# Patient Record
Sex: Female | Born: 1975 | Race: White | Hispanic: No | Marital: Married | Smoking: Former smoker
Health system: Southern US, Community
[De-identification: ages and names within clinical notes are randomized; demographics above are authoritative.]

## PROBLEM LIST (undated history)

## (undated) DIAGNOSIS — K219 Gastro-esophageal reflux disease without esophagitis: Secondary | ICD-10-CM

## (undated) DIAGNOSIS — K760 Fatty (change of) liver, not elsewhere classified: Secondary | ICD-10-CM

## (undated) DIAGNOSIS — I82409 Acute embolism and thrombosis of unspecified deep veins of unspecified lower extremity: Secondary | ICD-10-CM

## (undated) DIAGNOSIS — N200 Calculus of kidney: Secondary | ICD-10-CM

## (undated) DIAGNOSIS — R112 Nausea with vomiting, unspecified: Secondary | ICD-10-CM

## (undated) DIAGNOSIS — N644 Mastodynia: Secondary | ICD-10-CM

## (undated) DIAGNOSIS — R002 Palpitations: Secondary | ICD-10-CM

## (undated) DIAGNOSIS — J301 Allergic rhinitis due to pollen: Secondary | ICD-10-CM

## (undated) DIAGNOSIS — Z9889 Other specified postprocedural states: Secondary | ICD-10-CM

## (undated) HISTORY — PX: BREAST BIOPSY: SHX20

## (undated) HISTORY — DX: Palpitations: R00.2

## (undated) HISTORY — DX: Allergic rhinitis due to pollen: J30.1

## (undated) HISTORY — DX: Calculus of kidney: N20.0

## (undated) HISTORY — PX: TONSILLECTOMY: SUR1361

## (undated) HISTORY — PX: WISDOM TOOTH EXTRACTION: SHX21

## (undated) HISTORY — DX: Mastodynia: N64.4

## (undated) HISTORY — DX: Fatty (change of) liver, not elsewhere classified: K76.0

## (undated) HISTORY — DX: Gastro-esophageal reflux disease without esophagitis: K21.9

## (undated) HISTORY — DX: Acute embolism and thrombosis of unspecified deep veins of unspecified lower extremity: I82.409

---

## 2001-10-04 ENCOUNTER — Inpatient Hospital Stay (HOSPITAL_COMMUNITY): Admission: AD | Admit: 2001-10-04 | Discharge: 2001-10-07 | Payer: Self-pay | Admitting: Obstetrics & Gynecology

## 2002-12-29 ENCOUNTER — Other Ambulatory Visit: Admission: RE | Admit: 2002-12-29 | Discharge: 2002-12-29 | Payer: Self-pay | Admitting: Obstetrics & Gynecology

## 2004-04-06 ENCOUNTER — Inpatient Hospital Stay (HOSPITAL_COMMUNITY): Admission: AD | Admit: 2004-04-06 | Discharge: 2004-04-06 | Payer: Self-pay | Admitting: Obstetrics and Gynecology

## 2004-04-09 ENCOUNTER — Inpatient Hospital Stay (HOSPITAL_COMMUNITY): Admission: AD | Admit: 2004-04-09 | Discharge: 2004-04-10 | Payer: Self-pay | Admitting: Obstetrics & Gynecology

## 2004-04-13 ENCOUNTER — Inpatient Hospital Stay (HOSPITAL_COMMUNITY): Admission: AD | Admit: 2004-04-13 | Discharge: 2004-04-14 | Payer: Self-pay | Admitting: Obstetrics and Gynecology

## 2004-05-20 ENCOUNTER — Other Ambulatory Visit: Admission: RE | Admit: 2004-05-20 | Discharge: 2004-05-20 | Payer: Self-pay | Admitting: Obstetrics and Gynecology

## 2004-08-23 ENCOUNTER — Ambulatory Visit: Payer: Self-pay | Admitting: Internal Medicine

## 2004-10-08 ENCOUNTER — Ambulatory Visit: Payer: Self-pay | Admitting: Family Medicine

## 2004-11-29 ENCOUNTER — Ambulatory Visit: Payer: Self-pay | Admitting: Internal Medicine

## 2005-08-05 ENCOUNTER — Ambulatory Visit: Payer: Self-pay | Admitting: Family Medicine

## 2006-02-08 ENCOUNTER — Emergency Department: Payer: Self-pay | Admitting: Emergency Medicine

## 2006-02-27 ENCOUNTER — Ambulatory Visit: Payer: Self-pay | Admitting: Family Medicine

## 2006-07-12 ENCOUNTER — Emergency Department: Payer: Self-pay | Admitting: Emergency Medicine

## 2006-09-24 ENCOUNTER — Telehealth (INDEPENDENT_AMBULATORY_CARE_PROVIDER_SITE_OTHER): Payer: Self-pay | Admitting: *Deleted

## 2007-03-25 ENCOUNTER — Ambulatory Visit: Payer: Self-pay | Admitting: Gynecology

## 2007-04-15 ENCOUNTER — Ambulatory Visit: Payer: Self-pay | Admitting: Obstetrics & Gynecology

## 2007-04-15 ENCOUNTER — Encounter: Payer: Self-pay | Admitting: Obstetrics & Gynecology

## 2007-05-05 DIAGNOSIS — L259 Unspecified contact dermatitis, unspecified cause: Secondary | ICD-10-CM

## 2007-05-07 ENCOUNTER — Ambulatory Visit: Payer: Self-pay | Admitting: Family Medicine

## 2007-05-14 ENCOUNTER — Ambulatory Visit: Payer: Self-pay | Admitting: Family Medicine

## 2007-05-14 DIAGNOSIS — F17200 Nicotine dependence, unspecified, uncomplicated: Secondary | ICD-10-CM

## 2007-12-19 ENCOUNTER — Emergency Department: Payer: Self-pay | Admitting: Emergency Medicine

## 2008-04-17 ENCOUNTER — Ambulatory Visit: Payer: Self-pay | Admitting: Family Medicine

## 2008-05-05 ENCOUNTER — Ambulatory Visit: Payer: Self-pay | Admitting: Internal Medicine

## 2008-05-05 LAB — CONVERTED CEMR LAB: Rapid Strep: NEGATIVE

## 2008-12-04 ENCOUNTER — Ambulatory Visit: Payer: Self-pay | Admitting: Internal Medicine

## 2008-12-04 DIAGNOSIS — N3000 Acute cystitis without hematuria: Secondary | ICD-10-CM | POA: Insufficient documentation

## 2008-12-04 LAB — CONVERTED CEMR LAB
Nitrite: NEGATIVE
Specific Gravity, Urine: 1.015

## 2009-05-07 ENCOUNTER — Ambulatory Visit: Payer: Self-pay | Admitting: Obstetrics and Gynecology

## 2009-06-28 ENCOUNTER — Ambulatory Visit: Payer: Self-pay | Admitting: Internal Medicine

## 2009-06-28 DIAGNOSIS — R5383 Other fatigue: Secondary | ICD-10-CM

## 2009-06-28 DIAGNOSIS — R5381 Other malaise: Secondary | ICD-10-CM

## 2009-07-02 LAB — CONVERTED CEMR LAB
Alkaline Phosphatase: 59 units/L (ref 39–117)
Basophils Absolute: 0.1 10*3/uL (ref 0.0–0.1)
Basophils Relative: 1 % (ref 0–1)
Creatinine, Ser: 0.71 mg/dL (ref 0.40–1.20)
Eosinophils Absolute: 0.5 10*3/uL (ref 0.0–0.7)
Free T4: 1.43 ng/dL (ref 0.80–1.80)
Glucose, Bld: 73 mg/dL (ref 70–99)
Hemoglobin: 14.6 g/dL (ref 12.0–15.0)
MCHC: 33.9 g/dL (ref 30.0–36.0)
MCV: 90 fL (ref 78.0–100.0)
Monocytes Absolute: 0.7 10*3/uL (ref 0.1–1.0)
Neutro Abs: 4.5 10*3/uL (ref 1.7–7.7)
Neutrophils Relative %: 48 % (ref 43–77)
RDW: 12.2 % (ref 11.5–15.5)
Sodium: 138 meq/L (ref 135–145)
TSH: 1.261 microintl units/mL (ref 0.350–4.500)
Total Bilirubin: 0.2 mg/dL — ABNORMAL LOW (ref 0.3–1.2)
Total Protein: 6.8 g/dL (ref 6.0–8.3)

## 2010-03-08 ENCOUNTER — Encounter: Payer: Self-pay | Admitting: Internal Medicine

## 2010-03-08 ENCOUNTER — Ambulatory Visit
Admission: RE | Admit: 2010-03-08 | Discharge: 2010-03-08 | Payer: Self-pay | Source: Home / Self Care | Attending: Internal Medicine | Admitting: Internal Medicine

## 2010-03-08 DIAGNOSIS — M549 Dorsalgia, unspecified: Secondary | ICD-10-CM | POA: Insufficient documentation

## 2010-03-08 LAB — CONVERTED CEMR LAB
Bilirubin Urine: NEGATIVE
Blood in Urine, dipstick: NEGATIVE
Glucose, Urine, Semiquant: NEGATIVE
Ketones, urine, test strip: NEGATIVE
Nitrite: NEGATIVE
Specific Gravity, Urine: 1.015
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
## 2010-04-11 NOTE — Assessment & Plan Note (Signed)
Summary: LOWER BACK PAIN X 2 WKS CYD   Vital Signs:  Patient profile:   35 year old female Weight:      187 pounds BMI:     30.29 Temp:     98.5 degrees F oral Pulse rate:   78 / minute Pulse rhythm:   regular BP sitting:   110 / 70  (left arm) Cuff size:   regular  Vitals Entered By: Mervin Hack CMA Duncan Dull) (March 08, 2010 11:07 AM) CC: back pain   History of Present Illness: Having bad lower back pain for about 2 weeks On both lumbar sides  and radiates along flank to inguinal areas No known injury or tasks Esp bad if standing for extended time Still painful in bed and in chair kept her up at night last night  No pain radiating into legs no leg weakness trouble straightening up  Quit smoking 3 months ago Things are very different Sleep problems, breast tenderness, mood instability On cycling OCP and periods regular on this  Bowels have been slow Taking miralax which has helped but no improvements  Hasn't really tried any meds  Allergies: 1)  ! Percocet  Past History:  Past medical, surgical, family and social histories (including risk factors) reviewed for relevance to current acute and chronic problems.  Past Medical History: Reviewed history from 12/04/2008 and no changes required. Unremarkable  Past Surgical History: Reviewed history from 12/04/2008 and no changes required. Denies surgical history  Family History: Reviewed history from 06/28/2009 and no changes required. Hypothyroidism is common in family Mat GM with HTN No DM or CAD  Social History: Reviewed history from 06/28/2009 and no changes required. Marital Status: divorced Children: 2 Occupation: Agricultural engineer Current Smoker Alcohol use-rare  Review of Systems       No change in bladder habits not much exercise appetite okay  Physical Exam  General:  alert and normal appearance.   Abdomen:  no inguinal hernia.   Msk:  no spine tenderness pain  area is mid lumbar back bilaterally SLR negative bilat Normal back flexion Fairly normal ROM in hips Neurologic:  strength normal in all extremities and gait normal.     Impression & Recommendations:  Problem # 1:  BACK PAIN (ICD-724.5) Assessment New  clearly seems to be muscular--distribution suggests the obliques discussed heat and regular walking to loosen them up Ibuprofen carisprodol at night as needed   Her updated medication list for this problem includes:    Carisoprodol 350 Mg Tabs (Carisoprodol) .Marland Kitchen... 1/2-1 tab at bedtime as needed for muscle stiffness  Complete Medication List: 1)  Microgestin 1.5/30 1.5-30 Mg-mcg Tabs (Norethindrone acet-ethinyl est) .... As directed 2)  Carisoprodol 350 Mg Tabs (Carisoprodol) .... 1/2-1 tab at bedtime as needed for muscle stiffness  Patient Instructions: 1)  Please try heat on the painful areas 2)  Try ibuprofen 200mg  2-3 tabs three times a day with food till pain is better 3)  Please schedule a follow-up appointment as needed .  Prescriptions: CARISOPRODOL 350 MG TABS (CARISOPRODOL) 1/2-1 tab at bedtime as needed for muscle stiffness  #30 x 0   Entered and Authorized by:   Cindee Salt MD   Signed by:   Cindee Salt MD on 03/08/2010   Method used:   Electronically to        [facility]* (retail)       [address]       [place], [place]  [address]  Ph: [phone]       Fax: [phone]   RxID:   [id number]    Orders Added: 1)  Est. Patient Level III [62952]    Current Allergies (reviewed today): ! PERCOCET  Laboratory Results   Urine Tests  Date/Time Received: March 08, 2010 11:11 AM Date/Time Reported: March 08, 2010 11:11 AM  Routine Urinalysis   Color: yellow Appearance: Hazy Glucose: negative   (Normal Range: Negative) Bilirubin: negative   (Normal Range: Negative) Ketone: negative   (Normal Range: Negative) Spec. Gravity: 1.015   (Normal Range: 1.003-1.035) Blood:  negative   (Normal Range: Negative) pH: 7.0   (Normal Range: 5.0-8.0) Protein: negative   (Normal Range: Negative) Urobilinogen: 0.2   (Normal Range: 0-1) Nitrite: negative   (Normal Range: Negative) Leukocyte Esterace: trace   (Normal Range: Negative)

## 2010-04-11 NOTE — Letter (Signed)
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 [place], [place] [address]   Phone: [phone]  Fax: [phone]    03/08/2010  TO: WHOM IT MAY CONCERN   RE: Ariana Mcdaniel [address] [id number]   The above named individual was seen today and may return to work on: 03/08/2010  If you have any further questions or need additional information, please call.     Sincerely,      Tillman Abide, MD

## 2010-04-15 ENCOUNTER — Ambulatory Visit: Payer: Self-pay | Admitting: Obstetrics & Gynecology

## 2010-04-15 DIAGNOSIS — N644 Mastodynia: Secondary | ICD-10-CM

## 2010-05-31 NOTE — Assessment & Plan Note (Unsigned)
NAMEBaruch Gouty            ACCOUNT NO.:  [id number]  MEDICAL RECORD NO.:  [id number]           PATIENT TYPE:  LOCATION:  [facility]           FACILITY:  PHYSICIAN:  Argentina Donovan, MD             DATE OF BIRTH:  DATE OF SERVICE:  04/15/2010                                 CLINIC NOTE  The patient is a 35 year old.  She is me 35 year old Caucasian female gravida 2, para 2-0-0-2, who is on Janelle birth control pills on continuous basis, taking no breaks.  Recently, she has had onset of mastodynia with sore breasts and itching around the areola especially on it and underneath it.  I think that it probably is related to fluid retention and the itching probably due to some tearing of fibrous tissues under the skin and the epidermis and healing.  There are some dry spots.  I think that she does use oils and that does seem to help. I have told her to give herself a break for the next 3 months, as she would with a regular pill and see if that does not improve things, if it does, she has to think of something an alternative.  Now if she comes off the pill and has severe dysmenorrhea that is not a good trait off even for getting a vasectomy, so we will have to see what her periods are like when she comes off the pill and does the pain and itching go away.  I have examined the breasts and find no dominant masses.  No nipple discharge.  No supraclavicular or axillary nodes and the both breasts are symmetrical.          ______________________________ Argentina Donovan, MD    PR/MEDQ  D:  04/15/2010  T:  04/16/2010  Job:  [id number]

## 2010-07-11 ENCOUNTER — Other Ambulatory Visit: Payer: Self-pay | Admitting: Emergency Medicine

## 2010-07-11 DIAGNOSIS — R110 Nausea: Secondary | ICD-10-CM

## 2010-07-11 DIAGNOSIS — R42 Dizziness and giddiness: Secondary | ICD-10-CM

## 2010-07-15 ENCOUNTER — Ambulatory Visit (INDEPENDENT_AMBULATORY_CARE_PROVIDER_SITE_OTHER): Payer: BC Managed Care – PPO | Admitting: Obstetrics & Gynecology

## 2010-07-15 DIAGNOSIS — N949 Unspecified condition associated with female genital organs and menstrual cycle: Secondary | ICD-10-CM

## 2010-07-23 NOTE — Assessment & Plan Note (Signed)
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  She is  in for her birth control pills and her annual visit, and she is on  Junel, has had no problem with those.  She takes no other medication at  this time and has no other complaints except a small furuncle on the  inner aspect of her left leg that is inflamed, but that has not come to  a head.  She says it never does, and we are going to try her on  doxycycline to see if we can control that.  She has had one on the other  side, which was removed by the dermatologist.  If this continues to  bother her that is what I would suggest.   PHYSICAL EXAMINATION:  NECK:  Supple.  Thyroid not enlarged.  No masses  noted.  HEENT:  Within normal limits.  Pupils are reactive.  LUNGS:  Clear to auscultation and percussion.  HEART:  No murmur.  Normal sinus rhythm.  BREASTS:  Symmetrical.  No dominant masses.  No nipple discharge.  ABDOMEN:  Soft, nontender.  No masses.  No organomegaly.  GENITALIA:  External genitalia is normal.  BUS within normal limits.  Vagina is clean and well rugated.  Cervix clean, parous and the uterus  is of normal size, shape, consistency.  Adnexa could not be palpated.  RECTAL:  Nonrevealing except there are no masses.  EXTREMITIES:  No edema.  No varicosities.  DTRs within normal limits.   IMPRESSION:  Normal physical examination with exception of a small 1-cm  furuncle on the upper aspect of the inner lateral left thigh,  nonfluctuant.  Plan is to place her on doxycycline for 2 weeks b.i.d.  and a prescription to  renew the Junel oral contraceptives.           ______________________________  Argentina Donovan, MD     PR/MEDQ  D:  05/07/2009  T:  05/08/2009  Job:  [id number]

## 2010-07-23 NOTE — Assessment & Plan Note (Signed)
NAMEMARIONETTE, Ariana Mcdaniel             ACCOUNT NO.:  [id number]   MEDICAL RECORD NO.:  [id number]          PATIENT TYPE:  POB   LOCATION:  [facility]         FACILITY:  [facility]   PHYSICIAN:  Elsie Lincoln, MD      DATE OF BIRTH:  09-May-1975   DATE OF SERVICE:  04/15/2007                                  CLINIC NOTE   HISTORY:  The patient comes to the office today for her yearly Pap and  pelvic.  She had her last Pap smear approximately 2 years ago and has  not been told that she has had abnormal.  She was being seen at [facility] ob/gyn and has switched locations as a convenience.  She has been  seriously contemplating having her tubes tied or an IUD inserted.  She  has not made a formal decision and we did spend some significant time  today discussing these options.   She does have two children and has been through a recent divorce.  She  is also working full time and is going to EMT school at night.  She  admits that she is having some emotional issues surrounding her divorce  and some mild difficulties with her husband.  She would like a trial of  Wellbutrin as it has worked for her mother in the past.   PHYSICAL EXAM:  GENERAL:  A well-developed, well-nourished 35 year old  Caucasian female in no acute distress.  VITAL SIGNS:  Blood pressure is 131/76, pulse is 90, weight 171.  Height  is 5 feet 2 inches.  SKIN:  She does have several tattoos.  HEENT:  Head is normocephalic and atraumatic.  Teeth are in good repair.  NECK:  Is supple.  She has no thyromegaly.  CARDIAC:  Regular rate and rhythm.  No murmurs, rubs or thrills.  LUNGS:  Lungs were clear bilaterally.  BREASTS:  Breasts are symmetrical.  There is no axilla lymphadenopathy.  There are no masses appreciated.  We did do education for breast self-  exam.  ABDOMEN:  Abdomen is soft and nontender with positive bowel sounds.  No  organomegaly noted.  PELVIC:  Externally there are no lesions or discharge.  The mons  is  shaved.  Internally there is a slight white nonodorous discharge.  The  cervix is without lesions.  Bimanual exam no cervical motion tenderness.  No adnexal mass.  Nontender uterus.   ASSESSMENT AND PLAN:  1. Well woman exam.  The patient is encouraged to discontinue smoking,      lose weight and work on her emotional difficulties.  2. Contraception.  We did again review the IUD (intrauterine device)      and bilateral tubal ligation.  She is given written as well as oral      information on both of these.  She will remain on her birth control      pills until she has made the decision.  She was given a single pack      of Loestrin as she believes she will be making this decision very      soon.  3. Situational depression.  We will start her on Wellbutrin 300 mg XL  one p.o. daily.  She is asked to take a half      tablet for 3-5 days and then up to the full tablet.  She will      follow up in 6 weeks for evaluation of the Wellbutrin as well as      IUD placement or scheduling of her bilateral tubal ligation.      Remonia Richter, NP    ______________________________  Elsie Lincoln, MD    LR/MEDQ  D:  04/15/2007  T:  04/16/2007  Job:  [id number]

## 2010-08-06 ENCOUNTER — Ambulatory Visit: Payer: Self-pay | Admitting: Obstetrics & Gynecology

## 2010-08-19 ENCOUNTER — Ambulatory Visit: Payer: BC Managed Care – PPO | Admitting: Obstetrics & Gynecology

## 2010-10-21 ENCOUNTER — Ambulatory Visit: Payer: BC Managed Care – PPO | Admitting: Obstetrics and Gynecology

## 2010-11-07 ENCOUNTER — Encounter: Payer: Self-pay | Admitting: Gynecology

## 2010-11-12 ENCOUNTER — Ambulatory Visit: Payer: BC Managed Care – PPO | Admitting: Family Medicine

## 2010-11-12 ENCOUNTER — Encounter: Payer: Self-pay | Admitting: Internal Medicine

## 2010-11-12 ENCOUNTER — Ambulatory Visit (INDEPENDENT_AMBULATORY_CARE_PROVIDER_SITE_OTHER): Payer: BC Managed Care – PPO | Admitting: Internal Medicine

## 2010-11-12 VITALS — BP 117/71 | HR 65 | Temp 98.4°F | Wt 205.0 lb

## 2010-11-12 DIAGNOSIS — J340 Abscess, furuncle and carbuncle of nose: Secondary | ICD-10-CM

## 2010-11-12 DIAGNOSIS — J3489 Other specified disorders of nose and nasal sinuses: Secondary | ICD-10-CM

## 2010-11-12 MED ORDER — FLUCONAZOLE 150 MG PO TABS: 150.0000 mg | ORAL_TABLET | Freq: Once | ORAL | Status: AC

## 2010-11-12 MED ORDER — CLINDAMYCIN HCL 300 MG PO CAPS: 300.0000 mg | ORAL_CAPSULE | Freq: Three times a day (TID) | ORAL | Status: AC

## 2010-11-12 NOTE — Patient Instructions (Signed)
Please take a probiotic like activia yogurt or a probiotic capsule while taking the antibiotics and for a little while afterward

## 2010-11-12 NOTE — Progress Notes (Signed)
  Subjective:    Patient ID: Ariana Mcdaniel, female    DOB: [birth year], 35 y.o.   MRN: [id number]  HPI Has had swelling, pain and burning along right nostril No known pimple No known injury Tylenol some help Seems worse today--throbbing kept her up some Noted first yesterday AM Has tried some heat on it  No fever  Current Outpatient Prescriptions on File Prior to Visit  Medication Sig Dispense Refill  . norethindrone-ethinyl estradiol-iron (MICROGESTIN FE,GILDESS FE,LOESTRIN FE) 1.5-30 MG-MCG tablet Take 1 tablet by mouth daily.          Allergies  Allergen Reactions  . Oxycodone-Acetaminophen     Past Medical History  Diagnosis Date  . Mastodynia   . Hypertension     Past Surgical History  Procedure Date  . Tonsillectomy     Family History  Problem Relation Age of Onset  . Hypertension Maternal Grandmother   . Diabetes Neg Hx   . Heart disease Neg Hx     History   Social History  . Marital Status: Married    Spouse Name: N/A    Number of Children: 2  . Years of Education: N/A   Occupational History  . Courtroom clerk [employer]   Social History Main Topics  . Smoking status: Current Everyday Smoker  . Smokeless tobacco: Never Used  . Alcohol Use: Yes     rare  . Drug Use: No  . Sexually Active: Not on file   Other Topics Concern  . Not on file   Social History Narrative  . No narrative on file   Review of Systems Lots of sneezing this weekend Was in [place], [place] No sig rhinorrhea though    Objective:   Physical Exam  Constitutional: She appears well-developed and well-nourished.  HENT:       Redness, warmth, tenderness and induration noted along the lateral right nare No lesions internally          Assessment & Plan:

## 2010-12-10 ENCOUNTER — Encounter: Payer: Self-pay | Admitting: Family Medicine

## 2010-12-10 ENCOUNTER — Ambulatory Visit (INDEPENDENT_AMBULATORY_CARE_PROVIDER_SITE_OTHER): Payer: BC Managed Care – PPO | Admitting: Family Medicine

## 2010-12-10 VITALS — BP 120/81 | HR 92 | Ht 62.0 in | Wt 206.0 lb

## 2010-12-10 DIAGNOSIS — Z1272 Encounter for screening for malignant neoplasm of vagina: Secondary | ICD-10-CM

## 2010-12-10 DIAGNOSIS — Z01419 Encounter for gynecological examination (general) (routine) without abnormal findings: Secondary | ICD-10-CM

## 2010-12-10 MED ORDER — NORETHIN ACE-ETH ESTRAD-FE 1.5-30 MG-MCG PO TABS: 1.0000 | ORAL_TABLET | Freq: Every day | ORAL | Status: DC

## 2010-12-10 NOTE — Patient Instructions (Signed)

## 2010-12-10 NOTE — Progress Notes (Signed)
  Subjective:     Ariana Mcdaniel is a 35 y.o. female and is here for a comprehensive physical exam. The patient reports no problems.  History   Social History  . Marital Status: Married    Spouse Name: N/A    Number of Children: 2  . Years of Education: N/A   Occupational History  . 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 Smoking status: Former Smoker    Quit date: 01/04/2010  . Smokeless tobacco: Never Used  . Alcohol Use: 1.2 oz/week    2 Cans of beer per week     rare  . Drug Use: No  . Sexually Active: Yes -- Female partner(s)   Other Topics Concern  . Not on file   Social History Narrative   2nd marriage   Health Maintenance  Topic Date Due  . Pap Smear  01/31/1996  . Tetanus/tdap  01/30/1997  . Influenza Vaccine  12/09/2010    The following portions of the patient's history were reviewed and updated as appropriate: allergies, current medications, past family history, past medical history, past social history, past surgical history and problem list.  Review of Systems A comprehensive review of systems was negative.   Objective:    BP 120/81  Pulse 92  Ht 5\' 2"  (1.575 m)  Wt 206 lb (93.441 kg)  BMI 37.68 kg/m2  LMP 11/26/2010 General appearance: alert, cooperative and appears stated age Head: Normocephalic, without obvious abnormality, atraumatic Neck: no adenopathy, supple, symmetrical, trachea midline and thyroid not enlarged, symmetric, no tenderness/mass/nodules Lungs: clear to auscultation bilaterally Breasts: normal appearance, no masses or tenderness Heart: regular rate and rhythm, S1, S2 normal, no murmur, click, rub or gallop Abdomen: soft, non-tender; bowel sounds normal; no masses,  no organomegaly Pelvic: cervix normal in appearance, external genitalia normal, no adnexal masses or tenderness, no cervical motion tenderness, uterus normal size, shape, and consistency and vagina normal without discharge Extremities:  extremities normal, atraumatic, no cyanosis or edema Pulses: 2+ and symmetric Skin: Skin color, texture, turgor normal. No rashes or lesions Lymph nodes: Cervical, supraclavicular, and axillary nodes normal. Neurologic: Grossly normal    Assessment:    Healthy female exam.      Plan:  Pap smear Healthy lifestyle. Recent blood work with PCP   See After Visit Summary for Counseling Recommendations

## 2011-02-07 ENCOUNTER — Ambulatory Visit (INDEPENDENT_AMBULATORY_CARE_PROVIDER_SITE_OTHER): Payer: BC Managed Care – PPO | Admitting: Family Medicine

## 2011-02-07 ENCOUNTER — Encounter: Payer: Self-pay | Admitting: Family Medicine

## 2011-02-07 VITALS — BP 110/78 | HR 80 | Temp 98.9°F | Wt 213.5 lb

## 2011-02-07 DIAGNOSIS — J019 Acute sinusitis, unspecified: Secondary | ICD-10-CM | POA: Insufficient documentation

## 2011-02-07 MED ORDER — AMOXICILLIN-POT CLAVULANATE 875-125 MG PO TABS: 1.0000 | ORAL_TABLET | Freq: Two times a day (BID) | ORAL | Status: AC

## 2011-02-07 NOTE — Patient Instructions (Signed)
You have a sinus infection. Take medicine as prescribed:augmentin Push fluids and plenty of rest. Nasal saline irrigation or neti pot to help drain sinuses. May use simple mucinex with plenty of fluid to help mobilize mucous. Let us know if fever >101.5, trouble opening/closing mouth, difficulty swallowing, or worsening - you may need to be seen again.

## 2011-02-07 NOTE — Assessment & Plan Note (Signed)
Going on 3-4 wks.  Treat with augmentin. Red flags to return discussed.

## 2011-02-07 NOTE — Progress Notes (Signed)
  Subjective:    Patient ID: Ariana Mcdaniel, female    DOB: 1975/07/06, 35 y.o.   MRN: [id number]  HPI CC: "I have the crud"  sxs going on for 3-4 wks.  ? Sinus infection.  Started with significant maxillary sinus pressure, progressed to post nasal drainage, now with chest congestion.  Yesterday head congestion/sinus pressure HA returned.  + ST.  Not improving.  + ear pain  So far has tried OTC sinus med, decongestant  No fevers/chills, abd pain, n/v, rashes, tooth pain.  No sick contacts at home.  No smokers at home.  No h/o asthma/copd.  Tends to get recurrent sinus infections.  Review of Systems Per HPI    Objective:   Physical Exam  Nursing note and vitals reviewed. Constitutional: She appears well-developed and well-nourished. No distress.  HENT:  Head: Normocephalic and atraumatic.  Right Ear: Hearing, external ear and ear canal normal.  Left Ear: Hearing, external ear and ear canal normal.  Nose: No mucosal edema or rhinorrhea. Right sinus exhibits no maxillary sinus tenderness and no frontal sinus tenderness. Left sinus exhibits maxillary sinus tenderness. Left sinus exhibits no frontal sinus tenderness.  Mouth/Throat: Uvula is midline, oropharynx is clear and moist and mucous membranes are normal. No oropharyngeal exudate, posterior oropharyngeal edema, posterior oropharyngeal erythema or tonsillar abscesses.       R>L TM congestion +PNDrainage  Eyes: Conjunctivae and EOM are normal. Pupils are equal, round, and reactive to light. No scleral icterus.  Neck: Normal range of motion. Neck supple. No JVD present. No thyromegaly present.  Cardiovascular: Normal rate, regular rhythm, normal heart sounds and intact distal pulses.   No murmur heard. Pulmonary/Chest: Effort normal and breath sounds normal. No respiratory distress. She has no wheezes. She has no rales.  Lymphadenopathy:    She has no cervical adenopathy.  Skin: Skin is warm and dry. No rash noted.        Assessment & Plan:

## 2011-05-26 ENCOUNTER — Emergency Department (INDEPENDENT_AMBULATORY_CARE_PROVIDER_SITE_OTHER): Payer: BC Managed Care – PPO

## 2011-05-26 ENCOUNTER — Emergency Department (HOSPITAL_BASED_OUTPATIENT_CLINIC_OR_DEPARTMENT_OTHER)
Admission: EM | Admit: 2011-05-26 | Discharge: 2011-05-26 | Disposition: A | Discharge status: Home Health | Payer: BC Managed Care – PPO | Attending: Emergency Medicine | Admitting: Emergency Medicine

## 2011-05-26 ENCOUNTER — Encounter (HOSPITAL_BASED_OUTPATIENT_CLINIC_OR_DEPARTMENT_OTHER): Payer: Self-pay | Admitting: *Deleted

## 2011-05-26 DIAGNOSIS — R51 Headache: Secondary | ICD-10-CM | POA: Insufficient documentation

## 2011-05-26 DIAGNOSIS — S0990XA Unspecified injury of head, initial encounter: Secondary | ICD-10-CM

## 2011-05-26 DIAGNOSIS — M542 Cervicalgia: Secondary | ICD-10-CM | POA: Insufficient documentation

## 2011-05-26 DIAGNOSIS — H9209 Otalgia, unspecified ear: Secondary | ICD-10-CM | POA: Insufficient documentation

## 2011-05-26 NOTE — Discharge Instructions (Signed)
Return to the ED with any concerns including vomiting, seizure activity, worsening headache, decreased level of alertness or lethargy, or any other alarming symptoms.
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
## 2011-05-26 NOTE — ED Notes (Signed)
MD at bedside. 
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
## 2011-05-27 ENCOUNTER — Ambulatory Visit: Payer: BC Managed Care – PPO | Admitting: Family Medicine

## 2011-06-09 ENCOUNTER — Other Ambulatory Visit: Payer: Self-pay | Admitting: Gynecology

## 2011-07-15 ENCOUNTER — Encounter: Payer: Self-pay | Admitting: Family Medicine

## 2011-07-15 ENCOUNTER — Ambulatory Visit (INDEPENDENT_AMBULATORY_CARE_PROVIDER_SITE_OTHER): Payer: BC Managed Care – PPO | Admitting: Family Medicine

## 2011-07-15 VITALS — BP 112/70 | HR 80 | Temp 98.4°F | Ht 62.0 in | Wt 212.0 lb

## 2011-07-15 DIAGNOSIS — J029 Acute pharyngitis, unspecified: Secondary | ICD-10-CM | POA: Insufficient documentation

## 2011-07-15 DIAGNOSIS — J069 Acute upper respiratory infection, unspecified: Secondary | ICD-10-CM | POA: Insufficient documentation

## 2011-07-15 LAB — POCT RAPID STREP A (OFFICE): Rapid Strep A Screen: NEGATIVE

## 2011-07-15 NOTE — Progress Notes (Signed)
Subjective:    Patient ID: Ariana Mcdaniel, female    DOB: 1975-12-27, 36 y.o.   MRN: [id number]  HPI Her with a sore throat today  Started on Sunday  Also uri symptoms - nose cong and swollen glands and also ears hurt   Worse st on L side - to the ear  Not getting much out of nose  Some post nasal drip No cough  No fever No chills/aches - but had sweat last night  No rash  No strep exp  ? Exp to uris- works at the [employer]   Using day quil - not helping tremendously  Did take tylenol today - helped a bit   Has to get on a plane on Sunday -worried about her ears  Patient Active Problem List  Diagnoses  . Sinusitis acute  . Sore throat   Past Medical History  Diagnosis Date  . Mastodynia    Past Surgical History  Procedure Date  . Tonsillectomy   . Wisdom tooth extraction    History  Substance Use Topics  . Smoking status: Former Smoker    Quit date: 01/04/2010  . Smokeless tobacco: Never Used  . Alcohol Use: 1.2 oz/week    2 Cans of beer per week     rare   Family History  Problem Relation Age of Onset  . Hypertension Maternal Grandmother   . Diabetes Maternal Grandmother   . Heart disease Neg Hx   . Depression Paternal Grandmother    Allergies  Allergen Reactions  . Oxycodone-Acetaminophen Hives   Current Outpatient Prescriptions on File Prior to Visit  Medication Sig Dispense Refill  . acetaminophen (TYLENOL) 500 MG tablet Take 1,000 mg by mouth every 6 (six) hours as needed. Patient used this medication for her headaches.      . norethindrone-ethinyl estradiol-iron (MICROGESTIN FE,GILDESS FE,LOESTRIN FE) 1.5-30 MG-MCG tablet Take 1 tablet by mouth daily.  1 Package  11          Review of Systems Review of Systems  Constitutional: Negative for fever, appetite change,  and unexpected weight change.  ENT pos for St and also post nasal drip/ neg for sinus pain  Eyes: Negative for pain and visual disturbance.  Respiratory: Negative for cough  and shortness of breath.   Cardiovascular: Negative for cp or palpitations    Gastrointestinal: Negative for nausea, diarrhea and constipation.  Genitourinary: Negative for urgency and frequency.  Skin: Negative for pallor or rash   Neurological: Negative for weakness, light-headedness, numbness and headaches.  Hematological: Negative for adenopathy. Does not bruise/bleed easily.  Psychiatric/Behavioral: Negative for dysphoric mood. The patient is not nervous/anxious.         Objective:   Physical Exam  Constitutional: She appears well-developed and well-nourished. No distress.       Obese and well appearing  HENT:  Head: Normocephalic and atraumatic.  Right Ear: External ear normal.  Left Ear: External ear normal.  Mouth/Throat: Oropharynx is clear and moist. No oropharyngeal exudate.       Nares mildly congested  No sinus tenderness TMs clear Clear post nasal drip    Eyes: Conjunctivae and EOM are normal. Pupils are equal, round, and reactive to light. Right eye exhibits no discharge. Left eye exhibits no discharge.  Neck: Normal range of motion. Neck supple.  Cardiovascular: Normal rate and regular rhythm.   Pulmonary/Chest: Effort normal and breath sounds normal. No respiratory distress. She has no wheezes.  Lymphadenopathy:    She has no cervical  adenopathy.  Skin: Skin is warm and dry. No rash noted. No erythema. No pallor.  Psychiatric: She has a normal mood and affect.          Assessment & Plan:

## 2011-07-15 NOTE — Patient Instructions (Addendum)
Drink lots of fluids Gargle with salt water and try chloraseptic spray for sore throat  An antihistamine like zyrtec may help drip  Tylenol or ibuprofen ok for pain / inflammation For airplane- use afrin nasal spray 15 min prior to take off and landing  Can try sudafed if you want to  This is viral and will need to run its course

## 2011-07-15 NOTE — Assessment & Plan Note (Signed)
With sore throat and post nasal drip and ETD Disc symptomatic care - see instructions on AVS  Recommend afrin ns (with warnings ) for airplane  Update if not starting to improve in a week or if worsening

## 2012-03-09 ENCOUNTER — Ambulatory Visit (INDEPENDENT_AMBULATORY_CARE_PROVIDER_SITE_OTHER): Payer: BC Managed Care – PPO | Admitting: Family Medicine

## 2012-03-09 VITALS — BP 112/74 | HR 74 | Temp 98.6°F | Resp 18 | Ht 63.5 in | Wt 213.0 lb

## 2012-03-09 DIAGNOSIS — K0889 Other specified disorders of teeth and supporting structures: Secondary | ICD-10-CM

## 2012-03-09 DIAGNOSIS — K089 Disorder of teeth and supporting structures, unspecified: Secondary | ICD-10-CM

## 2012-03-09 MED ORDER — CLARITHROMYCIN 500 MG PO TABS: 500.0000 mg | ORAL_TABLET | Freq: Two times a day (BID) | ORAL | Status: DC

## 2012-03-09 MED ORDER — AMOXICILLIN-POT CLAVULANATE 875-125 MG PO TABS: 1.0000 | ORAL_TABLET | Freq: Two times a day (BID) | ORAL | Status: DC

## 2012-03-09 NOTE — Patient Instructions (Addendum)
Use the antibiotic, and be sure to rinse your mouth after eating to keep everything clean.  Let me know if you are not better or if you get worse

## 2012-03-09 NOTE — Progress Notes (Signed)
[facility] [address] [place] [address] [phone]  Date:  03/09/2012   Name:  Ariana Mcdaniel   DOB:  1975/04/18   MRN:  [id number]  PCP:  Roxy Manns, MD    Chief Complaint: Dental Pain and Sinus Problem   History of Present Illness:  Ariana Mcdaniel is a 36 y.o. very pleasant female patient who presents with the following:  She is here today with a toothache- she has noted pain in a tooth for about 6 weeks- she has not been able to go to the dentist due to work.  She did call her dentist but they are closed until monday  She has a history of frequent sinus infections.  She has also noted pain in her right sinuses for about 6 weeks, but is not sure if this might have to do with her tooth pain  No fever.  No cough, no body aches, no chills No ST   She is using tylenol and ibuprofen for pain and this is helping.    LMP 02/16/12- method of contraception is vasectomy  She is otherwise generally healthy.  She knows she has not kept up with her teeth as well as she should as she is afraid of the dentist Patient Active Problem List  Diagnosis  . Sinusitis acute  . Sore throat  . Viral URI    Past Medical History  Diagnosis Date  . Mastodynia     Past Surgical History  Procedure Date  . Tonsillectomy   . Wisdom tooth extraction     History  Substance Use Topics  . Smoking status: Former Smoker    Quit date: 01/04/2010  . Smokeless tobacco: Never Used  . Alcohol Use: 1.2 oz/week    2 Cans of beer per week     Comment: rare    Family History  Problem Relation Age of Onset  . Hypertension Maternal Grandmother   . Diabetes Maternal Grandmother   . Heart disease Neg Hx   . Depression Paternal Grandmother     Allergies  Allergen Reactions  . Oxycodone-Acetaminophen Hives    Medication list has been reviewed and updated.  Current Outpatient Prescriptions on File Prior to Visit  Medication Sig Dispense Refill  . acetaminophen  (TYLENOL) 500 MG tablet Take 1,000 mg by mouth every 6 (six) hours as needed. Patient used this medication for her headaches.      . norethindrone-ethinyl estradiol-iron (MICROGESTIN FE,GILDESS FE,LOESTRIN FE) 1.5-30 MG-MCG tablet Take 1 tablet by mouth daily.  1 Package  11    Review of Systems:  As per HPI- otherwise negative.   Physical Examination: Filed Vitals:   03/09/12 1003  BP: 112/74  Pulse: 74  Temp: 98.6 F (37 C)  Resp: 18   Filed Vitals:   03/09/12 1003  Height: 5' 3.5" (1.613 m)  Weight: 213 lb (96.616 kg)   Body mass index is 37.14 kg/(m^2). Ideal Body Weight: Weight in (lb) to have BMI = 25: 143.1   GEN: WDWN, NAD, Non-toxic, A & O x 3, overweight HEENT: Atraumatic, Normocephalic. Neck supple. No masses, No LAD. Bilateral TM wnl, oropharynx normal.  PEERL,EOMI.   There is a broken rear molar in the right mandible- the surrounding gums are slightly inflamed but no fluctuance or abscess.  She identifies this as the painful tooth.  Several other teeth need repair Ears and Nose: No external deformity. CV: RRR, No M/G/R. No JVD. No thrill. No extra heart  sounds. PULM: CTA B, no wheezes, crackles, rhonchi. No retractions. No resp. distress. No accessory muscle use. EXTR: No c/c/e NEURO Normal gait.  PSYCH: Normally interactive. Conversant. Not depressed or anxious appearing.  Calm demeanor.    Assessment and Plan: 1. Toothache  clarithromycin (BIAXIN) 500 MG tablet, DISCONTINUED: amoxicillin-clavulanate (AUGMENTIN) 875-125 MG per tablet   Broken and infected tooth.  She will call her dentist and schedule an appt as soon as possible.   She reports an allergy to amoxicillin, and clindamycin caused some sort of superinfection.  Will treat with clarithromycin 500 BID for 10 days.  Let me know if not getting better- See patient instructions for more details.    Meds ordered this encounter  Medications  . DISCONTD: amoxicillin-clavulanate (AUGMENTIN) 875-125 MG per  tablet    Sig: Take 1 tablet by mouth 2 (two) times daily.    Dispense:  20 tablet    Refill:  0  . clarithromycin (BIAXIN) 500 MG tablet    Sig: Take 1 tablet (500 mg total) by mouth 2 (two) times daily.    Dispense:  20 tablet    Refill:  0   augmentin canceled due to allergy- biaxin instead.  Clarified with pharmacist.     Abbe Amsterdam, MD

## 2012-06-28 ENCOUNTER — Ambulatory Visit: Payer: BC Managed Care – PPO | Admitting: Obstetrics & Gynecology

## 2012-07-05 ENCOUNTER — Encounter: Payer: Self-pay | Admitting: Internal Medicine

## 2012-07-05 ENCOUNTER — Ambulatory Visit (INDEPENDENT_AMBULATORY_CARE_PROVIDER_SITE_OTHER): Payer: BC Managed Care – PPO | Admitting: Internal Medicine

## 2012-07-05 ENCOUNTER — Encounter: Payer: Self-pay | Admitting: *Deleted

## 2012-07-05 VITALS — BP 108/80 | HR 71 | Temp 98.5°F | Wt 218.5 lb

## 2012-07-05 DIAGNOSIS — J301 Allergic rhinitis due to pollen: Secondary | ICD-10-CM | POA: Insufficient documentation

## 2012-07-05 MED ORDER — FLUTICASONE PROPIONATE 50 MCG/ACT NA SUSP: 2.0000 | Freq: Every day | NASAL | Status: DC

## 2012-07-05 NOTE — Progress Notes (Signed)
  Subjective:    Patient ID: Ariana Mcdaniel, female    DOB: 09/23/1975, 37 y.o.   MRN: [id number]  HPI Having bad allergy problems Has tried "everything" over the counter--they sedate her Loratadine, cetirizine and fexofenadine have helped but mostly put her to sleep Tired with singulair also  Eye symptoms--bad itching and tearing Pounding frontal pain No ear problems  Hasn't used nasal steroids but saline makes her gag  No wheezing Some raspy feeling with breathing today (no overt SOB)  Current Outpatient Prescriptions on File Prior to Visit  Medication Sig Dispense Refill  . acetaminophen (TYLENOL) 500 MG tablet Take 1,000 mg by mouth every 6 (six) hours as needed. Patient used this medication for her headaches.       No current facility-administered medications on file prior to visit.    Allergies  Allergen Reactions  . Amoxicillin     itching  . Clindamycin/Lincomycin     Caused a superinfection  . Oxycodone-Acetaminophen Hives    Past Medical History  Diagnosis Date  . Mastodynia     Past Surgical History  Procedure Laterality Date  . Tonsillectomy    . Wisdom tooth extraction      Family History  Problem Relation Age of Onset  . Hypertension Maternal Grandmother   . Diabetes Maternal Grandmother   . Heart disease Neg Hx   . Depression Paternal Grandmother     History   Social History  . Marital Status: Married    Spouse Name: N/A    Number of Children: 2  . Years of Education: N/A   Occupational History  . Courtroom clerk [employer]   Social History Main Topics  . Smoking status: Former Smoker    Quit date: 01/04/2010  . Smokeless tobacco: Never Used  . Alcohol Use: 1.2 oz/week    2 Cans of beer per week     Comment: rare  . Drug Use: No  . Sexually Active: Yes -- Female partner(s)    Birth Control/ Protection: None   Other Topics Concern  . Not on file   Social History Narrative   2nd marriage   Review of Systems No fever Some  sneezing--no coughing    Objective:   Physical Exam        Assessment & Plan:

## 2012-07-05 NOTE — Patient Instructions (Signed)
Please start the fluticasone nasal spray twice a day for the first week. If you are better, you can cut back to just once a day. Try an over the counter allergy eye drop like naphcon-A for your eye symptoms

## 2012-07-05 NOTE — Assessment & Plan Note (Signed)
Rx is difficult due to sedation with multiple meds Year long problems Will try fluticasone spray and OTC eye drops If ongoing problems, will recommend allergy evaluation to consider immunotherapy

## 2012-07-12 ENCOUNTER — Ambulatory Visit (INDEPENDENT_AMBULATORY_CARE_PROVIDER_SITE_OTHER): Payer: BC Managed Care – PPO | Admitting: Obstetrics & Gynecology

## 2012-07-12 ENCOUNTER — Encounter: Payer: Self-pay | Admitting: Obstetrics & Gynecology

## 2012-07-12 VITALS — BP 115/75 | HR 73 | Ht 62.0 in | Wt 220.0 lb

## 2012-07-12 DIAGNOSIS — Z0000 Encounter for general adult medical examination without abnormal findings: Secondary | ICD-10-CM

## 2012-07-12 DIAGNOSIS — N920 Excessive and frequent menstruation with regular cycle: Secondary | ICD-10-CM

## 2012-07-12 DIAGNOSIS — Z124 Encounter for screening for malignant neoplasm of cervix: Secondary | ICD-10-CM

## 2012-07-12 DIAGNOSIS — Z01419 Encounter for gynecological examination (general) (routine) without abnormal findings: Secondary | ICD-10-CM

## 2012-07-12 DIAGNOSIS — Z1151 Encounter for screening for human papillomavirus (HPV): Secondary | ICD-10-CM

## 2012-07-12 LAB — CBC
HCT: 39.8 % (ref 36.0–46.0)
Hemoglobin: 13.7 g/dL (ref 12.0–15.0)
MCV: 83.8 fL (ref 78.0–100.0)
RDW: 12.7 % (ref 11.5–15.5)
WBC: 9.8 10*3/uL (ref 4.0–10.5)

## 2012-07-12 NOTE — Progress Notes (Signed)
Subjective:    Ariana Mcdaniel is a 37 y.o. female who presents for an annual exam. The patient has no complaints today. Her periods are becoming heavier over the last 6 months, lasting 4 days.  The patient is sexually active. GYN screening history: last pap: was normal. The patient wears seatbelts: yes. The patient participates in regular exercise: no. Has the patient ever been transfused or tattooed?: yes. (tattoos) The patient reports that there is not domestic violence in her life.   Menstrual History: OB History   Grav Para Term Preterm Abortions TAB SAB Ect Mult Living   2 2        2       Menarche age: 25  Patient's last menstrual period was 07/01/2012.    The following portions of the patient's history were reviewed and updated as appropriate: allergies, current medications, past family history, past medical history, past social history, past surgical history and problem list.  Review of Systems A comprehensive review of systems was negative. She has been married for 2 years (second marriage). Her husband has had a vasectomy. She denies dyspareunia. She is a Solicitor in a court.   Objective:    BP 115/75  Pulse 73  Ht 5\' 2"  (1.575 m)  Wt 220 lb (99.791 kg)  BMI 40.23 kg/m2  LMP 07/01/2012  General Appearance:    Alert, cooperative, no distress, appears stated age  Head:    Normocephalic, without obvious abnormality, atraumatic  Eyes:    PERRL, conjunctiva/corneas clear, EOM's intact, fundi    benign, both eyes  Ears:    Normal TM's and external ear canals, both ears  Nose:   Nares normal, septum midline, mucosa normal, no drainage    or sinus tenderness  Throat:   Lips, mucosa, and tongue normal; teeth and gums normal  Neck:   Supple, symmetrical, trachea midline, no adenopathy;    thyroid:  no enlargement/tenderness/nodules; no carotid   bruit or JVD  Back:     Symmetric, no curvature, ROM normal, no CVA tenderness  Lungs:     Clear to auscultation bilaterally, respirations  unlabored  Chest Wall:    No tenderness or deformity   Heart:    Regular rate and rhythm, S1 and S2 normal, no murmur, rub   or gallop  Breast Exam:    No tenderness, masses, or nipple abnormality  Abdomen:     Soft, non-tender, bowel sounds active all four quadrants,    no masses, no organomegaly  Genitalia:    Normal female without lesion, discharge or tenderness, NSSA, NT, normal adnexal exam     Extremities:   Extremities normal, atraumatic, no cyanosis or edema  Pulses:   2+ and symmetric all extremities  Skin:   Skin color, texture, turgor normal, no rashes or lesions  Lymph nodes:   Cervical, supraclavicular, and axillary nodes normal  Neurologic:   CNII-XII intact, normal strength, sensation and reflexes    throughout  .    Assessment:    Healthy female exam.  menorrhagia   Plan:     Thin prep Pap smear. TSH, CBC

## 2012-07-12 NOTE — Progress Notes (Signed)
Here today for yearly gyn exam. No concerns. Having very heavy periods x 6 months.

## 2012-07-30 ENCOUNTER — Ambulatory Visit: Payer: BC Managed Care – PPO | Admitting: Obstetrics & Gynecology

## 2012-08-20 ENCOUNTER — Encounter: Payer: Self-pay | Admitting: Obstetrics & Gynecology

## 2012-08-20 ENCOUNTER — Ambulatory Visit (INDEPENDENT_AMBULATORY_CARE_PROVIDER_SITE_OTHER): Payer: BC Managed Care – PPO | Admitting: Obstetrics & Gynecology

## 2012-08-20 VITALS — BP 103/74 | HR 72 | Resp 16 | Ht 62.0 in | Wt 221.0 lb

## 2012-08-20 DIAGNOSIS — E669 Obesity, unspecified: Secondary | ICD-10-CM

## 2012-08-20 DIAGNOSIS — N920 Excessive and frequent menstruation with regular cycle: Secondary | ICD-10-CM

## 2012-08-20 DIAGNOSIS — Z139 Encounter for screening, unspecified: Secondary | ICD-10-CM

## 2012-08-20 NOTE — Progress Notes (Signed)
  Subjective:    Patient ID: Ariana Mcdaniel, female    DOB: 1975-11-22, 37 y.o.   MRN: [id number]  HPI  37 yo who is here for f/u of w/u for menorrhagia. Her pap/HPV were normal, TSH and HBG normal (13.7). She says that her period since that visit was very light. Her husband had a vasectomy because she didn't want to be on birth control any longer. "Not good at taking pills". She wants to lose weight, has eliminated sodas. She only drinks Lipton green tea and water and still has gained weight. She is doing cardio daily. Review of Systems     Objective:   Physical Exam        Assessment & Plan:  Menorrhagia- normal w/u so far. I have offered u/s, OCPs, Mirena if she wants. She will let me know. Pap recs- I have discussed ACOG recs, but still offered to do annual paps if she wants. Weight-TSH normal. Referral to [facility]

## 2012-08-21 LAB — LIPID PANEL
LDL Cholesterol: 112 mg/dL — ABNORMAL HIGH (ref 0–99)
Total CHOL/HDL Ratio: 3.3 Ratio
Triglycerides: 100 mg/dL (ref <150)
VLDL: 20 mg/dL (ref 0–40)

## 2012-09-02 ENCOUNTER — Encounter: Payer: BC Managed Care – PPO | Admitting: Obstetrics & Gynecology

## 2013-01-13 ENCOUNTER — Other Ambulatory Visit: Payer: Self-pay

## 2013-06-14 ENCOUNTER — Ambulatory Visit (INDEPENDENT_AMBULATORY_CARE_PROVIDER_SITE_OTHER): Payer: BC Managed Care – PPO | Admitting: Family Medicine

## 2013-06-14 ENCOUNTER — Ambulatory Visit: Payer: BC Managed Care – PPO

## 2013-06-14 VITALS — BP 119/83 | HR 83 | Temp 99.0°F | Resp 18 | Wt 229.0 lb

## 2013-06-14 DIAGNOSIS — M25571 Pain in right ankle and joints of right foot: Secondary | ICD-10-CM

## 2013-06-14 DIAGNOSIS — M25579 Pain in unspecified ankle and joints of unspecified foot: Secondary | ICD-10-CM

## 2013-06-14 DIAGNOSIS — S93401A Sprain of unspecified ligament of right ankle, initial encounter: Secondary | ICD-10-CM

## 2013-06-14 NOTE — Progress Notes (Signed)
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  Objective: No acute distress Right ankle reveals moderate swelling and tenderness over the distal fibula and anterior to the fibular prominence of the lateral malleolus.   [facility] reading (PRIMARY) by  Dr. Joseph Art:  Right ankle STS only  Right ankle pain - Plan: DG Ankle Complete Right  Right ankle sprain ASO splint for now  Signed, Robyn Haber, MD    .

## 2013-06-16 ENCOUNTER — Ambulatory Visit: Payer: BC Managed Care – PPO | Admitting: Podiatry

## 2013-08-22 ENCOUNTER — Encounter: Payer: Self-pay | Admitting: Internal Medicine

## 2013-08-22 ENCOUNTER — Ambulatory Visit (INDEPENDENT_AMBULATORY_CARE_PROVIDER_SITE_OTHER): Payer: BC Managed Care – PPO | Admitting: Internal Medicine

## 2013-08-22 VITALS — BP 118/72 | HR 103 | Temp 98.7°F | Ht 62.5 in | Wt 228.8 lb

## 2013-08-22 DIAGNOSIS — J209 Acute bronchitis, unspecified: Secondary | ICD-10-CM

## 2013-08-22 MED ORDER — AZITHROMYCIN 250 MG PO TABS: ORAL_TABLET | ORAL | Status: DC

## 2013-08-22 NOTE — Progress Notes (Signed)
HPI  Pt presents to the clinic today with c/o sore throat, cough and chest congestions. This started 4 days ago. She reports she has also run fevers up to 102. The cough is productive of thick beige sputum. She does feel short of breath. She has tried Ibuprofen and Mucinex without relief. She does have a history of allergies. She takes zyrtec and flonase for that. She has not had sick contacts that she is aware of.   Review of Systems      Past Medical History  Diagnosis Date  . Mastodynia   . Allergic rhinitis due to pollen     Family History  Problem Relation Age of Onset  . Hypertension Maternal Grandmother   . Diabetes Maternal Grandmother   . Heart disease Neg Hx   . Depression Paternal Grandmother     History   Social History  . Marital Status: Married    Spouse Name: N/A    Number of Children: 2  . Years of Education: N/A   Occupational History  . [place] History Main Topics  . Smoking status: Former Smoker    Quit date: 01/04/2010  . Smokeless tobacco: Never Used  . Alcohol Use: 1.2 oz/week    2 Cans of beer per week     Comment: rare  . Drug Use: No  . Sexual Activity: Yes    Partners: Male    Birth Control/ Protection: None   Other Topics Concern  . Not on file   Social History Narrative   2nd marriage    Allergies  Allergen Reactions  . Amoxicillin     itching  . Clindamycin/Lincomycin     Caused a superinfection  . Oxycodone-Acetaminophen Hives     Constitutional: Positive headache, fatigue and fever. Denies abrupt weight changes.  HEENT:  Positive sore throat. Denies eye redness, eye pain, pressure behind the eyes, facial pain, nasal congestion, ear pain, ringing in the ears, wax buildup, runny nose or bloody nose. Respiratory: Positive cough. Denies difficulty breathing or shortness of breath.  Cardiovascular: Denies chest pain, chest tightness, palpitations or swelling in the hands or feet.   No other  specific complaints in a complete review of systems (except as listed in HPI above).  Objective:   BP 118/72  Pulse 103  Temp(Src) 98.7 F (37.1 C) (Oral)  Ht 5' 2.5" (1.588 m)  Wt 228 lb 12 oz (103.76 kg)  BMI 41.15 kg/m2  SpO2 98%  LMP 08/06/2013 Wt Readings from Last 3 Encounters:  08/22/13 228 lb 12 oz (103.76 kg)  06/14/13 229 lb (103.874 kg)  08/20/12 221 lb (100.245 kg)     General: Appears her stated age, well developed, well nourished in NAD. HEENT: Head: normal shape and size; Eyes: sclera white, no icterus, conjunctiva pink, PERRLA and EOMs intact; Ears: Tm's gray and intact, normal light reflex; Nose: mucosa pink and moist, septum midline; Throat/Mouth: + PND. Teeth present, mucosa erythematous and moist, no exudate noted, no lesions or ulcerations noted.  Neck: Mild cervical lymphadenopathy. Neck supple, trachea midline. No massses, lumps or thyromegaly present.  Cardiovascular: Normal rate and rhythm. S1,S2 noted.  No murmur, rubs or gallops noted. No JVD or BLE edema. No carotid bruits noted. Pulmonary/Chest: Normal effort and rhonchi noted in the RLL. No respiratory distress. No wheezes, rales noted.      Assessment & Plan:  Acute Bronchitis:  Get some rest and drink plenty of water Do salt water gargles for the  sore throat eRx for Azithromax x 5 days Delsym OTC for cough  RTC as needed or if symptoms persist.

## 2013-08-22 NOTE — Patient Instructions (Addendum)

## 2013-08-22 NOTE — Progress Notes (Signed)
Pre visit review using our clinic review tool, if applicable. No additional management support is needed unless otherwise documented below in the visit note. 

## 2013-08-24 ENCOUNTER — Encounter: Payer: Self-pay | Admitting: *Deleted

## 2013-08-24 ENCOUNTER — Ambulatory Visit (INDEPENDENT_AMBULATORY_CARE_PROVIDER_SITE_OTHER): Payer: BC Managed Care – PPO | Admitting: Family Medicine

## 2013-08-24 ENCOUNTER — Encounter: Payer: Self-pay | Admitting: Family Medicine

## 2013-08-24 VITALS — BP 120/80 | HR 106 | Temp 98.6°F | Wt 227.0 lb

## 2013-08-24 DIAGNOSIS — J189 Pneumonia, unspecified organism: Secondary | ICD-10-CM

## 2013-08-24 DIAGNOSIS — J181 Lobar pneumonia, unspecified organism: Principal | ICD-10-CM

## 2013-08-24 MED ORDER — LEVOFLOXACIN 500 MG PO TABS: 500.0000 mg | ORAL_TABLET | Freq: Every day | ORAL | Status: DC

## 2013-08-24 NOTE — Progress Notes (Signed)
   [place] [address] Phone: [phone] Fax: [phone]  Patient ID: Ariana Mcdaniel MRN: [id number], DOB: 1976/02/03, 38 y.o. Date of Encounter: 08/24/2013  Primary Physician:  Viviana Simpler, MD   Chief Complaint: URI   Subjective:   History of Present Illness:  Ariana Mcdaniel is a 38 y.o. very pleasant female patient who presents with the following:  Came Monday. At that point, the patient was diagnosed with bronchitis and she was given some Zithromax. The patient continues to feel quite unwell, and she has a temperature max of 102, she is mildly short of breath and has a productive cough. She has a little bit of some pain in her chest, and has not had any other focal congestion, earache, sore throat, or other symptoms. She is not having myalgias or arthralgias.   Past Medical History, Surgical History, Social History, Family History, Problem List, Medications, and Allergies have been reviewed and updated if relevant.  Review of Systems:  GEN: fever to 102 GI: No n/v/d, eating normally Pulm: No SOB Interactive and getting along well at home.  Otherwise, ROS is as per the HPI.  Objective:   Physical Examination: BP 120/80  Pulse 106  Temp(Src) 98.6 F (37 C) (Tympanic)  Wt 227 lb (102.967 kg)  SpO2 96%  LMP 08/06/2013   GEN: WDWN, NAD, Non-toxic, A & O x 3 HEENT: Atraumatic, Normocephalic. Neck supple. No masses, No LAD. Ears and Nose: No external deformity. CV: RRR, No M/G/R. No JVD. No thrill. No extra heart sounds. PULM: RLL with focal crackles. No resp. distress. No accessory muscle use. EXTR: No c/c/e NEURO Normal gait.  PSYCH: Normally interactive. Conversant. Not depressed or anxious appearing.  Calm demeanor.   Laboratory and Imaging Data:  Assessment & Plan:   Right lower lobe pneumonia  Clinically consistent with right lower lobe pneumonia, known to alter her antimicrobial pattern to include more typical bugs additionally.  Continue with supportive care.  New Prescriptions   LEVOFLOXACIN (LEVAQUIN) 500 MG TABLET    Take 1 tablet (500 mg total) by mouth daily.   Modified Medications   No medications on file   No orders of the defined types were placed in this encounter.   Follow-up: No Follow-up on file. Unless noted above, the patient is to follow-up if symptoms worsen. Red flags were reviewed with the patient.  Signed,  Maud Deed. Dejanira Pamintuan, MD, CAQ Sports Medicine   Discontinued Medications   AZITHROMYCIN (ZITHROMAX) 250 MG TABLET    Take 2 tabs today, then 1 tab daily x 4 days   Current Medications at Discharge:   Medication List       This list is accurate as of: 08/24/13  1:54 PM.  Always use your most recent med list.               cetirizine 10 MG tablet  Commonly known as:  ZYRTEC  Take 10 mg by mouth at bedtime.     fluticasone 50 MCG/ACT nasal spray  Commonly known as:  FLONASE  Place 2 sprays into the nose daily. In each nostril     levofloxacin 500 MG tablet  Commonly known as:  LEVAQUIN  Take 1 tablet (500 mg total) by mouth daily.

## 2013-08-24 NOTE — Progress Notes (Signed)
Pre visit review using our clinic review tool, if applicable. No additional management support is needed unless otherwise documented below in the visit note. 

## 2013-09-05 ENCOUNTER — Ambulatory Visit (INDEPENDENT_AMBULATORY_CARE_PROVIDER_SITE_OTHER)
Admission: RE | Admit: 2013-09-05 | Discharge: 2013-09-05 | Disposition: A | Discharge status: Home / Self Care | Payer: BC Managed Care – PPO | Source: Ambulatory Visit | Attending: Internal Medicine | Admitting: Internal Medicine

## 2013-09-05 ENCOUNTER — Encounter: Payer: Self-pay | Admitting: Internal Medicine

## 2013-09-05 ENCOUNTER — Telehealth: Payer: Self-pay

## 2013-09-05 ENCOUNTER — Ambulatory Visit: Payer: BC Managed Care – PPO | Admitting: Internal Medicine

## 2013-09-05 ENCOUNTER — Telehealth: Payer: Self-pay | Admitting: Internal Medicine

## 2013-09-05 ENCOUNTER — Ambulatory Visit (INDEPENDENT_AMBULATORY_CARE_PROVIDER_SITE_OTHER): Payer: BC Managed Care – PPO | Admitting: Internal Medicine

## 2013-09-05 VITALS — BP 118/84 | HR 74 | Temp 98.4°F | Resp 14 | Wt 231.0 lb

## 2013-09-05 DIAGNOSIS — J40 Bronchitis, not specified as acute or chronic: Secondary | ICD-10-CM | POA: Insufficient documentation

## 2013-09-05 DIAGNOSIS — J189 Pneumonia, unspecified organism: Secondary | ICD-10-CM

## 2013-09-05 DIAGNOSIS — A493 Mycoplasma infection, unspecified site: Secondary | ICD-10-CM

## 2013-09-05 DIAGNOSIS — J209 Acute bronchitis, unspecified: Secondary | ICD-10-CM

## 2013-09-05 DIAGNOSIS — J200 Acute bronchitis due to Mycoplasma pneumoniae: Secondary | ICD-10-CM

## 2013-09-05 MED ORDER — LEVOFLOXACIN 500 MG PO TABS
500.0000 mg | ORAL_TABLET | Freq: Every day | ORAL | Status: DC
Start: 2013-09-05 — End: 2013-11-03

## 2013-09-05 NOTE — Progress Notes (Signed)
   Subjective:    Patient ID: Ariana Mcdaniel, female    DOB: 06/21/1975, 38 y.o.   MRN: [id number]  HPI Thinks she "stays sick" since she stopped smoking 3 years  First got sick a little over 2 weeks ago Chest felt tight No head symptoms Then cough and mucus started---fever, and got sick  No response to z-pak Came back then and saw Dr Lorelei Pont Improved with the levaquin  Felt she was better till this AM  Chest tight again-- dry cough Low grade fever this AM Sense of SOB also  No OTC meds  Current Outpatient Prescriptions on File Prior to Visit  Medication Sig Dispense Refill  . cetirizine (ZYRTEC) 10 MG tablet Take 10 mg by mouth at bedtime.      . fluticasone (FLONASE) 50 MCG/ACT nasal spray Place 2 sprays into the nose daily. In each nostril  16 g  12   No current facility-administered medications on file prior to visit.    Allergies  Allergen Reactions  . Amoxicillin     itching  . Clindamycin/Lincomycin     Caused a superinfection  . Oxycodone-Acetaminophen Hives    Past Medical History  Diagnosis Date  . Mastodynia   . Allergic rhinitis due to pollen     Past Surgical History  Procedure Laterality Date  . Tonsillectomy    . Wisdom tooth extraction      Family History  Problem Relation Age of Onset  . Hypertension Maternal Grandmother   . Diabetes Maternal Grandmother   . Heart disease Neg Hx   . Depression Paternal Grandmother     History   Social History  . Marital Status: Married    Spouse Name: N/A    Number of Children: 2  . Years of Education: N/A   Occupational History  . [place] History Main Topics  . Smoking status: Former Smoker    Quit date: 01/04/2010  . Smokeless tobacco: Never Used  . Alcohol Use: 1.2 oz/week    2 Cans of beer per week     Comment: rare  . Drug Use: No  . Sexual Activity: Yes    Partners: Male    Birth Control/ Protection: None   Other Topics Concern  . Not on file    Social History Narrative   2nd marriage   Review of Systems Diarrhea yesterday No vomiting and appetite is okay No rash     Objective:   Physical Exam  Constitutional: She appears well-developed and well-nourished. No distress.  HENT:  Mouth/Throat: Oropharynx is clear and moist. No oropharyngeal exudate.  No sinus tenderness Slight nasal congestion TMs normal  Neck: Normal range of motion. Neck supple. No thyromegaly present.  Pulmonary/Chest: Effort normal and breath sounds normal. No respiratory distress. She has no wheezes. She has no rales.  Lymphadenopathy:    She has no cervical adenopathy.          Assessment & Plan:

## 2013-09-05 NOTE — Telephone Encounter (Signed)
She needs to be rechecked, reevaluated. Unusual for PNA s/p zithromax and levaquin not to be treated. May need further eval.   Rec. F/u with PCP to reassess. Viviana Simpler, MD   Thanks. Electronically Signed  By: Owens Loffler, MD On: 09/05/2013 10:50 AM

## 2013-09-05 NOTE — Telephone Encounter (Signed)
Please call her about this

## 2013-09-05 NOTE — Telephone Encounter (Signed)
Pt requesting c/b

## 2013-09-05 NOTE — Telephone Encounter (Signed)
I have not seen her in years If she is not better, she needs recheck and CXR If she can come later today, add her on at 4:45 and have her come in early for CXR (persistent cough)--then my visit

## 2013-09-05 NOTE — Telephone Encounter (Signed)
Pt left v/m; pt was seen on 08/22/13 and 08/24/13 diagnosed with pneumonia. Pt finished antibiotics on 09/02/13 and pt felt fine until last night and pt developed chest tightness and low grade fever. Pt wants to know if can get refill of antibiotic to [facility] or does pt need to be rechecked.pt request cb.

## 2013-09-05 NOTE — Assessment & Plan Note (Signed)
Recurrence after response to levaquin suggests atypical organism like M pneumoniae CXR fine Discussed natural history Will treat with levaquin for another 10 days

## 2013-09-05 NOTE — Telephone Encounter (Signed)
I spoke with patient and she said she could come for the office visit at 4:45 and she'll arrive at 4:15 for the CXR.

## 2013-09-05 NOTE — Telephone Encounter (Signed)
Does patient need follow up? 

## 2013-09-05 NOTE — Progress Notes (Signed)
Pre visit review using our clinic review tool, if applicable. No additional management support is needed unless otherwise documented below in the visit note. 

## 2013-11-03 ENCOUNTER — Ambulatory Visit (INDEPENDENT_AMBULATORY_CARE_PROVIDER_SITE_OTHER): Payer: BC Managed Care – PPO | Admitting: Internal Medicine

## 2013-11-03 ENCOUNTER — Encounter: Payer: Self-pay | Admitting: *Deleted

## 2013-11-03 ENCOUNTER — Encounter: Payer: Self-pay | Admitting: Internal Medicine

## 2013-11-03 VITALS — BP 120/80 | HR 95 | Temp 97.6°F | Resp 16 | Wt 234.0 lb

## 2013-11-03 DIAGNOSIS — J018 Other acute sinusitis: Secondary | ICD-10-CM

## 2013-11-03 DIAGNOSIS — J019 Acute sinusitis, unspecified: Secondary | ICD-10-CM | POA: Insufficient documentation

## 2013-11-03 MED ORDER — TRAMADOL HCL 50 MG PO TABS: 50.0000 mg | ORAL_TABLET | Freq: Every evening | ORAL | Status: DC | PRN

## 2013-11-03 MED ORDER — AZITHROMYCIN 250 MG PO TABS: ORAL_TABLET | ORAL | Status: DC

## 2013-11-03 NOTE — Assessment & Plan Note (Signed)
Bad systemic symptoms at first---now mostly drainage and cough Probably still viral Will try tramadol for night oough z-pak for if she worsens

## 2013-11-03 NOTE — Progress Notes (Signed)
   Subjective:    Patient ID: Ariana Mcdaniel, female    DOB: March 16, 1975, 38 y.o.   MRN: [id number]  HPI Did finally get better--about 2 weeks after the last visit Back to normal with no cough  Now sick again for a week Chest tight Head pounding Body aches Some fever over the weekend  Feels better over the past 3-4 days but severe cough Especially bad at night--keeping her up  Cough is slightly productive-- swallows Now has post nasal drip---makes her gag No ear pain No night sweats or chills  Current Outpatient Prescriptions on File Prior to Visit  Medication Sig Dispense Refill  . cetirizine (ZYRTEC) 10 MG tablet Take 10 mg by mouth at bedtime.      . fluticasone (FLONASE) 50 MCG/ACT nasal spray Place 2 sprays into the nose daily. In each nostril  16 g  12   No current facility-administered medications on file prior to visit.    Allergies  Allergen Reactions  . Amoxicillin     itching  . Clindamycin/Lincomycin     Caused a superinfection  . Oxycodone-Acetaminophen Hives    Past Medical History  Diagnosis Date  . Mastodynia   . Allergic rhinitis due to pollen     Past Surgical History  Procedure Laterality Date  . Tonsillectomy    . Wisdom tooth extraction      Family History  Problem Relation Age of Onset  . Hypertension Maternal Grandmother   . Diabetes Maternal Grandmother   . Heart disease Neg Hx   . Depression Paternal Grandmother     History   Social History  . Marital Status: Married    Spouse Name: N/A    Number of Children: 2  . Years of Education: N/A   Occupational History  . [employer] History Main Topics  . Smoking status: Former Smoker    Quit date: 01/04/2010  . Smokeless tobacco: Never Used  . Alcohol Use: 1.2 oz/week    2 Cans of beer per week     Comment: rare  . Drug Use: No  . Sexual Activity: Yes    Partners: Male    Birth Control/ Protection: None   Other Topics Concern  . Not on file     Social History Narrative   2nd marriage   Review of Systems No rash No vomiting or diarrhea Appetite was off over weekend when sicker--better now    Objective:   Physical Exam  Constitutional: She appears well-developed and well-nourished. No distress.  occ cough  HENT:  Mouth/Throat: Oropharynx is clear and moist. No oropharyngeal exudate.  No sinus tenderness Marked right nasal inflammation TMs normal  Neck: Normal range of motion. Neck supple.  Pulmonary/Chest: Effort normal and breath sounds normal. No respiratory distress. She has no wheezes. She has no rales.  Lymphadenopathy:    She has no cervical adenopathy.  Skin: No rash noted.          Assessment & Plan:

## 2013-11-03 NOTE — Progress Notes (Signed)
Pre visit review using our clinic review tool, if applicable. No additional management support is needed unless otherwise documented below in the visit note. 

## 2013-11-03 NOTE — Patient Instructions (Signed)
Please take the z-pak if you are worsening over the next few days--instead of improving.

## 2013-12-15 NOTE — Telephone Encounter (Signed)
error 

## 2014-01-09 ENCOUNTER — Encounter: Payer: Self-pay | Admitting: Internal Medicine

## 2014-03-08 ENCOUNTER — Ambulatory Visit (INDEPENDENT_AMBULATORY_CARE_PROVIDER_SITE_OTHER): Payer: BC Managed Care – PPO | Admitting: Family Medicine

## 2014-03-08 ENCOUNTER — Encounter: Payer: Self-pay | Admitting: Family Medicine

## 2014-03-08 VITALS — BP 126/84 | HR 88 | Temp 98.3°F | Wt 238.5 lb

## 2014-03-08 DIAGNOSIS — H00013 Hordeolum externum right eye, unspecified eyelid: Secondary | ICD-10-CM | POA: Insufficient documentation

## 2014-03-08 MED ORDER — ERYTHROMYCIN 5 MG/GM OP OINT: 1.0000 "application " | TOPICAL_OINTMENT | Freq: Every day | OPHTHALMIC | Status: DC

## 2014-03-08 NOTE — Assessment & Plan Note (Signed)
Supportive care discussed - warm compresses tid. If persistent provided with romycin ointment prescription. Update if not improved for referral to optho (if develops into chalazion). Pt agrees with plan.

## 2014-03-08 NOTE — Progress Notes (Signed)
Pre visit review using our clinic review tool, if applicable. No additional management support is needed unless otherwise documented below in the visit note. 

## 2014-03-08 NOTE — Progress Notes (Signed)
   BP 126/84 mmHg  Pulse 88  Temp(Src) 98.3 F (36.8 C) (Oral)  Wt 238 lb 8 oz (108.183 kg)  LMP 02/24/2014   CC: R eye pain  Subjective:    Patient ID: Ariana Mcdaniel, female    DOB: [birth year], 38 y.o.   MRN: [id number]  HPI: Ariana Mcdaniel is a 38 y.o. female presenting on 03/08/2014 for Eye Problem   3d h/o R lower eyelid swelling nasally. Yesterday R eye tearing. Pain actually better but pressure has increased. No pruritis. No pain with eye movements.  No conjunctival erythema.  No fevers/chills. No recent viral sxs. Hasn't tried anything for eye yet.  H/o perennial allergic rhinitis. No sick contacts at home.  Relevant past medical, surgical, family and social history reviewed and updated as indicated. Interim medical history since our last visit reviewed. Allergies and medications reviewed and updated.  Current Outpatient Prescriptions on File Prior to Visit  Medication Sig  . fluticasone (FLONASE) 50 MCG/ACT nasal spray Place 2 sprays into the nose daily. In each nostril   No current facility-administered medications on file prior to visit.    Review of Systems Per HPI unless specifically indicated above     Objective:    BP 126/84 mmHg  Pulse 88  Temp(Src) 98.3 F (36.8 C) (Oral)  Wt 238 lb 8 oz (108.183 kg)  LMP 02/24/2014  Wt Readings from Last 3 Encounters:  03/08/14 238 lb 8 oz (108.183 kg)  11/03/13 234 lb (106.142 kg)  09/05/13 231 lb (104.781 kg)    Physical Exam  Constitutional: She appears well-developed and well-nourished.  HENT:  Head: Normocephalic and atraumatic.  Eyes: Conjunctivae and EOM are normal. Pupils are equal, round, and reactive to light. Right eye exhibits hordeolum. Right eye exhibits no discharge. Left eye exhibits no discharge and no hordeolum. Right conjunctiva is not injected. Left conjunctiva is not injected. Right eye exhibits normal extraocular motion. Left eye exhibits normal extraocular motion.    R eye with  external hordeolum with border of erythema, tear duct patent. EOMI without pain.  Nursing note and vitals reviewed.      Assessment & Plan:   Problem List Items Addressed This Visit    Hordeolum of right eye - Primary    Supportive care discussed - warm compresses tid. If persistent provided with romycin ointment prescription. Update if not improved for referral to optho (if develops into chalazion). Pt agrees with plan.        Follow up plan: Return if symptoms worsen or fail to improve.

## 2014-03-08 NOTE — Patient Instructions (Signed)
Treat with regular warm compresses three times a day as well as antibiotic eye ointment. Update [place] if not improving as expected.  Sty A sty (hordeolum) is an infection of a gland in the eyelid located at the base of the eyelash. A sty may develop a white or yellow head of pus. It can be puffy (swollen). Usually, the sty will burst and pus will come out on its own. They do not leave lumps in the eyelid once they drain. A sty is often confused with another form of cyst of the eyelid called a chalazion. Chalazions occur within the eyelid and not on the edge where the bases of the eyelashes are. They often are red, sore and then form firm lumps in the eyelid. CAUSES   Germs (bacteria).  Lasting (chronic) eyelid inflammation. SYMPTOMS   Tenderness, redness and swelling along the edge of the eyelid at the base of the eyelashes.  Sometimes, there is a white or yellow head of pus. It may or may not drain. DIAGNOSIS  An ophthalmologist will be able to distinguish between a sty and a chalazion and treat the condition appropriately.  TREATMENT   Styes are typically treated with warm packs (compresses) until drainage occurs.  In rare cases, medicines that kill germs (antibiotics) may be prescribed. These antibiotics may be in the form of drops, cream or pills.  If a hard lump has formed, it is generally necessary to do a small incision and remove the hardened contents of the cyst in a minor surgical procedure done in the office.  In suspicious cases, your caregiver may send the contents of the cyst to the lab to be certain that it is not a rare, but dangerous form of cancer of the glands of the eyelid. HOME CARE INSTRUCTIONS   Wash your hands often and dry them with a clean towel. Avoid touching your eyelid. This may spread the infection to other parts of the eye.  Apply heat to your eyelid for 10 to 20 minutes, several times a day, to ease pain and help to heal it faster.  Do not squeeze the  sty. Allow it to drain on its own. Wash your eyelid carefully 3 to 4 times per day to remove any pus. SEEK IMMEDIATE MEDICAL CARE IF:   Your eye becomes painful or puffy (swollen).  Your vision changes.  Your sty does not drain by itself within 3 days.  Your sty comes back within a short period of time, even with treatment.  You have redness (inflammation) around the eye.  You have a fever. Document Released: 12/04/2004 Document Revised: 05/19/2011 Document Reviewed: 06/10/2013 [facility] Patient Information 2015 [place], [place]. This information is not intended to replace advice given to you by your health care provider. Make sure you discuss any questions you have with your health care provider.

## 2014-08-01 ENCOUNTER — Ambulatory Visit (INDEPENDENT_AMBULATORY_CARE_PROVIDER_SITE_OTHER): Payer: BC Managed Care – PPO | Admitting: Physician Assistant

## 2014-08-01 VITALS — BP 124/82 | HR 92 | Temp 99.0°F | Resp 16 | Ht 65.0 in | Wt 235.4 lb

## 2014-08-01 DIAGNOSIS — J329 Chronic sinusitis, unspecified: Secondary | ICD-10-CM | POA: Diagnosis not present

## 2014-08-01 MED ORDER — AMOXICILLIN-POT CLAVULANATE 875-125 MG PO TABS: 1.0000 | ORAL_TABLET | Freq: Two times a day (BID) | ORAL | Status: AC

## 2014-08-01 NOTE — Progress Notes (Signed)
[facility] [address], [place] [address] [phone]  Date:  08/01/2014   Name:  Ariana Mcdaniel   DOB:  [birth year]   MRN:  [id number]  PCP:  Ariana Simpler, MD    History of Present Illness:  Ariana Mcdaniel is a 39 y.o. female patient who presents to [facility] with chief complaint of sinus pressure, progressively worsening with jaw and tooth pain.  Patient reports that she has nasal congestion, and has noted pain at her jaws and face.  There is no rhinorrhea.  She has some water eyes.  She is unsure of mucus thickness.  She never blows her nose.  She has no fever, but she does have some general fatigue.  There is no photophobia, or phonophobia.  Tooth pain is at her right side.  There is no swelling.  She noticed the pain this morning when drinking cold water.    Patient Active Problem List   Diagnosis Date Noted  . Hordeolum of right eye 03/08/2014  . Allergic rhinitis due to pollen     Past Medical History  Diagnosis Date  . Mastodynia   . Allergic rhinitis due to pollen     Past Surgical History  Procedure Laterality Date  . Tonsillectomy    . Wisdom tooth extraction      History  Substance Use Topics  . Smoking status: Former Smoker    Quit date: 01/04/2010  . Smokeless tobacco: Never Used  . Alcohol Use: 1.2 oz/week    2 Cans of beer per week     Comment: rare    Family History  Problem Relation Age of Onset  . Hypertension Maternal Grandmother   . Diabetes Maternal Grandmother   . Heart disease Neg Hx   . Depression Paternal Grandmother     Allergies  Allergen Reactions  . Amoxicillin     itching  . Clindamycin/Lincomycin     Caused a superinfection  . Oxycodone-Acetaminophen Hives    Medication list has been reviewed and updated.  Current Outpatient Prescriptions on File Prior to Visit  Medication Sig Dispense Refill  . fluticasone (FLONASE) 50 MCG/ACT nasal spray Place 2 sprays into the nose daily. In each nostril 16 g 12  .  erythromycin [facility]) ophthalmic ointment Place 1 application into the right eye at bedtime. (Patient not taking: Reported on 08/01/2014) 3.5 g 0   No current facility-administered medications on file prior to visit.    ROS ROS otherwise unremarkable unless listed above.    Physical Examination: BP 124/82 mmHg  Pulse 92  Temp(Src) 99 F (37.2 C) (Oral)  Resp 16  Ht 5\' 5"  (1.651 m)  Wt 235 lb 6.4 oz (106.777 kg)  BMI 39.17 kg/m2  SpO2 98%  LMP 07/30/2014 Ideal Body Weight: Weight in (lb) to have BMI = 25: 149.9  Physical Exam  Constitutional: She is oriented to person, place, and time. She appears well-developed and well-nourished.  HENT:  Right Ear: External ear and ear canal normal. A middle ear effusion is present.  Left Ear: External ear and ear canal normal. A middle ear effusion is present.  Nose: Mucosal edema and rhinorrhea present. Right sinus exhibits maxillary sinus tenderness and frontal sinus tenderness. Left sinus exhibits maxillary sinus tenderness and frontal sinus tenderness.  Mouth/Throat: No uvula swelling. No oropharyngeal exudate, posterior oropharyngeal edema or posterior oropharyngeal erythema.  Blackened fillings at her back molars at right and left side of upper teeth.  Fillings unknown.  No pain  with teeth percussion or palpation.  No gingival swelling detected.    Eyes: Pupils are equal, round, and reactive to light.  Cardiovascular: Normal rate, regular rhythm and normal heart sounds.   Pulmonary/Chest: Effort normal and breath sounds normal. No respiratory distress. She has no wheezes.  Neurological: She is alert and oriented to person, place, and time.  Skin: Skin is warm and dry.  Psychiatric: She has a normal mood and affect. Her behavior is normal.     Assessment and Plan: 39 year old female is here for 2 weeks of sinus issues and new onset of tooth pain.  Symptoms consistent with sinus infection.  Treating with abx.  Poor dentition, however  tooth infection not visible.  Likely cavities present.  Will treat, with dental visit approaching in 2 weeks.    1. Sinusitis, unspecified chronicity, unspecified location - amoxicillin-clavulanate (AUGMENTIN) 875-125 MG per tablet; Take 1 tablet by mouth 2 (two) times daily.  Dispense: 20 tablet; Refill: 0  Ivar Drape, PA-C [facility] 5/27/20169:58 AM

## 2014-08-01 NOTE — Patient Instructions (Signed)
Continue to drink plenty of water. Take the medication with a meal. It may be advantageous to start Claritin or allegra, and rotate ever 3-6 months.   Sinusitis Sinusitis is redness, soreness, and inflammation of the paranasal sinuses. Paranasal sinuses are air pockets within the bones of your face (beneath the eyes, the middle of the forehead, or above the eyes). In healthy paranasal sinuses, mucus is able to drain out, and air is able to circulate through them by way of your nose. However, when your paranasal sinuses are inflamed, mucus and air can become trapped. This can allow bacteria and other germs to grow and cause infection. Sinusitis can develop quickly and last only a short time (acute) or continue over a long period (chronic). Sinusitis that lasts for more than 12 weeks is considered chronic.  CAUSES  Causes of sinusitis include:  Allergies.  Structural abnormalities, such as displacement of the cartilage that separates your nostrils (deviated septum), which can decrease the air flow through your nose and sinuses and affect sinus drainage.  Functional abnormalities, such as when the small hairs (cilia) that line your sinuses and help remove mucus do not work properly or are not present. SIGNS AND SYMPTOMS  Symptoms of acute and chronic sinusitis are the same. The primary symptoms are pain and pressure around the affected sinuses. Other symptoms include:  Upper toothache.  Earache.  Headache.  Bad breath.  Decreased sense of smell and taste.  A cough, which worsens when you are lying flat.  Fatigue.  Fever.  Thick drainage from your nose, which often is green and may contain pus (purulent).  Swelling and warmth over the affected sinuses. DIAGNOSIS  Your health care provider will perform a physical exam. During the exam, your health care provider may:  Look in your nose for signs of abnormal growths in your nostrils (nasal polyps).  Tap over the affected sinus to  check for signs of infection.  View the inside of your sinuses (endoscopy) using an imaging device that has a light attached (endoscope). If your health care provider suspects that you have chronic sinusitis, one or more of the following tests may be recommended:  Allergy tests.  Nasal culture. A sample of mucus is taken from your nose, sent to a lab, and screened for bacteria.  Nasal cytology. A sample of mucus is taken from your nose and examined by your health care provider to determine if your sinusitis is related to an allergy. TREATMENT  Most cases of acute sinusitis are related to a viral infection and will resolve on their own within 10 days. Sometimes medicines are prescribed to help relieve symptoms (pain medicine, decongestants, nasal steroid sprays, or saline sprays).  However, for sinusitis related to a bacterial infection, your health care provider will prescribe antibiotic medicines. These are medicines that will help kill the bacteria causing the infection.  Rarely, sinusitis is caused by a fungal infection. In theses cases, your health care provider will prescribe antifungal medicine. For some cases of chronic sinusitis, surgery is needed. Generally, these are cases in which sinusitis recurs more than 3 times per year, despite other treatments. HOME CARE INSTRUCTIONS   Drink plenty of water. Water helps thin the mucus so your sinuses can drain more easily.  Use a humidifier.  Inhale steam 3 to 4 times a day (for example, sit in the bathroom with the shower running).  Apply a warm, moist washcloth to your face 3 to 4 times a day, or as directed by  your health care provider.  Use saline nasal sprays to help moisten and clean your sinuses.  Take medicines only as directed by your health care provider.  If you were prescribed either an antibiotic or antifungal medicine, finish it all even if you start to feel better. SEEK IMMEDIATE MEDICAL CARE IF:  You have increasing  pain or severe headaches.  You have nausea, vomiting, or drowsiness.  You have swelling around your face.  You have vision problems.  You have a stiff neck.  You have difficulty breathing. MAKE SURE YOU:   Understand these instructions.  Will watch your condition.  Will get help right away if you are not doing well or get worse. Document Released: 02/24/2005 Document Revised: 07/11/2013 Document Reviewed: 03/11/2011 [facility] Patient Information 2015 [place], [place]. This information is not intended to replace advice given to you by your health care provider. Make sure you discuss any questions you have with your health care provider.

## 2014-08-09 ENCOUNTER — Telehealth: Payer: Self-pay

## 2014-08-09 DIAGNOSIS — J019 Acute sinusitis, unspecified: Secondary | ICD-10-CM

## 2014-08-09 NOTE — Telephone Encounter (Signed)
Pt was seen here on 5/24 by [place]. She was prescribed amoxicillin-clavulanate (AUGMENTIN) 875-125 MG per tablet [[id number]]. She stated this medication has given her a reaction of being itchy. She stopped taking this med 5/27. She would like another antibiotic. Please advise at [phone]

## 2014-08-11 MED ORDER — DOXYCYCLINE HYCLATE 100 MG PO CAPS: 100.0000 mg | ORAL_CAPSULE | Freq: Two times a day (BID) | ORAL | Status: AC

## 2014-08-11 NOTE — Telephone Encounter (Signed)
Pt would like a different abx.  Cell: [phone]

## 2014-08-11 NOTE — Telephone Encounter (Signed)
Patient is calling back because she hasn't heard anything and needs advise. Please call! She can be reached at work anytime before 5. Her work # is [phone]

## 2014-08-11 NOTE — Telephone Encounter (Signed)
Patient states that she continues to have nasal pressure and sinus pain.  She states that it was improving, but when she discontinued the medication on Day 4, the symptoms returned days after.  She discontinued due to rash and hives, but did not contact sooner due to being out of town.  She has had no fever, sob, or tooth pain during this time.  She now recalls, that she is in fact, allergic to the amoxicillin.

## 2014-12-06 ENCOUNTER — Encounter (HOSPITAL_BASED_OUTPATIENT_CLINIC_OR_DEPARTMENT_OTHER): Payer: Self-pay

## 2014-12-06 ENCOUNTER — Emergency Department (HOSPITAL_BASED_OUTPATIENT_CLINIC_OR_DEPARTMENT_OTHER)
Admission: EM | Admit: 2014-12-06 | Discharge: 2014-12-06 | Disposition: A | Discharge status: Home / Self Care | Payer: BC Managed Care – PPO | Attending: Emergency Medicine | Admitting: Emergency Medicine

## 2014-12-06 ENCOUNTER — Emergency Department (HOSPITAL_BASED_OUTPATIENT_CLINIC_OR_DEPARTMENT_OTHER): Payer: BC Managed Care – PPO

## 2014-12-06 DIAGNOSIS — Z880 Allergy status to penicillin: Secondary | ICD-10-CM | POA: Insufficient documentation

## 2014-12-06 DIAGNOSIS — N200 Calculus of kidney: Secondary | ICD-10-CM | POA: Diagnosis not present

## 2014-12-06 DIAGNOSIS — Z8709 Personal history of other diseases of the respiratory system: Secondary | ICD-10-CM | POA: Insufficient documentation

## 2014-12-06 DIAGNOSIS — R109 Unspecified abdominal pain: Secondary | ICD-10-CM

## 2014-12-06 DIAGNOSIS — Z87891 Personal history of nicotine dependence: Secondary | ICD-10-CM | POA: Insufficient documentation

## 2014-12-06 LAB — BASIC METABOLIC PANEL
Anion gap: 8 (ref 5–15)
BUN: 14 mg/dL (ref 6–20)
CHLORIDE: 104 mmol/L (ref 101–111)
CO2: 26 mmol/L (ref 22–32)
CREATININE: 0.82 mg/dL (ref 0.44–1.00)
Calcium: 8.9 mg/dL (ref 8.9–10.3)
GFR calc Af Amer: >60 mL/min (ref >60)
GFR calc non Af Amer: >60 mL/min (ref >60)
Glucose, Bld: 127 mg/dL — ABNORMAL HIGH (ref 65–99)
POTASSIUM: 3.6 mmol/L (ref 3.5–5.1)
SODIUM: 138 mmol/L (ref 135–145)

## 2014-12-06 LAB — URINALYSIS, ROUTINE W REFLEX MICROSCOPIC
Bilirubin Urine: NEGATIVE
GLUCOSE, UA: NEGATIVE mg/dL
Ketones, ur: NEGATIVE mg/dL
Leukocytes, UA: NEGATIVE
Nitrite: NEGATIVE
Protein, ur: NEGATIVE mg/dL
SPECIFIC GRAVITY, URINE: 1.025 (ref 1.005–1.030)
Urobilinogen, UA: 0.2 mg/dL (ref 0.0–1.0)
pH: 5.5 (ref 5.0–8.0)

## 2014-12-06 LAB — URINE MICROSCOPIC-ADD ON

## 2014-12-06 LAB — PREGNANCY, URINE: PREG TEST UR: NEGATIVE

## 2014-12-06 MED ORDER — HYDROMORPHONE HCL 1 MG/ML IJ SOLN
1.0000 mg | Freq: Once | INTRAMUSCULAR | Status: DC
Start: 2014-12-06 — End: 2014-12-06
  Filled 2014-12-06: qty 1

## 2014-12-06 MED ORDER — KETOROLAC TROMETHAMINE 30 MG/ML IJ SOLN
30.0000 mg | Freq: Once | INTRAMUSCULAR | Status: AC
  Administered 2014-12-06: 30 mg via INTRAVENOUS
  Filled 2014-12-06: qty 1

## 2014-12-06 MED ORDER — ONDANSETRON HCL 4 MG/2ML IJ SOLN
4.0000 mg | Freq: Once | INTRAMUSCULAR | Status: AC
  Administered 2014-12-06: 4 mg via INTRAVENOUS
  Filled 2014-12-06: qty 2

## 2014-12-06 MED ORDER — ONDANSETRON HCL 4 MG PO TABS: 4.0000 mg | ORAL_TABLET | Freq: Four times a day (QID) | ORAL | Status: DC

## 2014-12-06 MED ORDER — HYDROCODONE-ACETAMINOPHEN 5-325 MG PO TABS: 1.0000 | ORAL_TABLET | ORAL | Status: DC | PRN

## 2014-12-06 NOTE — ED Notes (Signed)
Right flank/abd pain x 1 hour

## 2014-12-06 NOTE — ED Provider Notes (Addendum)
CSN: [id number]     Arrival date & time 12/06/14  1212 History   First MD Initiated Contact with Patient 12/06/14 1236     Chief Complaint  Patient presents with  . Flank Pain     (Consider location/radiation/quality/duration/timing/severity/associated sxs/prior Treatment) HPI Comments: Patient stated approximately 1 hour ago she developed a sudden 10 out of 10 sharp searing radiating pain from the left flank to the lower abdomen. Nothing made it better and nothing made it worse. She states it is worse than labor. Prior to this she was having a normal day she denies any pain, nausea or vomiting. No fever or dysuria. Patient had one episode of vomiting during this episode. Upon arrival to the room and lying down she states the pain has now significantly relieved. She is currently on her menses and has some mild abdominal cramps but the rest of her pain is gone  Patient is a 39 y.o. female presenting with flank pain. The history is provided by the patient.  Flank Pain This is a new problem. The current episode started 1 to 2 hours ago. The problem occurs constantly. The problem has not changed since onset.Associated symptoms include abdominal pain. Nothing aggravates the symptoms. Nothing relieves the symptoms. She has tried nothing for the symptoms. The treatment provided significant relief.    Past Medical History  Diagnosis Date  . Mastodynia   . Allergic rhinitis due to pollen    Past Surgical History  Procedure Laterality Date  . Tonsillectomy    . Wisdom tooth extraction     Family History  Problem Relation Age of Onset  . Hypertension Maternal Grandmother   . Diabetes Maternal Grandmother   . Heart disease Neg Hx   . Depression Paternal Grandmother    Social History  Substance Use Topics  . Smoking status: Former Smoker    Quit date: 01/04/2010  . Smokeless tobacco: Never Used  . Alcohol Use: 1.2 oz/week    2 Cans of beer per week     Comment: rare   OB History     Gravida Para Term Preterm AB TAB SAB Ectopic Multiple Living   2 2        2      Review of Systems  Gastrointestinal: Positive for abdominal pain.  Genitourinary: Positive for flank pain.  All other systems reviewed and are negative.     Allergies  Amoxicillin; Clindamycin/lincomycin; and Oxycodone-acetaminophen  Home Medications   Prior to Admission medications   Not on File   BP 143/90 mmHg  Pulse 80  Temp(Src) 97.7 F (36.5 C) (Oral)  Resp 20  Ht 5\' 2"  (1.575 m)  Wt 240 lb (108.863 kg)  BMI 43.89 kg/m2  SpO2 100%  LMP 12/05/2014 Physical Exam  Constitutional: She is oriented to person, place, and time. She appears well-developed and well-nourished. No distress.  HENT:  Head: Normocephalic and atraumatic.  Mouth/Throat: Oropharynx is clear and moist.  Eyes: Conjunctivae and EOM are normal. Pupils are equal, round, and reactive to light.  Neck: Normal range of motion. Neck supple.  Cardiovascular: Normal rate, regular rhythm and intact distal pulses.   No murmur heard. Pulmonary/Chest: Effort normal and breath sounds normal. No respiratory distress. She has no wheezes. She has no rales.  Abdominal: Soft. She exhibits no distension. There is no tenderness. There is no rebound and no guarding.  Musculoskeletal: Normal range of motion. She exhibits no edema or tenderness.  Neurological: She is alert and oriented to person, place, and  time.  Skin: Skin is warm and dry. No rash noted. No erythema.  Psychiatric: She has a normal mood and affect. Her behavior is normal.  Nursing note and vitals reviewed.   ED Course  Procedures (including critical care time) Labs Review Labs Reviewed  URINALYSIS, ROUTINE W REFLEX MICROSCOPIC (NOT AT [facility]) - Abnormal; Notable for the following:    APPearance CLOUDY (*)    Hgb urine dipstick MODERATE (*)    All other components within normal limits  BASIC METABOLIC PANEL - Abnormal; Notable for the following:    Glucose, Bld 127 (*)     All other components within normal limits  URINE MICROSCOPIC-ADD ON - Abnormal; Notable for the following:    Crystals CA OXALATE CRYSTALS (*)    All other components within normal limits  PREGNANCY, URINE    Imaging Review Ct Renal Stone Study  12/06/2014   CLINICAL DATA:  RIGHT flank pain since 11:30 a.m.  Nausea.  EXAM: CT ABDOMEN AND PELVIS WITHOUT CONTRAST  TECHNIQUE: Multidetector CT imaging of the abdomen and pelvis was performed following the standard protocol without IV contrast.  COMPARISON:  None.  FINDINGS: Lower chest: Lung bases are clear.  Hepatobiliary: No focal hepatic lesion. No biliary duct dilatation. Gallbladder is normal. Common bile duct is normal.  Pancreas: Pancreas is normal. No ductal dilatation. No pancreatic inflammation.  Spleen: Normal spleen  Adrenals/urinary tract: Adrenal glands are normal. There is a 1 mm calculus within the mid RIGHT kidney on image 47, series 2. No ureterolithiasis or obstructive uropathy. No bladder calculi. Multiple calcifications along the body uterus are felt to be vascular.  Stomach/Bowel: Stomach, small bowel, appendix, and cecum are normal. The colon and rectosigmoid colon are normal.  Vascular/Lymphatic: Abdominal aorta is normal caliber. There is no retroperitoneal or periportal lymphadenopathy. No pelvic lymphadenopathy.  Reproductive: Uterus and ovaries are normal.  Tampon in the vagina.  Musculoskeletal: No aggressive osseous lesion.  Other: No free fluid.  IMPRESSION: 1. Punctate RIGHT nephrolithiasis. 2. No ureterolithiasis or obstructive uropathy. 3. Normal appendix.   Electronically Signed   By: Suzy Bouchard M.D.   On: 12/06/2014 14:47   I have personally reviewed and evaluated these images and lab results as part of my medical decision-making.   EKG Interpretation None      MDM   Final diagnoses:  Kidney stone    Pt with symptoms consistent with kidney stone.  Denies infectious sx, or GI symptoms.  Low concern for  diverticulitis and no risk factors or history suggestive of AAA.  No hx suggestive of GU source (discharge) and otherwise pt is healthy.  Low suspicion for ovarian torsion or ruptured cyst.  Will hydrate, treat pain and ensure no infection with UA, UPT, BMP.  Pain is now significantly improved spontaneously and feel that it may have passed.   3:12 PM Pain did come back briefly but is now resolved. CT shows no finding of acute stone at this time. He'll most likely past. Patient discharged home with follow-up with [facility] if symptoms persist  Blanchie Dessert, MD 12/06/14 [place], MD 12/06/14 1515

## 2015-03-28 ENCOUNTER — Ambulatory Visit (INDEPENDENT_AMBULATORY_CARE_PROVIDER_SITE_OTHER): Payer: BC Managed Care – PPO | Admitting: Family Medicine

## 2015-03-28 VITALS — BP 128/84 | HR 75 | Temp 98.8°F | Resp 14 | Ht 63.0 in | Wt 234.0 lb

## 2015-03-28 DIAGNOSIS — Z23 Encounter for immunization: Secondary | ICD-10-CM | POA: Diagnosis not present

## 2015-03-28 DIAGNOSIS — R112 Nausea with vomiting, unspecified: Secondary | ICD-10-CM

## 2015-03-28 DIAGNOSIS — R109 Unspecified abdominal pain: Secondary | ICD-10-CM | POA: Diagnosis not present

## 2015-03-28 DIAGNOSIS — Z87442 Personal history of urinary calculi: Secondary | ICD-10-CM | POA: Diagnosis not present

## 2015-03-28 DIAGNOSIS — R319 Hematuria, unspecified: Secondary | ICD-10-CM | POA: Diagnosis not present

## 2015-03-28 LAB — POCT URINALYSIS DIP (MANUAL ENTRY)
Bilirubin, UA: NEGATIVE
GLUCOSE UA: NEGATIVE
Ketones, POC UA: NEGATIVE
LEUKOCYTES UA: NEGATIVE
Nitrite, UA: NEGATIVE
Protein Ur, POC: NEGATIVE
UROBILINOGEN UA: 0.2
pH, UA: 5.5

## 2015-03-28 LAB — POCT CBC
GRANULOCYTE PERCENT: 64.3 % (ref 37–80)
HEMATOCRIT: 41.7 % (ref 37.7–47.9)
Hemoglobin: 14.3 g/dL (ref 12.2–16.2)
Lymph, poc: 2.5 (ref 0.6–3.4)
MCH, POC: 28 pg (ref 27–31.2)
MCHC: 34.2 g/dL (ref 31.8–35.4)
MCV: 82 fL (ref 80–97)
MID (CBC): 0.2 (ref 0–0.9)
MPV: 7.9 fL (ref 0–99.8)
PLATELET COUNT, POC: 322 10*3/uL (ref 142–424)
POC GRANULOCYTE: 5 (ref 2–6.9)
POC LYMPH %: 32.8 % (ref 10–50)
POC MID %: 2.9 %M (ref 0–12)
RBC: 5.09 M/uL (ref 4.04–5.48)
RDW, POC: 12.7 %
WBC: 7.7 10*3/uL (ref 4.6–10.2)

## 2015-03-28 LAB — POC MICROSCOPIC URINALYSIS (UMFC)

## 2015-03-28 LAB — POCT URINE PREGNANCY: Preg Test, Ur: NEGATIVE

## 2015-03-28 NOTE — Patient Instructions (Signed)
Dietary Guidelines to Help Prevent Kidney Stones Your risk of kidney stones can be decreased by adjusting the foods you eat. The most important thing you can do is drink enough fluid. You should drink enough fluid to keep your urine clear or pale yellow. The following guidelines provide specific information for the type of kidney stone you have had. GUIDELINES ACCORDING TO TYPE OF KIDNEY STONE Calcium Oxalate Kidney Stones  Reduce the amount of salt you eat. Foods that have a lot of salt cause your body to release excess calcium into your urine. The excess calcium can combine with a substance called oxalate to form kidney stones.  Reduce the amount of animal protein you eat if the amount you eat is excessive. Animal protein causes your body to release excess calcium into your urine. Ask your dietitian how much protein from animal sources you should be eating.  Avoid foods that are high in oxalates. If you take vitamins, they should have less than 500 mg of vitamin C. Your body turns vitamin C into oxalates. You do not need to avoid fruits and vegetables high in vitamin C. Calcium Phosphate Kidney Stones  Reduce the amount of salt you eat to help prevent the release of excess calcium into your urine.  Reduce the amount of animal protein you eat if the amount you eat is excessive. Animal protein causes your body to release excess calcium into your urine. Ask your dietitian how much protein from animal sources you should be eating.  Get enough calcium from food or take a calcium supplement (ask your dietitian for recommendations). Food sources of calcium that do not increase your risk of kidney stones include:  Broccoli.  Dairy products, such as cheese and yogurt.  Pudding. Uric Acid Kidney Stones  Do not have more than 6 oz of animal protein per day. FOOD SOURCES Animal Protein Sources  Meat (all types).  Poultry.  Eggs.  Fish, seafood. Foods High in Salt  Salt seasonings.  Soy  sauce.  Teriyaki sauce.  Cured and processed meats.  Salted crackers and snack foods.  Fast food.  Canned soups and most canned foods. Foods High in Oxalates  Grains:  Amaranth.  Barley.  Grits.  Wheat germ.  Bran.  Buckwheat flour.  All bran cereals.  Pretzels.  Whole wheat bread.  Vegetables:  Beans (wax).  Beets and beet greens.  Collard greens.  Eggplant.  [place]ole.  Leeks.  Okra.  Parsley.  Rutabagas.  Spinach.  Swiss chard.  Tomato paste.  Fried potatoes.  Sweet potatoes.  Fruits:  Red currants.  Figs.  Kiwi.  Rhubarb.  Meat and Other Protein Sources:  Beans (dried).  Soy burgers and other soybean products.  Miso.  Nuts (peanuts, almonds, pecans, cashews, hazelnuts).  Nut butters.  Sesame seeds and tahini (paste made of sesame seeds).  Poppy seeds.  Beverages:  Chocolate drink mixes.  Soy milk.  Instant iced tea.  Juices made from high-oxalate fruits or vegetables.  Other:  Carob.  Chocolate.  Fruitcake.  Marmalades.   This information is not intended to replace advice given to you by your health care provider. Make sure you discuss any questions you have with your health care provider.   Document Released: 06/21/2010 Document Revised: 03/01/2013 Document Reviewed: 01/21/2013 [employer] Interactive Patient Education 2016 [employer]  

## 2015-03-28 NOTE — Progress Notes (Signed)
Subjective:    Patient ID: Ariana Mcdaniel, female    DOB: Mar 04, 1976, 40 y.o.   MRN: [id number]  03/28/2015  Nephrolithiasis   HPI This 40 y.o. female presents for evaluation of kidney stone.  Has been straining urine regularly. Has missed work for two days.  First kidney stone in 10//2016; had to leave work due to severe pain.  When presented to ED in 12/2014, performed CT scan; no active stone but one present in kidney.  Pain very similar and same side with vomiting and severe pain.  Pain improved yesterday.  This morning, pain moved from L flank to L groin region. Did not drink anything yesterday due to pain; last night husband hydrated aggressively.  Around 12:30pm that passed stone.  Still hurting in L flank.  No fever/chills/sweats.  No dysuria, hematuria, frequency, urgency, nocturia.  Vomited x 1 yesterday only; no diarrhea.  No constipation. No bloody stools or black stools.  Feeling much better now.  High tolerance for pain.  Taking Ibuprofen 600mg  every 4-6 hours.  No previous urology evaluation.  LMP this week.  drinks a lot of sweet tea; rare soda.  Husband with vasectomy.  PCP: Silvio Pate.   No flu vaccine this year.  Review of Systems  Constitutional: Positive for diaphoresis. Negative for fever, chills and fatigue.  Eyes: Negative for visual disturbance.  Respiratory: Negative for cough and shortness of breath.   Cardiovascular: Negative for chest pain, palpitations and leg swelling.  Gastrointestinal: Positive for abdominal pain. Negative for nausea, vomiting, diarrhea, constipation, blood in stool, abdominal distention, anal bleeding and rectal pain.  Endocrine: Negative for cold intolerance, heat intolerance, polydipsia, polyphagia and polyuria.  Genitourinary: Positive for flank pain and pelvic pain. Negative for dysuria, urgency, frequency, hematuria, decreased urine volume, vaginal bleeding, vaginal discharge, difficulty urinating, genital sores, vaginal pain and menstrual  problem.  Neurological: Negative for dizziness, tremors, seizures, syncope, facial asymmetry, speech difficulty, weakness, light-headedness, numbness and headaches.    Past Medical History  Diagnosis Date  . Mastodynia   . Allergic rhinitis due to pollen    Past Surgical History  Procedure Laterality Date  . Tonsillectomy    . Wisdom tooth extraction     Allergies  Allergen Reactions  . Amoxicillin     itching  . Clindamycin/Lincomycin     Caused a superinfection  . Oxycodone-Acetaminophen Hives   Current Outpatient Prescriptions  Medication Sig Dispense Refill  . HYDROcodone-acetaminophen (NORCO/VICODIN) 5-325 MG tablet Take 1-2 tablets by mouth every 4 (four) hours as needed. (Patient not taking: Reported on 03/28/2015) 10 tablet 0  . ondansetron (ZOFRAN) 4 MG tablet Take 1 tablet (4 mg total) by mouth every 6 (six) hours. (Patient not taking: Reported on 03/28/2015) 12 tablet 0   No current facility-administered medications for this visit.   Social History   Social History  . Marital Status: Married    Spouse Name: N/A  . Number of Children: 2  . Years of Education: N/A   Occupational History  . [employer] History Main Topics  . Smoking status: Former Smoker    Quit date: 01/04/2010  . Smokeless tobacco: Never Used  . Alcohol Use: 1.2 oz/week    2 Cans of beer per week     Comment: rare  . Drug Use: No  . Sexual Activity: Not on file     Comment: Vasectomy   Other Topics Concern  . Not on file   Social History Narrative  2nd marriage   Family History  Problem Relation Age of Onset  . Hypertension Maternal Grandmother   . Diabetes Maternal Grandmother   . Heart disease Neg Hx   . Depression Paternal Grandmother        Objective:    BP 128/84 mmHg  Pulse 75  Temp(Src) 98.8 F (37.1 C) (Oral)  Resp 14  Ht 5\' 3"  (1.6 m)  Wt 234 lb (106.142 kg)  BMI 41.46 kg/m2  SpO2 97% Physical Exam  Constitutional: She is  oriented to person, place, and time. She appears well-developed and well-nourished. No distress.  HENT:  Head: Normocephalic and atraumatic.  Right Ear: External ear normal.  Left Ear: External ear normal.  Nose: Nose normal.  Mouth/Throat: Oropharynx is clear and moist.  Eyes: Conjunctivae and EOM are normal. Pupils are equal, round, and reactive to light.  Neck: Normal range of motion. Neck supple. Carotid bruit is not present. No thyromegaly present.  Cardiovascular: Normal rate, regular rhythm, normal heart sounds and intact distal pulses.  Exam reveals no gallop and no friction rub.   No murmur heard. Pulmonary/Chest: Effort normal and breath sounds normal. She has no wheezes. She has no rales.  Abdominal: Soft. Bowel sounds are normal. She exhibits no distension and no mass. There is tenderness in the left lower quadrant. There is no rigidity, no rebound, no guarding and no CVA tenderness. No hernia. Hernia confirmed negative in the ventral area.  Lymphadenopathy:    She has no cervical adenopathy.  Neurological: She is alert and oriented to person, place, and time. No cranial nerve deficit.  Skin: Skin is warm and dry. No rash noted. She is not diaphoretic. No erythema. No pallor.  Psychiatric: She has a normal mood and affect. Her behavior is normal.    CLINICAL DATA: RIGHT flank pain since 11:30 a.m. Nausea.  EXAM: CT ABDOMEN AND PELVIS WITHOUT CONTRAST  TECHNIQUE: Multidetector CT imaging of the abdomen and pelvis was performed following the standard protocol without IV contrast.  COMPARISON: None.  FINDINGS: Lower chest: Lung bases are clear.  Hepatobiliary: No focal hepatic lesion. No biliary duct dilatation. Gallbladder is normal. Common bile duct is normal.  Pancreas: Pancreas is normal. No ductal dilatation. No pancreatic inflammation.  Spleen: Normal spleen  Adrenals/urinary tract: Adrenal glands are normal. There is a 1 mm calculus within the mid  RIGHT kidney on image 47, series 2. No ureterolithiasis or obstructive uropathy. No bladder calculi. Multiple calcifications along the body uterus are felt to be vascular.  Stomach/Bowel: Stomach, small bowel, appendix, and cecum are normal. The colon and rectosigmoid colon are normal.  Vascular/Lymphatic: Abdominal aorta is normal caliber. There is no retroperitoneal or periportal lymphadenopathy. No pelvic lymphadenopathy.  Reproductive: Uterus and ovaries are normal. Tampon in the vagina.  Musculoskeletal: No aggressive osseous lesion.  Other: No free fluid.  IMPRESSION: 1. Punctate RIGHT nephrolithiasis. 2. No ureterolithiasis or obstructive uropathy. 3. Normal appendix.   Electronically Signed  By: Suzy Bouchard M.D.  On: 12/06/2014 14:47   Results for orders placed or performed in visit on 03/28/15  POCT CBC  Result Value Ref Range   WBC 7.7 4.6 - 10.2 K/uL   Lymph, poc 2.5 0.6 - 3.4   POC LYMPH PERCENT 32.8 10 - 50 %L   MID (cbc) 0.2 0 - 0.9   POC MID % 2.9 0 - 12 %M   POC Granulocyte 5.0 2 - 6.9   Granulocyte percent 64.3 37 - 80 %G   RBC 5.09  4.04 - 5.48 M/uL   Hemoglobin 14.3 12.2 - 16.2 g/dL   HCT, POC 41.7 37.7 - 47.9 %   MCV 82.0 80 - 97 fL   MCH, POC 28.0 27 - 31.2 pg   MCHC 34.2 31.8 - 35.4 g/dL   RDW, POC 12.7 %   Platelet Count, POC 322 142 - 424 K/uL   MPV 7.9 0 - 99.8 fL  POCT urinalysis dipstick  Result Value Ref Range   Color, UA yellow yellow   Clarity, UA clear clear   Glucose, UA negative negative   Bilirubin, UA negative negative   Ketones, POC UA negative negative   Spec Grav, UA >=1.030    Blood, UA moderate (A) negative   pH, UA 5.5    Protein Ur, POC negative negative   Urobilinogen, UA 0.2    Nitrite, UA Negative Negative   Leukocytes, UA Negative Negative  POCT Microscopic Urinalysis ([facility])  Result Value Ref Range   WBC,UR,HPF,POC Few (A) None WBC/hpf   RBC,UR,HPF,POC Few (A) None RBC/hpf   Bacteria Few (A)  None, Too numerous to count   Mucus Present (A) Absent   Epithelial Cells, UR Per Microscopy Few (A) None, Too numerous to count cells/hpf  POCT urine pregnancy  Result Value Ref Range   Preg Test, Ur Negative Negative       Assessment & Plan:   1. Left flank pain   2. Non-intractable vomiting with nausea, unspecified vomiting type   3. Need for prophylactic vaccination and inoculation against influenza   4. History of nephrolithiasis   5. Hematuria    -New and now much improved from onset 48 hours ago. -urine with hematuria to suggest recent stone.  Recommend repeat u/a in upcoming month. -recommend aggressive hydration.   -s/p flu vaccine.   Orders Placed This Encounter  Procedures  . Flu Vaccine QUAD 36+ mos IM  . Comprehensive metabolic panel  . POCT CBC  . POCT urinalysis dipstick  . POCT Microscopic Urinalysis ([facility])  . POCT urine pregnancy   No orders of the defined types were placed in this encounter.    No Follow-up on file.    Mckay Tegtmeyer Elayne Guerin, M.D. [facility] [address] [place], [place]  [address] [phone] phone [phone] fax

## 2015-03-29 LAB — COMPREHENSIVE METABOLIC PANEL
ALK PHOS: 81 U/L (ref 33–115)
ALT: 37 U/L — AB (ref 6–29)
AST: 20 U/L (ref 10–30)
Albumin: 4.2 g/dL (ref 3.6–5.1)
BILIRUBIN TOTAL: 0.4 mg/dL (ref 0.2–1.2)
BUN: 17 mg/dL (ref 7–25)
CO2: 25 mmol/L (ref 20–31)
CREATININE: 0.73 mg/dL (ref 0.50–1.10)
Calcium: 9.3 mg/dL (ref 8.6–10.2)
Chloride: 105 mmol/L (ref 98–110)
Glucose, Bld: 91 mg/dL (ref 65–99)
Potassium: 4.3 mmol/L (ref 3.5–5.3)
SODIUM: 140 mmol/L (ref 135–146)
TOTAL PROTEIN: 7.1 g/dL (ref 6.1–8.1)

## 2015-04-11 ENCOUNTER — Encounter: Payer: Self-pay | Admitting: Internal Medicine

## 2015-04-11 ENCOUNTER — Ambulatory Visit (INDEPENDENT_AMBULATORY_CARE_PROVIDER_SITE_OTHER): Payer: BC Managed Care – PPO | Admitting: Internal Medicine

## 2015-04-11 VITALS — BP 126/88 | HR 78 | Temp 98.3°F | Wt 236.0 lb

## 2015-04-11 DIAGNOSIS — K0889 Other specified disorders of teeth and supporting structures: Secondary | ICD-10-CM | POA: Diagnosis not present

## 2015-04-11 DIAGNOSIS — B9789 Other viral agents as the cause of diseases classified elsewhere: Secondary | ICD-10-CM

## 2015-04-11 DIAGNOSIS — B349 Viral infection, unspecified: Secondary | ICD-10-CM | POA: Diagnosis not present

## 2015-04-11 DIAGNOSIS — J329 Chronic sinusitis, unspecified: Secondary | ICD-10-CM

## 2015-04-11 NOTE — Patient Instructions (Signed)

## 2015-04-11 NOTE — Progress Notes (Signed)
Pre visit review using our clinic review tool, if applicable. No additional management support is needed unless otherwise documented below in the visit note. 

## 2015-04-11 NOTE — Progress Notes (Signed)
HPI  Pt presents to the clinic today with c/o left sided facial pressure, ear pain and dental pain. This started 2 days ago. She admits to nasal congestion and runny nose with clear sometimes blood tinged mucus. She has a tooth that is cracked with a cavity that needs dental attention on the left side, but she has been waiting for her insurance card to be delivered. She denies changes in hearing, eye pain/watering, chest pain, shortness of breath or cough. She denies fever, chills or body aches. She has a history of allergies which she treats with Zyrtec in the spring and is currently using Flonase once daily. She has been taking Ibuprofen OTC for her tooth pain.  Review of Systems      Past Medical History  Diagnosis Date  . Mastodynia   . Allergic rhinitis due to pollen     Family History  Problem Relation Age of Onset  . Hypertension Maternal Grandmother   . Diabetes Maternal Grandmother   . Heart disease Neg Hx   . Depression Paternal Grandmother     Social History   Social History  . Marital Status: Married    Spouse Name: N/A  . Number of Children: 2  . Years of Education: N/A   Occupational History  . [place] History Main Topics  . Smoking status: Former Smoker    Quit date: 01/04/2010  . Smokeless tobacco: Never Used  . Alcohol Use: 1.2 oz/week    2 Cans of beer per week     Comment: rare  . Drug Use: No  . Sexual Activity: Not on file     Comment: Vasectomy   Other Topics Concern  . Not on file   Social History Narrative   2nd marriage    Allergies  Allergen Reactions  . Amoxicillin     itching  . Clindamycin/Lincomycin     Caused a superinfection  . Oxycodone-Acetaminophen Hives     Constitutional: Denies  fatigue, fever, or abrupt weight changes.  HEENT:  Positive runny nose, nasal congestion, facial pain, and intermittent left ear pain. Denies eye redness, eye pain or pressure behind the eyes. Respiratory:  Denies cough, difficulty breathing or shortness of breath.  Cardiovascular: Denies chest pain or chest tightness.  No other specific complaints in a complete review of systems (except as listed in HPI above).  Objective:   BP 126/88 mmHg  Pulse 78  Temp(Src) 98.3 F (36.8 C) (Oral)  Wt 236 lb (107.049 kg)  SpO2 98%  LMP 03/23/2015 Wt Readings from Last 3 Encounters:  04/11/15 236 lb (107.049 kg)  03/28/15 234 lb (106.142 kg)  12/06/14 240 lb (108.863 kg)     General: Appears her stated age,  in NAD. HEENT: Head: normal shape and size, no sinus tenderness noted; Eyes: sclera white, no icterus, conjunctiva pink; Ears: Tm's gray and intact with serous fluid behind left TM, normal light reflex; Nose: mucosa pink and moist, septum midline; Throat/Mouth:  Teeth present with tooth on left side of mouth cracked and cavity present. Mucosa erythematous and moist, no exudate noted, no lesions or ulcerations noted.  Neck: Negative cervical lymphadenopathy.  Cardiovascular: Normal rate and rhythm. S1,S2 noted.  No murmur, rubs or gallops noted.  Pulmonary/Chest: Normal effort and positive vesicular breath sounds. No respiratory distress. No wheezes, rales or ronchi noted.      Assessment & Plan:   Dental Pain:  Continue Ibuprofen PRN Schedule dental appointment when able No abx  indicated, no s/s of infection  Viral Sinusitis:  Flonase BID until symptoms resolve Depomedrol 80mg  IM injection  Continue Flonase Advised her to add in Zyrtec Discussed signs of bacterial infections and patient instructed to call if any s/s of bacterial infection occur   RTC as needed or if symptoms persist.

## 2015-11-20 ENCOUNTER — Encounter: Payer: Self-pay | Admitting: Internal Medicine

## 2015-11-20 ENCOUNTER — Ambulatory Visit (INDEPENDENT_AMBULATORY_CARE_PROVIDER_SITE_OTHER): Payer: BC Managed Care – PPO | Admitting: Internal Medicine

## 2015-11-20 DIAGNOSIS — J069 Acute upper respiratory infection, unspecified: Secondary | ICD-10-CM | POA: Diagnosis not present

## 2015-11-20 NOTE — Progress Notes (Signed)
   Subjective:    Patient ID: Ariana Mcdaniel, female    DOB: [birth year], 40 y.o.   MRN: [id number]  HPI Here due to respiratory illness Thinks it is really bad cold Had to miss work so needs visit  Started with bad sore throat and headache-- 4 days ago Next day--throat better--but then body aches and drainage Some cough--mostly dry Slight SOB--like going into her chest somewhat Low grade fever--100.8 this morning No chills or sweats Some ear pain  No current outpatient prescriptions on file prior to visit.   No current facility-administered medications on file prior to visit.     Allergies  Allergen Reactions  . Amoxicillin     itching  . Clindamycin/Lincomycin     Caused a superinfection  . Oxycodone-Acetaminophen Hives    Past Medical History:  Diagnosis Date  . Allergic rhinitis due to pollen   . Mastodynia     Past Surgical History:  Procedure Laterality Date  . TONSILLECTOMY    . WISDOM TOOTH EXTRACTION      Family History  Problem Relation Age of Onset  . Hypertension Maternal Grandmother   . Diabetes Maternal Grandmother   . Heart disease Neg Hx   . Depression Paternal Grandmother     Social History   Social History  . Marital status: Married    Spouse name: N/A  . Number of children: 2  . Years of education: N/A   Occupational History  . [employer] History Main Topics  . Smoking status: Former Smoker    Quit date: 01/04/2010  . Smokeless tobacco: Never Used  . Alcohol use 1.2 oz/week    2 Cans of beer per week     Comment: rare  . Drug use: No  . Sexual activity: Not on file     Comment: Vasectomy   Other Topics Concern  . Not on file   Social History Narrative   2nd marriage   Tried mucinex multi symptom med--some help  Review of Systems Able to sleep No N/V Not hungry--but able to eat No rash    Objective:   Physical Exam  Constitutional: She appears well-developed. No distress.  HENT:  No  sinus tenderness TMs normal Moderate nasal inflammation Slight pharyngeal injection only  Neck: Normal range of motion. No thyromegaly present.  Pulmonary/Chest: Effort normal and breath sounds normal. No respiratory distress. She has no wheezes. She has no rales.  Lymphadenopathy:    She has no cervical adenopathy.          Assessment & Plan:

## 2015-11-20 NOTE — Progress Notes (Signed)
Pre visit review using our clinic review tool, if applicable. No additional management support is needed unless otherwise documented below in the visit note. 

## 2015-11-20 NOTE — Assessment & Plan Note (Signed)
Seems to be viral infection Discussed symptom relief--especially analgesics Discussed signs of secondary sinus infection--she will let me know (especially if persists to next week)

## 2016-06-12 ENCOUNTER — Encounter: Payer: Self-pay | Admitting: *Deleted

## 2016-06-12 ENCOUNTER — Encounter: Payer: Self-pay | Admitting: Internal Medicine

## 2016-06-12 ENCOUNTER — Ambulatory Visit (INDEPENDENT_AMBULATORY_CARE_PROVIDER_SITE_OTHER): Payer: BC Managed Care – PPO | Admitting: Internal Medicine

## 2016-06-12 VITALS — BP 112/66 | HR 111 | Temp 98.1°F | Wt 227.5 lb

## 2016-06-12 DIAGNOSIS — J019 Acute sinusitis, unspecified: Secondary | ICD-10-CM | POA: Diagnosis not present

## 2016-06-12 MED ORDER — PREDNISONE 10 MG PO TABS: ORAL_TABLET | ORAL | 0 refills | Status: DC

## 2016-06-12 NOTE — Progress Notes (Signed)
Pre visit review using our clinic review tool, if applicable. No additional management support is needed unless otherwise documented below in the visit note. 

## 2016-06-12 NOTE — Progress Notes (Signed)
HPI  Pt presents to the clinic today with c/o headache, nasal congestion and cough. This started 6 days ago. She describes the headache as pressure. She is not blowing anything out of her nose. The cough is nonproductive. She denies fever, chills or body aches. She has tried Dayquil with some relief. She has a history of allergies and was camping prior to onset of her symptoms. She has not had sick contacts that she is aware of.   Review of Systems     Past Medical History:  Diagnosis Date  . Allergic rhinitis due to pollen   . Mastodynia     Family History  Problem Relation Age of Onset  . Hypertension Maternal Grandmother   . Diabetes Maternal Grandmother   . Heart disease Neg Hx   . Depression Paternal Grandmother     Social History   Social History  . Marital status: Married    Spouse name: N/A  . Number of children: 2  . Years of education: N/A   Occupational History  . [employer] History Main Topics  . Smoking status: Former Smoker    Quit date: 01/04/2010  . Smokeless tobacco: Never Used  . Alcohol use 1.2 oz/week    2 Cans of beer per week     Comment: rare  . Drug use: No  . Sexual activity: Not on file     Comment: Vasectomy   Other Topics Concern  . Not on file   Social History Narrative   2nd marriage    Allergies  Allergen Reactions  . Amoxicillin     itching  . Clindamycin/Lincomycin     Caused a superinfection  . Oxycodone-Acetaminophen Hives     Constitutional: Positive headache Denies fatigue, fever or abrupt weight changes.  HEENT:  Positive nasal congestion. Denies eye redness, ear pain, ringing in the ears, wax buildup, runny nose or sore thrat. Respiratory: Positive cough. Denies difficulty breathing or shortness of breath.  Cardiovascular: Denies chest pain, chest tightness, palpitations or swelling in the hands or feet.   No other specific complaints in a complete review of systems (except as listed  in HPI above).  Objective:   BP 112/66   Pulse (!) 111   Temp 98.1 F (36.7 C) (Oral)   Wt 227 lb 8 oz (103.2 kg)   LMP 05/30/2016   SpO2 97%   BMI 40.30 kg/m   General: Appears her stated age, in NAD. HEENT: Head: normal shape and size, no sinus tenderness noted; Ears: Tm's red but intact, normal light reflex, + serous effusion bilaterally; Nose: mucosa pink and moist, septum midline; Throat/Mouth: + PND. Teeth present, mucosa pink and moist, no exudate noted, no lesions or ulcerations noted.  Neck:  No adenopathy noted.  Cardiovascular: Normal rate and rhythm.  Pulmonary/Chest: Normal effort and positive vesicular breath sounds. No respiratory distress. No wheezes, rales or ronchi noted.       Assessment & Plan:   Sinusitis:  Can use a Neti Pot which can be purchased from your local drug store. eRx for Pred Taper x 6 days  RTC as needed or if symptoms persist. Webb Silversmith, NP

## 2016-06-12 NOTE — Patient Instructions (Signed)

## 2016-07-25 ENCOUNTER — Ambulatory Visit: Payer: BC Managed Care – PPO | Admitting: Obstetrics and Gynecology

## 2016-10-27 ENCOUNTER — Ambulatory Visit: Payer: BC Managed Care – PPO | Admitting: Family Medicine

## 2016-11-16 NOTE — Progress Notes (Signed)
Normal pap 07/12/12 Long heavy periods - desires ablation

## 2016-11-17 ENCOUNTER — Encounter: Payer: Self-pay | Admitting: Family Medicine

## 2016-11-17 ENCOUNTER — Ambulatory Visit (INDEPENDENT_AMBULATORY_CARE_PROVIDER_SITE_OTHER): Payer: BC Managed Care – PPO | Admitting: Family Medicine

## 2016-11-17 VITALS — BP 141/83 | HR 84 | Ht 62.0 in | Wt 235.0 lb

## 2016-11-17 DIAGNOSIS — Z124 Encounter for screening for malignant neoplasm of cervix: Secondary | ICD-10-CM | POA: Diagnosis not present

## 2016-11-17 DIAGNOSIS — N920 Excessive and frequent menstruation with regular cycle: Secondary | ICD-10-CM

## 2016-11-17 DIAGNOSIS — Z23 Encounter for immunization: Secondary | ICD-10-CM

## 2016-11-17 DIAGNOSIS — Z01411 Encounter for gynecological examination (general) (routine) with abnormal findings: Secondary | ICD-10-CM

## 2016-11-17 DIAGNOSIS — Z01419 Encounter for gynecological examination (general) (routine) without abnormal findings: Secondary | ICD-10-CM | POA: Diagnosis not present

## 2016-11-17 DIAGNOSIS — Z1151 Encounter for screening for human papillomavirus (HPV): Secondary | ICD-10-CM

## 2016-11-17 MED ORDER — MEGESTROL ACETATE 40 MG PO TABS: 40.0000 mg | ORAL_TABLET | Freq: Two times a day (BID) | ORAL | 3 refills | Status: DC

## 2016-11-17 NOTE — Progress Notes (Signed)
Subjective:     Ariana Mcdaniel is a 41 y.o. female and is here for a comprehensive physical exam. The patient reports problems - "bad periods". Reports her periods are crampy and her cycles are heavy. Lasts 4-5 days and monthly. Seem to be heavier in the last year. Works in the [employer] and has issues with needing frequent changes. She is uninterested in IUD or OCP's to help with this.  Social History   Social History  . Marital status: Married    Spouse name: N/A  . Number of children: 2  . Years of education: N/A   Occupational History  . [place] History Main Topics  . Smoking status: Former Smoker    Quit date: 01/04/2010  . Smokeless tobacco: Never Used  . Alcohol use 1.2 oz/week    2 Cans of beer per week  . Drug use: No  . Sexual activity: Yes    Partners: Male    Birth control/ protection: None     Comment: Vasectomy   Other Topics Concern  . Not on file   Social History Narrative   2nd marriage   Health Maintenance  Topic Date Due  . HIV Screening  01/30/1993  . TETANUS/TDAP  01/30/1997  . PAP SMEAR  07/13/2015  . INFLUENZA VACCINE  12/08/2016 (Originally 10/08/2016)    The following portions of the patient's history were reviewed and updated as appropriate: allergies, current medications, past family history, past medical history, past social history, past surgical history and problem list.  Review of Systems Pertinent items noted in HPI and remainder of comprehensive ROS otherwise negative.   Objective:    BP (!) 141/83   Pulse 84   Ht 5\' 2"  (1.575 m)   Wt 235 lb (106.6 kg)   LMP 10/20/2016   BMI 42.98 kg/m  General appearance: alert, cooperative and appears stated age Head: Normocephalic, without obvious abnormality, atraumatic Neck: no adenopathy, supple, symmetrical, trachea midline and thyroid not enlarged, symmetric, no tenderness/mass/nodules Lungs: clear to auscultation bilaterally Breasts: normal  appearance, no masses or tenderness Heart: regular rate and rhythm, S1, S2 normal, no murmur, click, rub or gallop Abdomen: soft, non-tender; bowel sounds normal; no masses,  no organomegaly Pelvic: cervix normal in appearance, external genitalia normal, no adnexal masses or tenderness, no cervical motion tenderness, uterus normal size, shape, and consistency and vagina normal without discharge Extremities: extremities normal, atraumatic, no cyanosis or edema Pulses: 2+ and symmetric Skin: Skin color, texture, turgor normal. No rashes or lesions Lymph nodes: Cervical, supraclavicular, and axillary nodes normal. Neurologic: Grossly normal    Assessment:    GYN female exam.      Plan:      Problem List Items Addressed This Visit      Unprioritized   Menorrhagia with regular cycle    R/B/A of endometrial ablation discussed at length--she agrees to proceed with this. Risks include but are not limited to bleeding, infection, injury to surrounding structures, including bowel, bladder and ureters, blood clots, and death.  Likelihood of success is high. Her husband has had a vasectomy--so does not need concomitant BTL.       Relevant Medications   megestrol (MEGACE) 40 MG tablet    Other Visit Diagnoses    Well woman exam with routine gynecological exam    -  Primary   Relevant Orders   TSH   CBC   CMP and Liver   Lipid panel   Cytology -  PAP   Flu Vaccine QUAD 36+ mos IM (Fluarix, Quad PF) (Completed)   Screening for malignant neoplasm of cervix       Encounter for gynecological examination with abnormal finding          See After Visit Summary for Counseling Recommendations

## 2016-11-17 NOTE — Patient Instructions (Addendum)
Endometrial Ablation Endometrial ablation is a procedure that destroys the thin inner layer of the lining of the uterus (endometrium). This procedure may be done:  To stop heavy periods.  To stop bleeding that is causing anemia.  To control irregular bleeding.  To treat bleeding caused by small tumors (fibroids) in the endometrium.  This procedure is often an alternative to major surgery, such as removal of the uterus and cervix (hysterectomy). As a result of this procedure:  You may not be able to have children. However, if you are premenopausal (you have not gone through menopause): ? You may still have a small chance of getting pregnant. ? You will need to use a reliable method of birth control after the procedure to prevent pregnancy.  You may stop having a menstrual period, or you may have only a small amount of bleeding during your period. Menstruation may return several years after the procedure.  Tell a health care provider about:  Any allergies you have.  All medicines you are taking, including vitamins, herbs, eye drops, creams, and over-the-counter medicines.  Any problems you or family members have had with the use of anesthetic medicines.  Any blood disorders you have.  Any surgeries you have had.  Any medical conditions you have. What are the risks? Generally, this is a safe procedure. However, problems may occur, including:  A hole (perforation) in the uterus or bowel.  Infection of the uterus, bladder, or vagina.  Bleeding.  Damage to other structures or organs.  An air bubble in the lung (air embolus).  Problems with pregnancy after the procedure.  Failure of the procedure.  Decreased ability to diagnose cancer in the endometrium.  What happens before the procedure?  You will have tests of your endometrium to make sure there are no pre-cancerous cells or cancer cells present.  You may have an ultrasound of the uterus.  You may be given  medicines to thin the endometrium.  Ask your health care provider about: ? Changing or stopping your regular medicines. This is especially important if you take diabetes medicines or blood thinners. ? Taking medicines such as aspirin and ibuprofen. These medicines can thin your blood. Do not take these medicines before your procedure if your doctor tells you not to.  Plan to have someone take you home from the hospital or clinic. What happens during the procedure?  You will lie on an exam table with your feet and legs supported as in a pelvic exam.  To lower your risk of infection: ? Your health care team will wash or sanitize their hands and put on germ-free (sterile) gloves. ? Your genital area will be washed with soap.  An IV tube will be inserted into one of your veins.  You will be given a medicine to help you relax (sedative).  A surgical instrument with a light and camera (resectoscope) will be inserted into your vagina and moved into your uterus. This allows your surgeon to see inside your uterus.  Endometrial tissue will be removed using one of the following methods: ? Radiofrequency. This method uses a radiofrequency-alternating electric current to remove the endometrium. ? Cryotherapy. This method uses extreme cold to freeze the endometrium. ? Heated-free liquid. This method uses a heated saltwater (saline) solution to remove the endometrium. ? Microwave. This method uses high-energy microwaves to heat up the endometrium and remove it. ? Thermal balloon. This method involves inserting a catheter with a balloon tip into the uterus. The balloon tip is   filled with heated fluid to remove the endometrium. The procedure may vary among health care providers and hospitals. What happens after the procedure?  Your blood pressure, heart rate, breathing rate, and blood oxygen level will be monitored until the medicines you were given have worn off.  As tissue healing occurs, you may  notice vaginal bleeding for 4-6 weeks after the procedure. You may also experience: ? Cramps. ? Thin, watery vaginal discharge that is light pink or brown in color. ? A need to urinate more frequently than usual. ? Nausea.  Do not drive for 24 hours if you were given a sedative.  Do not have sex or insert anything into your vagina until your health care provider approves. Summary  Endometrial ablation is done to treat the many causes of heavy menstrual bleeding.  The procedure may be done only after medications have been tried to control the bleeding.  Plan to have someone take you home from the hospital or clinic. This information is not intended to replace advice given to you by your health care provider. Make sure you discuss any questions you have with your health care provider. Document Released: 01/04/2004 Document Revised: 03/13/2016 Document Reviewed: 03/13/2016 Elsevier Interactive Patient Education  2017 [place] 18-39 Years, Female Preventive care refers to lifestyle choices and visits with your health care provider that can promote health and wellness. What does preventive care include?  A yearly physical exam. This is also called an annual well check.  Dental exams once or twice a year.  Routine eye exams. Ask your health care provider how often you should have your eyes checked.  Personal lifestyle choices, including: ? Daily care of your teeth and gums. ? Regular physical activity. ? Eating a healthy diet. ? Avoiding tobacco and drug use. ? Limiting alcohol use. ? Practicing safe sex. ? Taking vitamin and mineral supplements as recommended by your health care provider. What happens during an annual well check? The services and screenings done by your health care provider during your annual well check will depend on your age, overall health, lifestyle risk factors, and family history of disease. Counseling Your health care provider may  ask you questions about your:  Alcohol use.  Tobacco use.  Drug use.  Emotional well-being.  Home and relationship well-being.  Sexual activity.  Eating habits.  Work and work Statistician.  Method of birth control.  Menstrual cycle.  Pregnancy history.  Screening You may have the following tests or measurements:  Height, weight, and BMI.  Diabetes screening. This is done by checking your blood sugar (glucose) after you have not eaten for a while (fasting).  Blood pressure.  Lipid and cholesterol levels. These may be checked every 5 years starting at age 11.  Skin check.  Hepatitis C blood test.  Hepatitis B blood test.  Sexually transmitted disease (STD) testing.  BRCA-related cancer screening. This may be done if you have a family history of breast, ovarian, tubal, or peritoneal cancers.  Pelvic exam and Pap test. This may be done every 3 years starting at age 46. Starting at age 42, this may be done every 5 years if you have a Pap test in combination with an HPV test.  Discuss your test results, treatment options, and if necessary, the need for more tests with your health care provider. Vaccines Your health care provider may recommend certain vaccines, such as:  Influenza vaccine. This is recommended every year.  Tetanus, diphtheria, and acellular pertussis (  Tdap, Td) vaccine. You may need a Td booster every 10 years.  Varicella vaccine. You may need this if you have not been vaccinated.  HPV vaccine. If you are 52 or younger, you may need three doses over 6 months.  Measles, mumps, and rubella (MMR) vaccine. You may need at least one dose of MMR. You may also need a second dose.  Pneumococcal 13-valent conjugate (PCV13) vaccine. You may need this if you have certain conditions and were not previously vaccinated.  Pneumococcal polysaccharide (PPSV23) vaccine. You may need one or two doses if you smoke cigarettes or if you have certain  conditions.  Meningococcal vaccine. One dose is recommended if you are age 38-21 years and a first-year college student living in a residence hall, or if you have one of several medical conditions. You may also need additional booster doses.  Hepatitis A vaccine. You may need this if you have certain conditions or if you travel or work in places where you may be exposed to hepatitis A.  Hepatitis B vaccine. You may need this if you have certain conditions or if you travel or work in places where you may be exposed to hepatitis B.  Haemophilus influenzae type b (Hib) vaccine. You may need this if you have certain risk factors.  Talk to your health care provider about which screenings and vaccines you need and how often you need them. This information is not intended to replace advice given to you by your health care provider. Make sure you discuss any questions you have with your health care provider. Document Released: 04/22/2001 Document Revised: 11/14/2015 Document Reviewed: 12/26/2014 Elsevier Interactive Patient Education  2017 [employer].

## 2016-11-17 NOTE — Assessment & Plan Note (Signed)
R/B/A of endometrial ablation discussed at length--she agrees to proceed with this. Risks include but are not limited to bleeding, infection, injury to surrounding structures, including bowel, bladder and ureters, blood clots, and death.  Likelihood of success is high. Her husband has had a vasectomy--so does not need concomitant BTL.

## 2016-11-18 ENCOUNTER — Encounter (HOSPITAL_COMMUNITY): Payer: Self-pay

## 2016-11-18 LAB — CBC
HEMOGLOBIN: 13.3 g/dL (ref 11.1–15.9)
Hematocrit: 40.3 % (ref 34.0–46.6)
MCH: 27.8 pg (ref 26.6–33.0)
MCHC: 33 g/dL (ref 31.5–35.7)
MCV: 84 fL (ref 79–97)
PLATELETS: 300 10*3/uL (ref 150–379)
RBC: 4.79 x10E6/uL (ref 3.77–5.28)
RDW: 13.1 % (ref 12.3–15.4)
WBC: 8.2 10*3/uL (ref 3.4–10.8)

## 2016-11-18 LAB — CMP AND LIVER
ALBUMIN: 3.9 g/dL (ref 3.5–5.5)
ALT: 37 IU/L — AB (ref 0–32)
AST: 19 IU/L (ref 0–40)
Alkaline Phosphatase: 92 IU/L (ref 39–117)
BILIRUBIN TOTAL: 0.2 mg/dL (ref 0.0–1.2)
BUN: 10 mg/dL (ref 6–20)
Bilirubin, Direct: 0.07 mg/dL (ref 0.00–0.40)
CO2: 24 mmol/L (ref 20–29)
CREATININE: 0.88 mg/dL (ref 0.57–1.00)
Calcium: 9.4 mg/dL (ref 8.7–10.2)
Chloride: 104 mmol/L (ref 96–106)
GFR calc Af Amer: 96 mL/min/{1.73_m2} (ref >59)
GFR calc non Af Amer: 84 mL/min/{1.73_m2} (ref >59)
GLUCOSE: 89 mg/dL (ref 65–99)
POTASSIUM: 4.1 mmol/L (ref 3.5–5.2)
Sodium: 141 mmol/L (ref 134–144)
TOTAL PROTEIN: 7 g/dL (ref 6.0–8.5)

## 2016-11-18 LAB — LIPID PANEL
CHOLESTEROL TOTAL: 213 mg/dL — AB (ref 100–199)
Chol/HDL Ratio: 4.2 ratio (ref 0.0–4.4)
HDL: 51 mg/dL (ref >39)
LDL CALC: 108 mg/dL — AB (ref 0–99)
TRIGLYCERIDES: 269 mg/dL — AB (ref 0–149)
VLDL Cholesterol Cal: 54 mg/dL — ABNORMAL HIGH (ref 5–40)

## 2016-11-18 LAB — TSH: TSH: 1.99 u[IU]/mL (ref 0.450–4.500)

## 2016-11-20 LAB — CYTOLOGY - PAP
Diagnosis: NEGATIVE
HPV (WINDOPATH): NOT DETECTED

## 2016-12-24 ENCOUNTER — Ambulatory Visit (INDEPENDENT_AMBULATORY_CARE_PROVIDER_SITE_OTHER): Payer: BC Managed Care – PPO | Admitting: Primary Care

## 2016-12-24 ENCOUNTER — Encounter: Payer: Self-pay | Admitting: Primary Care

## 2016-12-24 VITALS — BP 120/76 | HR 75 | Temp 98.4°F | Ht 62.0 in | Wt 236.8 lb

## 2016-12-24 DIAGNOSIS — S80861A Insect bite (nonvenomous), right lower leg, initial encounter: Secondary | ICD-10-CM

## 2016-12-24 DIAGNOSIS — W57XXXA Bitten or stung by nonvenomous insect and other nonvenomous arthropods, initial encounter: Secondary | ICD-10-CM | POA: Diagnosis not present

## 2016-12-24 MED ORDER — DOXYCYCLINE HYCLATE 100 MG PO TABS: 100.0000 mg | ORAL_TABLET | Freq: Two times a day (BID) | ORAL | 0 refills | Status: DC

## 2016-12-24 NOTE — Patient Instructions (Signed)
Complete lab work prior to leaving today. I will notify you of your results once received.   Start Doxycycline antibiotic. Take 1 tablet by mouth twice daily for 7 days.  Continue Ibuprofen as needed. You may take 800 mg every 8 hours.  Please notify me if no improvement in 3-4 days.  It was a pleasure meeting you!

## 2016-12-24 NOTE — Progress Notes (Signed)
Subjective:    Patient ID: Ariana Mcdaniel, female    DOB: Jul 25, 1975, 41 y.o.   MRN: [id number]  HPI  Ariana Mcdaniel is a 41 year old female who presents today with a chief complaint of tick bite. Her tick bite is located to the left lateral calf. She pulled a tick off 2 weeks ago. Since then she's noticed swelling, erythema, headaches, body aches, and fever of 101 yesterday. She's been taking Ibuprofen without improvement. The tick bite site has increased in size and erythema since. She denies rashes, drainage.  Review of Systems  Constitutional: Positive for fatigue.  Musculoskeletal: Positive for myalgias.  Skin: Positive for color change and wound. Negative for rash.  Neurological: Positive for headaches.       Past Medical History:  Diagnosis Date  . Allergic rhinitis due to pollen   . Mastodynia      Social History   Social History  . Marital status: Married    Spouse name: N/A  . Number of children: 2  . Years of education: N/A   Occupational History  . [employer] History Main Topics  . Smoking status: Former Smoker    Quit date: 01/04/2010  . Smokeless tobacco: Never Used  . Alcohol use 1.2 oz/week    2 Cans of beer per week  . Drug use: No  . Sexual activity: Yes    Partners: Male    Birth control/ protection: None     Comment: Vasectomy   Other Topics Concern  . Not on file   Social History Narrative   2nd marriage    Past Surgical History:  Procedure Laterality Date  . TONSILLECTOMY    . WISDOM TOOTH EXTRACTION      Family History  Problem Relation Age of Onset  . Hypertension Maternal Grandmother   . Diabetes Maternal Grandmother   . Depression Paternal Grandmother   . Heart disease Neg Hx     Allergies  Allergen Reactions  . Amoxicillin     itching  . Clindamycin/Lincomycin     Caused a superinfection  . Oxycodone-Acetaminophen Hives    Current Outpatient Prescriptions on File Prior to Visit  Medication  Sig Dispense Refill  . megestrol (MEGACE) 40 MG tablet Take 1 tablet (40 mg total) by mouth 2 (two) times daily. 30 tablet 3   No current facility-administered medications on file prior to visit.     BP 120/76   Pulse 75   Temp 98.4 F (36.9 C) (Oral)   Ht 5\' 2"  (1.575 m)   Wt 236 lb 12.8 oz (107.4 kg)   LMP 12/10/2016   SpO2 98%   BMI 43.31 kg/m    Objective:   Physical Exam  Constitutional: She appears well-nourished.  Cardiovascular: Normal rate and regular rhythm.   Pulmonary/Chest: Effort normal and breath sounds normal.  Skin: Skin is warm and dry.     Mild to moderate erythema to tick bite site on left lateral calf. 2 cm circumference of swelling and firm tissue. No rash or drainage. Tender.          Assessment & Plan:  Cellulitis:  Located to left lateral calf, secondary to insect bite. Doesn't appear to be Lyme Disease or RMSF. Labs pending for evaluation of both disease. Will treat for cellulitis with Doxycycline course to cover for potential LD. Also has allergies to Beta Lactam group and Clindamycin.  She will update if no improvement in 3-4 days.  Lucilla Petrenko [place],  NP

## 2016-12-26 LAB — LYME AB/WESTERN BLOT REFLEX: Lyme IgG/IgM Ab: <0.91 {ISR} (ref 0.00–0.90)

## 2016-12-26 LAB — ROCKY MTN SPOTTED FVR ABS PNL(IGG+IGM)
RMSF IGG: NEGATIVE
RMSF IgM: 0.74 index (ref 0.00–0.89)

## 2016-12-28 ENCOUNTER — Emergency Department (HOSPITAL_BASED_OUTPATIENT_CLINIC_OR_DEPARTMENT_OTHER): Payer: BC Managed Care – PPO

## 2016-12-28 ENCOUNTER — Emergency Department (HOSPITAL_BASED_OUTPATIENT_CLINIC_OR_DEPARTMENT_OTHER)
Admission: EM | Admit: 2016-12-28 | Discharge: 2016-12-28 | Disposition: A | Discharge status: Home / Self Care | Payer: BC Managed Care – PPO | Attending: Emergency Medicine | Admitting: Emergency Medicine

## 2016-12-28 ENCOUNTER — Encounter (HOSPITAL_BASED_OUTPATIENT_CLINIC_OR_DEPARTMENT_OTHER): Payer: Self-pay | Admitting: Adult Health

## 2016-12-28 DIAGNOSIS — M79672 Pain in left foot: Secondary | ICD-10-CM | POA: Diagnosis not present

## 2016-12-28 DIAGNOSIS — Y929 Unspecified place or not applicable: Secondary | ICD-10-CM | POA: Insufficient documentation

## 2016-12-28 DIAGNOSIS — Z87891 Personal history of nicotine dependence: Secondary | ICD-10-CM | POA: Diagnosis not present

## 2016-12-28 DIAGNOSIS — Y999 Unspecified external cause status: Secondary | ICD-10-CM | POA: Diagnosis not present

## 2016-12-28 DIAGNOSIS — Y9301 Activity, walking, marching and hiking: Secondary | ICD-10-CM | POA: Diagnosis not present

## 2016-12-28 DIAGNOSIS — S86012A Strain of left Achilles tendon, initial encounter: Secondary | ICD-10-CM | POA: Diagnosis not present

## 2016-12-28 DIAGNOSIS — Z79899 Other long term (current) drug therapy: Secondary | ICD-10-CM | POA: Insufficient documentation

## 2016-12-28 DIAGNOSIS — M79605 Pain in left leg: Secondary | ICD-10-CM | POA: Diagnosis present

## 2016-12-28 DIAGNOSIS — X58XXXA Exposure to other specified factors, initial encounter: Secondary | ICD-10-CM | POA: Diagnosis not present

## 2016-12-28 DIAGNOSIS — M79673 Pain in unspecified foot: Secondary | ICD-10-CM

## 2016-12-28 NOTE — ED Triage Notes (Signed)
While pt was running up a 45 degree ramp yesterday she heard a pop and had had severe pain in her ankle and calf, since then it has been swollen and painful. CMS intact. She is currently taking Doxycycline for tick bite.

## 2016-12-28 NOTE — ED Provider Notes (Signed)
[facility] EMERGENCY DEPARTMENT Provider Note   CSN: [id number] Arrival date & time: 12/28/16  1107   History   Chief Complaint Chief Complaint  Patient presents with  . Leg Pain    HPI Ariana Mcdaniel is a 41 y.o. female.  HPI   41 year old female presents today with complaints of left leg pain.  Patient reports that she was running up a ramp at approximately 45 degree angle when she felt a sharp pain in her left calf.  She notes immediate pain radiating down the leg, difficulty with ambulation and swelling.  She notes swelling down into the ankle which has improved, but still endorses some swelling in the calf.  She notes tenderness to palpation, difficulty with both plantar and dorsiflexion.  She denies any history of the same.  Patient also reports that for several months she has had pain along the lateral aspect of her left foot that has been coming and going.  Patient reports she is currently on doxycycline for tick bite.  Past Medical History:  Diagnosis Date  . Allergic rhinitis due to pollen   . Mastodynia     Patient Active Problem List   Diagnosis Date Noted  . Menorrhagia with regular cycle 11/17/2016  . Hordeolum of right eye 03/08/2014  . Allergic rhinitis due to pollen     Past Surgical History:  Procedure Laterality Date  . TONSILLECTOMY    . WISDOM TOOTH EXTRACTION      OB History    Gravida Para Term Preterm AB Living   2 2 2  0 0 2   SAB TAB Ectopic Multiple Live Births   0 0 0 0 2       Home Medications    Prior to Admission medications   Medication Sig Start Date End Date Taking? Authorizing Provider  doxycycline (VIBRA-TABS) 100 MG tablet Take 1 tablet (100 mg total) by mouth 2 (two) times daily. 12/24/16   Pleas Koch, NP  megestrol (MEGACE) 40 MG tablet Take 1 tablet (40 mg total) by mouth 2 (two) times daily. 11/17/16   Donnamae Jude, MD    Family History Family History  Problem Relation Age of Onset  . Hypertension  Maternal Grandmother   . Diabetes Maternal Grandmother   . Depression Paternal Grandmother   . Heart disease Neg Hx     Social History Social History  Substance Use Topics  . Smoking status: Former Smoker    Quit date: 01/04/2010  . Smokeless tobacco: Never Used  . Alcohol use 1.2 oz/week    2 Cans of beer per week     Allergies   Amoxicillin; Clindamycin/lincomycin; and Oxycodone-acetaminophen   Review of Systems Review of Systems  All other systems reviewed and are negative.    Physical Exam Updated Vital Signs BP (!) 146/89 (BP Location: Left Arm)   Pulse 94   Temp 98.4 F (36.9 C) (Oral)   Resp 18   LMP 12/10/2016   SpO2 98%   Physical Exam  Constitutional: She is oriented to person, place, and time. She appears well-developed and well-nourished.  HENT:  Head: Normocephalic and atraumatic.  Eyes: Pupils are equal, round, and reactive to light. Conjunctivae are normal. Right eye exhibits no discharge. Left eye exhibits no discharge. No scleral icterus.  Neck: Normal range of motion. No JVD present. No tracheal deviation present.  Pulmonary/Chest: Effort normal. No stridor.  Musculoskeletal:  Minor swelling to the left calf, no redness-Thompson test negative-plantar and dorsiflexion intact-sensation  intact-no defect noted in the Achilles tendon- no bruising noted to the ankle or foot  Foot atraumatic nontender to palpation  Neurological: She is alert and oriented to person, place, and time. Coordination normal.  Psychiatric: She has a normal mood and affect. Her behavior is normal. Judgment and thought content normal.  Nursing note and vitals reviewed.    ED Treatments / Results  Labs (all labs ordered are listed, but only abnormal results are displayed) Labs Reviewed - No data to display  EKG  EKG Interpretation None       Radiology Dg Foot Complete Left  Result Date: 12/28/2016 CLINICAL DATA:  Intermittent left foot pain for several months.  Calf swelling. EXAM: LEFT FOOT - COMPLETE 3+ VIEW COMPARISON:  None. FINDINGS: There is no evidence of fracture or dislocation. There is no evidence of arthropathy or other focal bone abnormality. Soft tissues are unremarkable. IMPRESSION: Negative. Electronically Signed   By: Dorise Bullion III M.D   On: 12/28/2016 12:11    Procedures Procedures (including critical care time)  Medications Ordered in ED Medications - No data to display   Initial Impression / Assessment and Plan / ED Course  I have reviewed the triage vital signs and the nursing notes.  Pertinent labs & imaging results that were available during my care of the patient were reviewed by me and considered in my medical decision making (see chart for details).       Final Clinical Impressions(s) / ED Diagnoses   Final diagnoses:  Acute foot pain, unspecified laterality  Rupture of left Achilles tendon, initial encounter    Labs:   Imaging: DG foot complete left  Consults:  Therapeutics:  Discharge Meds:   Assessment/Plan: 41 year old female presents today with acute calf pain.  This is likely muscular strain versus incomplete Achilles tendon rupture.  Patient ambulated in the emergency room without significant difficulty.  She has a negative Thompson test, and has intact plantar and dorsiflexion.  Patient will be placed in a cam boot, given crutches, and instructed to be nonweightbearing until orthopedic follow-up.  Patient was offered pain medication here, she refused.  She is instructed to use ibuprofen or Tylenol as needed, ice, rest, elevation, return with any new or worsening signs or symptoms.  Patient's calf swelling is unlikely secondary to DVT given the acute nature of her symptoms.  Patient will follow-up closely with orthopedic specialist, return precautions given.  She verbalized understanding and agreement to today's plan.      New Prescriptions Discharge Medication List as of 12/28/2016 12:27 PM         Okey Regal, PA-C 12/28/16 1622    Little, Wenda Over[place], MD 12/30/16 [phone]

## 2016-12-28 NOTE — Discharge Instructions (Signed)
Please read attached information. If you experience any new or worsening signs or symptoms please return to the emergency room for evaluation. Please follow-up with your primary care provider or specialist as discussed.  °

## 2016-12-29 ENCOUNTER — Encounter: Payer: Self-pay | Admitting: Family Medicine

## 2016-12-29 ENCOUNTER — Ambulatory Visit (INDEPENDENT_AMBULATORY_CARE_PROVIDER_SITE_OTHER): Payer: BC Managed Care – PPO | Admitting: Family Medicine

## 2016-12-29 DIAGNOSIS — S86819A Strain of other muscle(s) and tendon(s) at lower leg level, unspecified leg, initial encounter: Secondary | ICD-10-CM

## 2016-12-29 NOTE — Patient Instructions (Signed)
You have a calf strain (Grade 2) Compression sleeve or ace wrap to help with swelling and pain if tolerated. Icing for 15 minutes at a time 3-4 times a day for the first 3 days then heat or ice (whichever feels better) beyond this. Cam walker (consider with heel lifts) when up and walking around. Crutches if needed Ok to transition to tennis/running shoes with heel lifts when tolerated. Tylenol and/or ibuprofen as needed for pain. Start ankle range of motion exercises twice a day (up/down and alphabet exercises). When tolerated start theraband strengthening exercises. Advance to two legged calf raises, then one legged, then finally doing these on a step. Follow up with me in 2 weeks.

## 2017-01-01 DIAGNOSIS — S86819A Strain of other muscle(s) and tendon(s) at lower leg level, unspecified leg, initial encounter: Secondary | ICD-10-CM | POA: Insufficient documentation

## 2017-01-01 NOTE — Assessment & Plan Note (Signed)
consistent with Grade 2 based on history, exam, ultrasound.  Compression sleeve or ace wrap.  Icing,  Cam walker with heel lifts.  Crutches only if needed.  Tylenol and/or ibuprofen as needed.  Shown motion exercises to start now.  Advance to theraband strengthening then calf raises.  F/u in 2 weeks.

## 2017-01-01 NOTE — Progress Notes (Signed)
PCP: Venia Carbon, MD  Subjective:   HPI: Patient is a 41 y.o. female here for left leg injury.  Patient reports on 10/20 she was running when she felt a sharp pain and pop in midportion of left lower leg. Some associated swelling but no bruising. Pain level has persisted to 5/10 level, sharp. She is using a boot, crutches, icing, taking ibuprofen which help. Worse with walking, bearing weight. No skin changes, numbness.  Past Medical History:  Diagnosis Date  . Allergic rhinitis due to pollen   . Mastodynia     Current Outpatient Prescriptions on File Prior to Visit  Medication Sig Dispense Refill  . doxycycline (VIBRA-TABS) 100 MG tablet Take 1 tablet (100 mg total) by mouth 2 (two) times daily. 14 tablet 0  . megestrol (MEGACE) 40 MG tablet Take 1 tablet (40 mg total) by mouth 2 (two) times daily. 30 tablet 3   No current facility-administered medications on file prior to visit.     Past Surgical History:  Procedure Laterality Date  . TONSILLECTOMY    . WISDOM TOOTH EXTRACTION      Allergies  Allergen Reactions  . Amoxicillin     itching  . Clindamycin/Lincomycin     Caused a superinfection  . Oxycodone-Acetaminophen Hives    Social History   Social History  . Marital status: Married    Spouse name: N/A  . Number of children: 2  . Years of education: N/A   Occupational History  . [employer] History Main Topics  . Smoking status: Former Smoker    Quit date: 01/04/2010  . Smokeless tobacco: Never Used  . Alcohol use 1.2 oz/week    2 Cans of beer per week  . Drug use: No  . Sexual activity: Yes    Partners: Male    Birth control/ protection: None     Comment: Vasectomy   Other Topics Concern  . Not on file   Social History Narrative   2nd marriage    Family History  Problem Relation Age of Onset  . Hypertension Maternal Grandmother   . Diabetes Maternal Grandmother   . Depression Paternal Grandmother   .  Heart disease Neg Hx     BP 122/84   Pulse 67   Ht 5\' 2"  (1.575 m)   Wt 230 lb (104.3 kg)   LMP 12/10/2016   BMI 42.07 kg/m   Review of Systems: See HPI above.     Objective:  Physical Exam:  Gen: NAD, comfortable in exam room  Left lower leg: No gross deformity, swelling, bruising. FROM ankle and knee - mild pain with full dorsiflexion. TTP at musculotendinous junction of calf/achilles and medial gastroc.  No other tenderness of lower leg.  5/5 strength ankle motions. Negative ant drawer and talar tilt.   Negative syndesmotic compression. Negative calcaneal squeeze. Thompsons test negative. NV intact distally.  Right lower leg: No gross deformity, swelling, bruising. FROM ankle with 5/5 strength. NVI distally.  MSK u/s left lower leg:  Achilles intact.  Edema around medial gastroc distally with partial tear distally.   Assessment & Plan:  1. Left calf strain - consistent with Grade 2 based on history, exam, ultrasound.  Compression sleeve or ace wrap.  Icing,  Cam walker with heel lifts.  Crutches only if needed.  Tylenol and/or ibuprofen as needed.  Shown motion exercises to start now.  Advance to theraband strengthening then calf raises.  F/u in 2 weeks.

## 2017-01-08 DIAGNOSIS — I82409 Acute embolism and thrombosis of unspecified deep veins of unspecified lower extremity: Secondary | ICD-10-CM

## 2017-01-08 HISTORY — DX: Acute embolism and thrombosis of unspecified deep veins of unspecified lower extremity: I82.409

## 2017-01-12 ENCOUNTER — Ambulatory Visit: Payer: BC Managed Care – PPO | Admitting: Family Medicine

## 2017-01-13 ENCOUNTER — Ambulatory Visit (HOSPITAL_COMMUNITY): Admission: RE | Admit: 2017-01-13 | Payer: BC Managed Care – PPO | Source: Ambulatory Visit | Admitting: Family Medicine

## 2017-01-13 ENCOUNTER — Encounter (HOSPITAL_COMMUNITY): Admission: RE | Payer: Self-pay | Source: Ambulatory Visit

## 2017-01-13 SURGERY — DILATATION & CURETTAGE/HYSTEROSCOPY WITH HYDROTHERMAL ABLATION
Anesthesia: Choice

## 2017-01-14 ENCOUNTER — Ambulatory Visit: Payer: Self-pay

## 2017-01-14 ENCOUNTER — Telehealth: Payer: Self-pay | Admitting: Emergency Medicine

## 2017-01-14 ENCOUNTER — Ambulatory Visit (HOSPITAL_COMMUNITY)
Admission: RE | Admit: 2017-01-14 | Discharge: 2017-01-14 | Disposition: A | Discharge status: Home / Self Care | Payer: BC Managed Care – PPO | Source: Ambulatory Visit | Attending: Internal Medicine | Admitting: Internal Medicine

## 2017-01-14 ENCOUNTER — Other Ambulatory Visit: Payer: Self-pay

## 2017-01-14 ENCOUNTER — Ambulatory Visit: Payer: BC Managed Care – PPO | Admitting: Emergency Medicine

## 2017-01-14 ENCOUNTER — Telehealth: Payer: Self-pay | Admitting: *Deleted

## 2017-01-14 ENCOUNTER — Encounter: Payer: Self-pay | Admitting: Emergency Medicine

## 2017-01-14 VITALS — BP 118/72 | HR 80 | Temp 98.9°F | Resp 16 | Ht 62.5 in | Wt 237.0 lb

## 2017-01-14 DIAGNOSIS — I82432 Acute embolism and thrombosis of left popliteal vein: Secondary | ICD-10-CM | POA: Insufficient documentation

## 2017-01-14 DIAGNOSIS — M7989 Other specified soft tissue disorders: Secondary | ICD-10-CM | POA: Insufficient documentation

## 2017-01-14 DIAGNOSIS — I82442 Acute embolism and thrombosis of left tibial vein: Secondary | ICD-10-CM | POA: Insufficient documentation

## 2017-01-14 MED ORDER — RIVAROXABAN (XARELTO) VTE STARTER PACK (15 & 20 MG): ORAL_TABLET | ORAL | 0 refills | Status: DC

## 2017-01-14 NOTE — Telephone Encounter (Signed)
Message left on pt.'s cell phone that there are no appointments for today and that she needs to go urgent care today for evaluation.

## 2017-01-14 NOTE — Telephone Encounter (Signed)
  Reason for Disposition . [1] MODERATE leg swelling (e.g., swelling extends up to knees) AND [2] new onset or worsening  Answer Assessment - Initial Assessment Questions 1. ONSET: "When did the swelling start?" (e.g., minutes, hours, days)     Started 2 days ago 2. LOCATION: "What part of the leg is swollen?"  "Are both legs swollen or just one leg?"     Left calf is normal but ankle and foot are swollen. 3. SEVERITY: "How bad is the swelling?" (e.g., localized; mild, moderate, severe)  - Localized - small area of swelling localized to one leg  - MILD pedal edema - swelling limited to foot and ankle, pitting edema < 1/4 inch (6 mm) deep, rest and elevation eliminate most or all swelling  - MODERATE edema - swelling of lower leg to knee, pitting edema > 1/4 inch (6 mm) deep, rest and elevation only partially reduce swelling  - SEVERE edema - swelling extends above knee, facial or hand swelling present      Moderate; elevating leg does help. 4. REDNESS: "Does the swelling look red or infected?"     Top of foot is red. 5. PAIN: "Is the swelling painful to touch?" If so, ask: "How painful is it?"   (Scale 1-10; mild, moderate or severe)     4 6. FEVER: "Do you have a fever?" If so, ask: "What is it, how was it measured, and when did it start?"      Unsure 7. CAUSE: "What do you think is causing the leg swelling?"     Tore calf muscle two weeks ago. 8. MEDICAL HISTORY: "Do you have a history of heart failure, kidney disease, liver failure, or cancer?"     No 9. RECURRENT SYMPTOM: "Have you had leg swelling before?" If so, ask: "When was the last time?" "What happened that time?"     No 10. OTHER SYMPTOMS: "Do you have any other symptoms?" (e.g., chest pain, difficulty breathing)       No 11. PREGNANCY: "Is there any chance you are pregnant?" "When was your last menstrual period?"       No  Protocols used: LEG SWELLING AND EDEMA-A-AH

## 2017-01-14 NOTE — Telephone Encounter (Signed)
Spoke to patient about medication escribed to her pharmacy to pick up for the DVT. Also, do not take Rx until Dr Mitchel Honour speaks to her about the radiology report.

## 2017-01-14 NOTE — Patient Instructions (Addendum)
You are scheduled for a vascular ultrasound at 4pm at [facility]. Please arrive 15 minutes prior to your scheduled time at the admissions department located at the main entrance.     IF you received an x-ray today, you will receive an invoice from [facility]. Please contact [facility] at [phone] with questions or concerns regarding your invoice.   IF you received labwork today, you will receive an invoice from [facility]. Please contact [facility] at [phone] with questions or concerns regarding your invoice.   Our billing staff will not be able to assist you with questions regarding bills from these companies.  You will be contacted with the lab results as soon as they are available. The fastest way to get your results is to activate your My Chart account. Instructions are located on the last page of this paperwork. If you have not heard from [place] regarding the results in 2 weeks, please contact this office.     Deep Vein Thrombosis A deep vein thrombosis (DVT) is a blood clot (thrombus) that usually occurs in a deep, larger vein of the lower leg or the pelvis, or in an upper extremity such as the arm. These are dangerous and can lead to serious and even life-threatening complications if the clot travels to the lungs. A DVT can damage the valves in your leg veins so that instead of flowing upward, the blood pools in the lower leg. This is called post-thrombotic syndrome, and it can result in pain, swelling, discoloration, and sores on the leg. What are the causes? A DVT is caused by the formation of a blood clot in your leg, pelvis, or arm. Usually, several things contribute to the formation of blood clots. A clot may develop when:  Your blood flow slows down.  Your vein becomes damaged in some way.  You have a condition that makes your blood clot more easily.  What increases the risk? A DVT is more likely to develop in:  People who are older, especially  over 55 years of age.  People who are overweight (obese).  People who sit or lie still for a long time, such as during long-distance travel (over 4 hours), bed rest, hospitalization, or during recovery from certain medical conditions like a stroke.  People who do not engage in much physical activity (sedentary lifestyle).  People who have chronic breathing disorders.  People who have a personal or family history of blood clots or blood clotting disease.  People who have peripheral vascular disease (PVD), diabetes, or some types of cancer.  People who have heart disease, especially if the person had a recent heart attack or has congestive heart failure.  People who have neurological diseases that affect the legs (leg paresis).  People who have had a traumatic injury, such as breaking a hip or leg.  People who have recently had major or lengthy surgery, especially on the hip, knee, or abdomen.  People who have had a central line placed inside a large vein.  People who take medicines that contain the hormone estrogen. These include birth control pills and hormone replacement therapy.  Pregnancy or during childbirth or the postpartum period.  Long plane flights (over 8 hours).  What are the signs or symptoms?  Symptoms of a DVT can include:  Swelling of your leg or arm, especially if one side is much worse.  Warmth and redness of your leg or arm, especially if one side is much worse.  Pain in your arm or  leg. If the clot is in your leg, symptoms may be more noticeable or worse when you stand or walk.  A feeling of pins and needles, if the clot is in the arm.  The symptoms of a DVT that has traveled to the lungs (pulmonary embolism, PE) usually start suddenly and include:  Shortness of breath while active or at rest.  Coughing or coughing up blood or blood-tinged mucus.  Chest pain that is often worse with deep breaths.  Rapid or irregular heartbeat.  Feeling  light-headed or dizzy.  Fainting.  Feeling anxious.  Sweating.  There may also be pain and swelling in a leg if that is where the blood clot started. These symptoms may represent a serious problem that is an emergency. Do not wait to see if the symptoms will go away. Get medical help right away. Call your local emergency services (911 in the [place] Do not drive yourself to the hospital. How is this diagnosed? Your health care provider will take a medical history and perform a physical exam. You may also have other tests, including:  Blood tests to assess the clotting properties of your blood.  Imaging tests, such as CT, ultrasound, MRI, X-ray, and other tests to see if you have clots anywhere in your body.  How is this treated? After a DVT is identified, it can be treated. The type of treatment that you receive depends on many factors, such as the cause of your DVT, your risk for bleeding or developing more clots, and other medical conditions that you have. Sometimes, a combination of treatments is necessary. Treatment options may be combined and include:  Monitoring the blood clot with ultrasound.  Taking medicines by mouth, such as newer blood thinners (anticoagulants), thrombolytics, or warfarin.  Taking anticoagulant medicine by injection or through an IV tube.  Wearing compression stockings or using different types ofdevices.  Surgery (rare) to remove the blood clot or to place a filter in your abdomen to stop the blood clot from traveling to your lungs.  Treatments for a DVT are often divided into immediate treatment and long-term treatment (up to 3 months after DVT). You can work with your health care provider to choose the treatment program that is best for you. Follow these instructions at home: If you are taking a newer oral anticoagulant:  Take the medicine every single day at the same time each day.  Understand what foods and drugs interact with this  medicine.  Understand that there are no regular blood tests required when using this medicine.  Understand the side effects of this medicine, including excessive bruising or bleeding. Ask your health care provider or pharmacist about other possible side effects. If you are taking warfarin:  Understand how to take warfarin and know which foods can affect how warfarin works in Veterinary surgeon.  Understand that it is dangerous to take too much or too little warfarin. Too much warfarin increases the risk of bleeding. Too little warfarin continues to allow the risk for blood clots.  Follow your PT and INR blood testing schedule. The PT and INR results allow your health care provider to adjust your dose of warfarin. It is very important that you have your PT and INR tested as often as told by your health care provider.  Avoid major changes in your diet, or tell your health care provider before you change your diet. Arrange a visit with a registered dietitian to answer your questions. Many foods, especially foods that are high  in vitamin K, can interfere with warfarin and affect the PT and INR results. Eat a consistent amount of foods that are high in vitamin K, such as: ? Spinach, kale, broccoli, cabbage, collard greens, turnip greens, Brussels sprouts, peas, cauliflower, seaweed, and parsley. ? Beef liver and pork liver. ? Green tea. ? Soybean oil.  Tell your health care provider about any and all medicines, vitamins, and supplements that you take, including aspirin and other over-the-counter anti-inflammatory medicines. Be especially cautious with aspirin and anti-inflammatory medicines. Do not take those before you ask your health care provider if it is safe to do so. This is important because many medicines can interfere with warfarin and affect the PT and INR results.  Do not start or stop taking any over-the-counter or prescription medicine unless your health care provider or pharmacist tells you to  do so. If you take warfarin, you will also need to do these things:  Hold pressure over cuts for longer than usual.  Tell your dentist and other health care providers that you are taking warfarin before you have any procedures in which bleeding may occur.  Avoid alcohol or drink very small amounts. Tell your health care provider if you change your alcohol intake.  Do not use tobacco products, including cigarettes, chewing tobacco, and e-cigarettes. If you need help quitting, ask your health care provider.  Avoid contact sports.  General instructions  Take over-the-counter and prescription medicines only as told by your health care provider. Anticoagulant medicines can have side effects, including easy bruising and difficulty stopping bleeding. If you are prescribed an anticoagulant, you will also need to do these things: ? Hold pressure over cuts for longer than usual. ? Tell your dentist and other health care providers that you are taking anticoagulants before you have any procedures in which bleeding may occur. ? Avoid contact sports.  Wear a medical alert bracelet or carry a medical alert card that says you have had a PE.  Ask your health care provider how soon you can go back to your normal activities. Stay active to prevent new blood clots from forming.  Make sure to exercise while traveling or when you have been sitting or standing for a long period of time. It is very important to exercise. Exercise your legs by walking or by tightening and relaxing your leg muscles often. Take frequent walks.  Wear compression stockings as told by your health care provider to help prevent more blood clots from forming.  Do not use tobacco products, including cigarettes, chewing tobacco, and e-cigarettes. If you need help quitting, ask your health care provider.  Keep all follow-up appointments with your health care provider. This is important. How is this prevented? Take these actions to  decrease your risk of developing another DVT:  Exercise regularly. For at least 30 minutes every day, engage in: ? Activity that involves moving your arms and legs. ? Activity that encourages good blood flow through your body by increasing your heart rate.  Exercise your arms and legs every hour during long-distance travel (over 4 hours). Drink plenty of water and avoid drinking alcohol while traveling.  Avoid sitting or lying in bed for long periods of time without moving your legs.  Maintain a weight that is appropriate for your height. Ask your health care provider what weight is healthy for you.  If you are a woman who is over 18 years of age, avoid unnecessary use of medicines that contain estrogen. These include birth control pills.  Do not smoke, especially if you take estrogen medicines. If you need help quitting, ask your health care provider.  If you are hospitalized, prevention measures may include:  Early walking after surgery, as soon as your health care provider says that it is safe.  Receiving anticoagulants to prevent blood clots.If you cannot take anticoagulants, other options may be available, such as wearing compression stockings or using different types of devices.  Get help right away if:  You have new or increased pain, swelling, or redness in an arm or leg.  You have numbness or tingling in an arm or leg.  You have shortness of breath while active or at rest.  You have chest pain.  You have a rapid or irregular heartbeat.  You feel light-headed or dizzy.  You cough up blood.  You notice blood in your vomit, bowel movement, or urine. These symptoms may represent a serious problem that is an emergency. Do not wait to see if the symptoms will go away. Get medical help right away. Call your local emergency services (911 in the [place] Do not drive yourself to the hospital. This information is not intended to replace advice given to you by your health care  provider. Make sure you discuss any questions you have with your health care provider. Document Released: 02/24/2005 Document Revised: 08/02/2015 Document Reviewed: 06/21/2014 Elsevier Interactive Patient Education  2017 [employer].

## 2017-01-14 NOTE — Progress Notes (Signed)
Preliminary results by tech - Left Lower Ext. Venous Duplex Completed. Positive for subacute deep vein thrombosis involving the popliteal vein and posterior tibial veins. Results given to Jenny Reichmann at [facility] and patient was instructed to leave and wait for a phone call from her doctor.  Oda Cogan, BS, RDMS, RVT

## 2017-01-14 NOTE — Progress Notes (Signed)
Ariana Mcdaniel 41 y.o.   Chief Complaint  Patient presents with  . Joint Swelling    LEFT  per patient - because of torn calf muscle x 2 weeks ago    HISTORY OF PRESENT ILLNESS: This is a 41 y.o. female complaining of swelling to left lower leg x 2 days; s/p recent calf muscle tear; has been using ortho boot.  HPI   Prior to Admission medications   Medication Sig Start Date End Date Taking? Authorizing Provider  megestrol (MEGACE) 40 MG tablet Take 1 tablet (40 mg total) by mouth 2 (two) times daily. Patient not taking: Reported on 01/14/2017 11/17/16   Donnamae Jude, MD    Allergies  Allergen Reactions  . Amoxicillin     itching  . Clindamycin/Lincomycin     Caused a superinfection  . Oxycodone-Acetaminophen Hives    Patient Active Problem List   Diagnosis Date Noted  . Strain of calf muscle, initial encounter 01/01/2017  . Menorrhagia with regular cycle 11/17/2016  . Hordeolum of right eye 03/08/2014  . Allergic rhinitis due to pollen     Past Medical History:  Diagnosis Date  . Allergic rhinitis due to pollen   . Mastodynia     Past Surgical History:  Procedure Laterality Date  . TONSILLECTOMY    . WISDOM TOOTH EXTRACTION      Social History   Socioeconomic History  . Marital status: Married    Spouse name: Not on file  . Number of children: 2  . Years of education: Not on file  . Highest education level: Not on file  Social Needs  . Financial resource strain: Not on file  . Food insecurity - worry: Not on file  . Food insecurity - inability: Not on file  . Transportation needs - medical: Not on file  . Transportation needs - non-medical: Not on file  Occupational History  . Occupation: Engineer, civil (consulting): [employer]  Tobacco Use  . Smoking status: Former Smoker    Last attempt to quit: 01/04/2010    Years since quitting: 7.0  . Smokeless tobacco: Never Used  Substance and Sexual Activity  . Alcohol use: Yes    Alcohol/week:  1.2 oz    Types: 2 Cans of beer per week  . Drug use: No  . Sexual activity: Yes    Partners: Male    Birth control/protection: None    Comment: Vasectomy  Other Topics Concern  . Not on file  Social History Narrative   2nd marriage    Family History  Problem Relation Age of Onset  . Hypertension Maternal Grandmother   . Diabetes Maternal Grandmother   . Depression Paternal Grandmother   . Heart disease Neg Hx      Review of Systems  Constitutional: Negative.  Negative for chills and fever.  HENT: Negative.   Eyes: Negative.   Respiratory: Negative for cough, hemoptysis and shortness of breath.   Cardiovascular: Negative for chest pain and palpitations.  Gastrointestinal: Negative for abdominal pain, nausea and vomiting.  Skin: Negative.  Negative for rash.  Neurological: Negative.   Endo/Heme/Allergies: Negative.   All other systems reviewed and are negative.   Vitals:   01/14/17 1213  BP: 118/72  Pulse: 80  Resp: 16  Temp: 98.9 F (37.2 C)  SpO2: 96%    Physical Exam  Constitutional: She is oriented to person, place, and time. She appears well-developed and well-nourished.  HENT:  Head: Normocephalic.  Eyes:  Pupils are equal, round, and reactive to light.  Neck: Normal range of motion.  Cardiovascular: Normal rate.  Pulmonary/Chest: Effort normal.  Musculoskeletal:  LLE: mild distal swelling, no tenderness, NVI  Neurological: She is alert and oriented to person, place, and time.  Skin: Skin is warm and dry. Capillary refill takes less than 2 seconds. No rash noted.  Psychiatric: She has a normal mood and affect. Her behavior is normal.  Vitals reviewed.     Lab Results  Component Value Date   WBC 8.2 11/17/2016   HGB 13.3 11/17/2016   HCT 40.3 11/17/2016   MCV 84 11/17/2016   PLT 300 11/17/2016     ASSESSMENT & PLAN: Ariana Mcdaniel was seen today for joint swelling.  Diagnoses and all orders for this visit:  Left leg swelling Comments: r/o  DVT Orders: -     Cancel: US Venous Img Lower Unilateral Left; Future -     VAS [place] LOWER EXTREMITY VENOUS (DVT); Future  Acute deep vein thrombosis (DVT) of popliteal vein of left lower extremity (HCC) -     Rivaroxaban 15 & 20 MG TBPK; Take as directed: Start with one 15mg  tablet by mouth twice a day with food. On Day 22, switch to one 20mg  tablet once a day with food.    Patient Instructions   You are scheduled for a vascular ultrasound at 4pm at [facility]. Please arrive 15 minutes prior to your scheduled time at the admissions department located at the main entrance.     IF you received an x-ray today, you will receive an invoice from [facility]. Please contact [facility] at [phone] with questions or concerns regarding your invoice.   IF you received labwork today, you will receive an invoice from [place]. Please contact [facility] at [phone] with questions or concerns regarding your invoice.   Our billing staff will not be able to assist you with questions regarding bills from these companies.  You will be contacted with the lab results as soon as they are available. The fastest way to get your results is to activate your My Chart account. Instructions are located on the last page of this paperwork. If you have not heard from [place] regarding the results in 2 weeks, please contact this office.        Patient Instructions   You are scheduled for a vascular ultrasound at 4pm at [facility]. Please arrive 15 minutes prior to your scheduled time at the admissions department located at the main entrance.     IF you received an x-ray today, you will receive an invoice from [facility]. Please contact [facility] at [phone] with questions or concerns regarding your invoice.   IF you received labwork today, you will receive an invoice from [place]. Please contact [facility] at [phone] with questions or concerns  regarding your invoice.   Our billing staff will not be able to assist you with questions regarding bills from these companies.  You will be contacted with the lab results as soon as they are available. The fastest way to get your results is to activate your My Chart account. Instructions are located on the last page of this paperwork. If you have not heard from [place] regarding the results in 2 weeks, please contact this office.     Deep Vein Thrombosis A deep vein thrombosis (DVT) is a blood clot (thrombus) that usually occurs in a deep, larger vein of the lower leg or the pelvis, or in  an upper extremity such as the arm. These are dangerous and can lead to serious and even life-threatening complications if the clot travels to the lungs. A DVT can damage the valves in your leg veins so that instead of flowing upward, the blood pools in the lower leg. This is called post-thrombotic syndrome, and it can result in pain, swelling, discoloration, and sores on the leg. What are the causes? A DVT is caused by the formation of a blood clot in your leg, pelvis, or arm. Usually, several things contribute to the formation of blood clots. A clot may develop when:  Your blood flow slows down.  Your vein becomes damaged in some way.  You have a condition that makes your blood clot more easily.  What increases the risk? A DVT is more likely to develop in:  People who are older, especially over 43 years of age.  People who are overweight (obese).  People who sit or lie still for a long time, such as during long-distance travel (over 4 hours), bed rest, hospitalization, or during recovery from certain medical conditions like a stroke.  People who do not engage in much physical activity (sedentary lifestyle).  People who have chronic breathing disorders.  People who have a personal or family history of blood clots or blood clotting disease.  People who have peripheral vascular disease (PVD), diabetes,  or some types of cancer.  People who have heart disease, especially if the person had a recent heart attack or has congestive heart failure.  People who have neurological diseases that affect the legs (leg paresis).  People who have had a traumatic injury, such as breaking a hip or leg.  People who have recently had major or lengthy surgery, especially on the hip, knee, or abdomen.  People who have had a central line placed inside a large vein.  People who take medicines that contain the hormone estrogen. These include birth control pills and hormone replacement therapy.  Pregnancy or during childbirth or the postpartum period.  Long plane flights (over 8 hours).  What are the signs or symptoms?  Symptoms of a DVT can include:  Swelling of your leg or arm, especially if one side is much worse.  Warmth and redness of your leg or arm, especially if one side is much worse.  Pain in your arm or leg. If the clot is in your leg, symptoms may be more noticeable or worse when you stand or walk.  A feeling of pins and needles, if the clot is in the arm.  The symptoms of a DVT that has traveled to the lungs (pulmonary embolism, PE) usually start suddenly and include:  Shortness of breath while active or at rest.  Coughing or coughing up blood or blood-tinged mucus.  Chest pain that is often worse with deep breaths.  Rapid or irregular heartbeat.  Feeling light-headed or dizzy.  Fainting.  Feeling anxious.  Sweating.  There may also be pain and swelling in a leg if that is where the blood clot started. These symptoms may represent a serious problem that is an emergency. Do not wait to see if the symptoms will go away. Get medical help right away. Call your local emergency services (911 in the [place] Do not drive yourself to the hospital. How is this diagnosed? Your health care provider will take a medical history and perform a physical exam. You may also have other tests,  including:  Blood tests to assess the clotting properties of your  blood.  Imaging tests, such as CT, ultrasound, MRI, X-ray, and other tests to see if you have clots anywhere in your body.  How is this treated? After a DVT is identified, it can be treated. The type of treatment that you receive depends on many factors, such as the cause of your DVT, your risk for bleeding or developing more clots, and other medical conditions that you have. Sometimes, a combination of treatments is necessary. Treatment options may be combined and include:  Monitoring the blood clot with ultrasound.  Taking medicines by mouth, such as newer blood thinners (anticoagulants), thrombolytics, or warfarin.  Taking anticoagulant medicine by injection or through an IV tube.  Wearing compression stockings or using different types ofdevices.  Surgery (rare) to remove the blood clot or to place a filter in your abdomen to stop the blood clot from traveling to your lungs.  Treatments for a DVT are often divided into immediate treatment and long-term treatment (up to 3 months after DVT). You can work with your health care provider to choose the treatment program that is best for you. Follow these instructions at home: If you are taking a newer oral anticoagulant:  Take the medicine every single day at the same time each day.  Understand what foods and drugs interact with this medicine.  Understand that there are no regular blood tests required when using this medicine.  Understand the side effects of this medicine, including excessive bruising or bleeding. Ask your health care provider or pharmacist about other possible side effects. If you are taking warfarin:  Understand how to take warfarin and know which foods can affect how warfarin works in Veterinary surgeon.  Understand that it is dangerous to take too much or too little warfarin. Too much warfarin increases the risk of bleeding. Too little warfarin continues to  allow the risk for blood clots.  Follow your PT and INR blood testing schedule. The PT and INR results allow your health care provider to adjust your dose of warfarin. It is very important that you have your PT and INR tested as often as told by your health care provider.  Avoid major changes in your diet, or tell your health care provider before you change your diet. Arrange a visit with a registered dietitian to answer your questions. Many foods, especially foods that are high in vitamin K, can interfere with warfarin and affect the PT and INR results. Eat a consistent amount of foods that are high in vitamin K, such as: ? Spinach, kale, broccoli, cabbage, collard greens, turnip greens, Brussels sprouts, peas, cauliflower, seaweed, and parsley. ? Beef liver and pork liver. ? Green tea. ? Soybean oil.  Tell your health care provider about any and all medicines, vitamins, and supplements that you take, including aspirin and other over-the-counter anti-inflammatory medicines. Be especially cautious with aspirin and anti-inflammatory medicines. Do not take those before you ask your health care provider if it is safe to do so. This is important because many medicines can interfere with warfarin and affect the PT and INR results.  Do not start or stop taking any over-the-counter or prescription medicine unless your health care provider or pharmacist tells you to do so. If you take warfarin, you will also need to do these things:  Hold pressure over cuts for longer than usual.  Tell your dentist and other health care providers that you are taking warfarin before you have any procedures in which bleeding may occur.  Avoid alcohol or drink  very small amounts. Tell your health care provider if you change your alcohol intake.  Do not use tobacco products, including cigarettes, chewing tobacco, and e-cigarettes. If you need help quitting, ask your health care provider.  Avoid contact sports.  General  instructions  Take over-the-counter and prescription medicines only as told by your health care provider. Anticoagulant medicines can have side effects, including easy bruising and difficulty stopping bleeding. If you are prescribed an anticoagulant, you will also need to do these things: ? Hold pressure over cuts for longer than usual. ? Tell your dentist and other health care providers that you are taking anticoagulants before you have any procedures in which bleeding may occur. ? Avoid contact sports.  Wear a medical alert bracelet or carry a medical alert card that says you have had a PE.  Ask your health care provider how soon you can go back to your normal activities. Stay active to prevent new blood clots from forming.  Make sure to exercise while traveling or when you have been sitting or standing for a long period of time. It is very important to exercise. Exercise your legs by walking or by tightening and relaxing your leg muscles often. Take frequent walks.  Wear compression stockings as told by your health care provider to help prevent more blood clots from forming.  Do not use tobacco products, including cigarettes, chewing tobacco, and e-cigarettes. If you need help quitting, ask your health care provider.  Keep all follow-up appointments with your health care provider. This is important. How is this prevented? Take these actions to decrease your risk of developing another DVT:  Exercise regularly. For at least 30 minutes every day, engage in: ? Activity that involves moving your arms and legs. ? Activity that encourages good blood flow through your body by increasing your heart rate.  Exercise your arms and legs every hour during long-distance travel (over 4 hours). Drink plenty of water and avoid drinking alcohol while traveling.  Avoid sitting or lying in bed for long periods of time without moving your legs.  Maintain a weight that is appropriate for your height. Ask  your health care provider what weight is healthy for you.  If you are a woman who is over 16 years of age, avoid unnecessary use of medicines that contain estrogen. These include birth control pills.  Do not smoke, especially if you take estrogen medicines. If you need help quitting, ask your health care provider.  If you are hospitalized, prevention measures may include:  Early walking after surgery, as soon as your health care provider says that it is safe.  Receiving anticoagulants to prevent blood clots.If you cannot take anticoagulants, other options may be available, such as wearing compression stockings or using different types of devices.  Get help right away if:  You have new or increased pain, swelling, or redness in an arm or leg.  You have numbness or tingling in an arm or leg.  You have shortness of breath while active or at rest.  You have chest pain.  You have a rapid or irregular heartbeat.  You feel light-headed or dizzy.  You cough up blood.  You notice blood in your vomit, bowel movement, or urine. These symptoms may represent a serious problem that is an emergency. Do not wait to see if the symptoms will go away. Get medical help right away. Call your local emergency services (911 in the [place] Do not drive yourself to the hospital. This information is  not intended to replace advice given to you by your health care provider. Make sure you discuss any questions you have with your health care provider. Document Released: 02/24/2005 Document Revised: 08/02/2015 Document Reviewed: 06/21/2014 Elsevier Interactive Patient Education  2017 [employer]       Agustina Caroli, MD [facility]

## 2017-01-16 ENCOUNTER — Encounter (HOSPITAL_COMMUNITY): Payer: Self-pay

## 2017-01-16 ENCOUNTER — Ambulatory Visit: Payer: BC Managed Care – PPO | Admitting: Internal Medicine

## 2017-01-28 ENCOUNTER — Encounter: Payer: Self-pay | Admitting: Family Medicine

## 2017-01-28 ENCOUNTER — Ambulatory Visit: Payer: BC Managed Care – PPO | Admitting: Family Medicine

## 2017-01-28 VITALS — BP 128/78 | HR 90 | Temp 98.5°F | Resp 12 | Wt 235.0 lb

## 2017-01-28 DIAGNOSIS — R509 Fever, unspecified: Secondary | ICD-10-CM

## 2017-01-28 DIAGNOSIS — J069 Acute upper respiratory infection, unspecified: Secondary | ICD-10-CM | POA: Diagnosis not present

## 2017-01-28 LAB — POCT INFLUENZA A/B
Influenza A, POC: NEGATIVE
Influenza B, POC: NEGATIVE

## 2017-01-28 MED ORDER — BENZONATATE 100 MG PO CAPS: 200.0000 mg | ORAL_CAPSULE | Freq: Two times a day (BID) | ORAL | 0 refills | Status: DC | PRN

## 2017-01-28 NOTE — Patient Instructions (Signed)
  Ms.Anastazja L Hillmer I have seen you today for an acute visit.  A few things to remember from today's visit:   Fever, unspecified  URI, acute   Medications prescribed today are intended for short period of time and will not be refill upon request, a follow up appointment might be necessary to discuss continuation of of treatment if appropriate.   viral infections are self-limited and we treat each symptom depending of severity.  Over the counter medications as decongestants and cold medications usually help, they need to be taken with caution if there is a history of high blood pressure or palpitations. Plain Mucinex.  Tylenol also helps with most symptoms (headache, muscle aching, fever,etc) Plenty of fluids. Honey helps with cough. Steam inhalations helps with runny nose, nasal congestion, and may prevent sinus infections. Cough and nasal congestion could last a few days and sometimes weeks.    In general please monitor for signs of worsening symptoms and seek immediate medical attention if any concerning.  If symptoms are not resolved in 1-2 vweeks you should schedule a follow up appointment with your doctor, before if needed.  I hope you get better soon!

## 2017-01-28 NOTE — Progress Notes (Signed)
ACUTE VISIT  HPI:  Chief Complaint  Patient presents with  . URI  . Cough    Ms.Lanell L Stumpp is a 41 y.o.female here today complaining of 4 days of respiratory symptoms.  2 days of fever, yesterday 101.4 F  URI   This is a new problem. The current episode started in the past 7 days. Associated symptoms include congestion, coughing, ear pain, headaches, rhinorrhea and a sore throat. Pertinent negatives include no abdominal pain, chest pain, diarrhea, joint swelling, nausea, neck pain, rash, sinus pain, vomiting or wheezing.    "Non very" productive  Cough. Sore throat and chest soreness upon coughing.   No Hx of recent travel. + Sick contact: Husband  Hx of allergies: Hx of allergic rhinitis.  OTC medications for this problem: Cetirizine. She has not tried other medications because she is afraid of interactions with Xarelto.  Symptoms otherwise stable.   Review of Systems  Constitutional: Positive for appetite change, chills, fatigue and fever. Negative for activity change.  HENT: Positive for congestion, ear pain, postnasal drip, rhinorrhea and sore throat. Negative for mouth sores, sinus pressure, sinus pain, trouble swallowing and voice change.   Eyes: Negative for discharge, redness and itching.  Respiratory: Positive for cough. Negative for shortness of breath and wheezing.   Cardiovascular: Negative for chest pain and leg swelling.  Gastrointestinal: Negative for abdominal pain, diarrhea, nausea and vomiting.  Musculoskeletal: Positive for myalgias. Negative for joint swelling and neck pain.  Skin: Negative for rash.  Allergic/Immunologic: Positive for environmental allergies.  Neurological: Positive for headaches. Negative for syncope and weakness.  Hematological: Negative for adenopathy. Does not bruise/bleed easily.  Psychiatric/Behavioral: Negative for confusion. The patient is nervous/anxious.       Current Outpatient Medications on File Prior  to Visit  Medication Sig Dispense Refill  . megestrol (MEGACE) 40 MG tablet Take 1 tablet (40 mg total) by mouth 2 (two) times daily. 30 tablet 3  . Rivaroxaban 15 & 20 MG TBPK Take as directed: Start with one 15mg  tablet by mouth twice a day with food. On Day 22, switch to one 20mg  tablet once a day with food. 51 each 0   No current facility-administered medications on file prior to visit.      Past Medical History:  Diagnosis Date  . Allergic rhinitis due to pollen   . Mastodynia    Allergies  Allergen Reactions  . Amoxicillin     itching  . Clindamycin/Lincomycin     Caused a superinfection  . Oxycodone-Acetaminophen Hives    Social History   Socioeconomic History  . Marital status: Married    Spouse name: None  . Number of children: 2  . Years of education: None  . Highest education level: None  Social Needs  . Financial resource strain: None  . Food insecurity - worry: None  . Food insecurity - inability: None  . Transportation needs - medical: None  . Transportation needs - non-medical: None  Occupational History  . Occupation: Engineer, civil (consulting): [employer]  Tobacco Use  . Smoking status: Former Smoker    Last attempt to quit: 01/04/2010    Years since quitting: 7.0  . Smokeless tobacco: Never Used  Substance and Sexual Activity  . Alcohol use: Yes    Alcohol/week: 1.2 oz    Types: 2 Cans of beer per week  . Drug use: No  . Sexual activity: Yes    Partners: Male  Birth control/protection: None    Comment: Vasectomy  Other Topics Concern  . None  Social History Narrative   2nd marriage    Vitals:   01/28/17 1349  BP: 128/78  Pulse: 90  Resp: 12  Temp: 98.5 F (36.9 C)  SpO2: 98%   Body mass index is 42.3 kg/m.   Physical Exam  Constitutional: She is oriented to person, place, and time. She appears well-developed. She does not appear ill. No distress.  HENT:  Head: Normocephalic and atraumatic.  Right Ear: External ear  and ear canal normal. Tympanic membrane is not erythematous and not bulging. A middle ear effusion is present.  Left Ear: External ear and ear canal normal. Tympanic membrane is not erythematous and not bulging. A middle ear effusion is present.  Nose: Rhinorrhea present. Right sinus exhibits no maxillary sinus tenderness and no frontal sinus tenderness. Left sinus exhibits no maxillary sinus tenderness and no frontal sinus tenderness.  Mouth/Throat: Oropharynx is clear and moist and mucous membranes are normal.  Eyes: Conjunctivae are normal.  Neck: No muscular tenderness present. No edema and no erythema present.  Cardiovascular: Normal rate and regular rhythm.  No murmur heard. Respiratory: Effort normal and breath sounds normal. No stridor. No respiratory distress.  Lymphadenopathy:       Head (right side): No submandibular adenopathy present.       Head (left side): No submandibular adenopathy present.    She has no cervical adenopathy.  Neurological: She is alert and oriented to person, place, and time. She has normal strength. Gait normal.  Skin: Skin is warm. No rash noted. No erythema.  Psychiatric: Her mood appears anxious.  Well groomed, good eye contact.    ASSESSMENT AND PLAN:   Ms. Christiona was seen today for uri and cough.  Diagnoses and all orders for this visit:  Fever, unspecified  Rapid flu test done and negative. Tylenol 500 mg tid as needed,adequate hydration,and rest.  -     POCT Influenza A/B  URI, acute  Symptoms suggests a viral etiology, I explained that symptomatic treatment is usually recommended in this case, so I do not think abx is needed at this time. Plain Mucinex may help.  Instructed to monitor for signs of complications,clearly instructed about warning signs. I also explained that cough and nasal congestion can last a few days and sometimes weeks. F/U as needed.   -     benzonatate (TESSALON) 100 MG capsule; Take 2 capsules (200 mg total)  by mouth 2 (two) times daily as needed for up to 10 days for cough. -     POCT Influenza A/B     -Ms. SERITA DEGROOTE was advised to seek attention immediately if symptoms worsen or to follow if they persist or new concerns arise.       Catalena Stanhope G. Martinique, MD  [facility]. [place] office.

## 2017-01-30 ENCOUNTER — Ambulatory Visit: Payer: BC Managed Care – PPO | Admitting: Emergency Medicine

## 2017-02-06 ENCOUNTER — Encounter: Payer: Self-pay | Admitting: Internal Medicine

## 2017-02-06 ENCOUNTER — Ambulatory Visit: Payer: BC Managed Care – PPO | Admitting: Internal Medicine

## 2017-02-06 VITALS — BP 128/72 | HR 78 | Temp 98.0°F | Wt 239.0 lb

## 2017-02-06 DIAGNOSIS — I82409 Acute embolism and thrombosis of unspecified deep veins of unspecified lower extremity: Secondary | ICD-10-CM | POA: Insufficient documentation

## 2017-02-06 DIAGNOSIS — I82432 Acute embolism and thrombosis of left popliteal vein: Secondary | ICD-10-CM | POA: Diagnosis not present

## 2017-02-06 MED ORDER — RIVAROXABAN 20 MG PO TABS: 20.0000 mg | ORAL_TABLET | Freq: Every day | ORAL | 1 refills | Status: DC

## 2017-02-06 NOTE — Assessment & Plan Note (Signed)
Symptoms were mild but now resolved No history of prior DVT and this was provoked by immobility Discussed--- care with long trips, no hormonal Rx in future, etc Will continue xarelto to 3 months Rx--and then should be able to stop Will see her back before then

## 2017-02-06 NOTE — Progress Notes (Signed)
   Subjective:    Patient ID: Ariana Mcdaniel, female    DOB: 1975/12/08, 41 y.o.   MRN: [id number]  HPI Here for follow up of DVT  Tore calf muscle Was put in boot for a couple of weeks----then able to get out of boot Calf swelling went down--but then foot and ankle swelled Thought she might have a DVT--so went to urgent care  DVT found on ultrasound Noticed improvement in swelling about 2 days after starting xarelto  Just changed to 20mg  daily today  Current Outpatient Medications on File Prior to Visit  Medication Sig Dispense Refill  . Rivaroxaban 15 & 20 MG TBPK Take as directed: Start with one 15mg  tablet by mouth twice a day with food. On Day 22, switch to one 20mg  tablet once a day with food. 51 each 0   No current facility-administered medications on file prior to visit.     Allergies  Allergen Reactions  . Amoxicillin     itching  . Clindamycin/Lincomycin     Caused a superinfection  . Oxycodone-Acetaminophen Hives    Past Medical History:  Diagnosis Date  . Allergic rhinitis due to pollen   . Mastodynia     Past Surgical History:  Procedure Laterality Date  . TONSILLECTOMY    . WISDOM TOOTH EXTRACTION      Family History  Problem Relation Age of Onset  . Hypertension Maternal Grandmother   . Diabetes Maternal Grandmother   . Depression Paternal Grandmother   . Heart disease Neg Hx     Social History   Socioeconomic History  . Marital status: Married    Spouse name: Not on file  . Number of children: 2  . Years of education: Not on file  . Highest education level: Not on file  Social Needs  . Financial resource strain: Not on file  . Food insecurity - worry: Not on file  . Food insecurity - inability: Not on file  . Transportation needs - medical: Not on file  . Transportation needs - non-medical: Not on file  Occupational History  . Occupation: Engineer, civil (consulting): [employer]  Tobacco Use  . Smoking status: Former Smoker   Last attempt to quit: 01/04/2010    Years since quitting: 7.0  . Smokeless tobacco: Never Used  Substance and Sexual Activity  . Alcohol use: Yes    Alcohol/week: 1.2 oz    Types: 2 Cans of beer per week  . Drug use: No  . Sexual activity: Yes    Partners: Male    Birth control/protection: None    Comment: Vasectomy  Other Topics Concern  . Not on file  Social History Narrative   2nd marriage   Review of Systems Periods regular No hormonal birth control (vasectomy for husband) No chest pain No SOB    Objective:   Physical Exam  Musculoskeletal:  No calf swelling, tenderness homan's negative No edema in feet or ankles          Assessment & Plan:

## 2017-04-01 ENCOUNTER — Ambulatory Visit (HOSPITAL_BASED_OUTPATIENT_CLINIC_OR_DEPARTMENT_OTHER): Admission: RE | Admit: 2017-04-01 | Payer: BC Managed Care – PPO | Source: Ambulatory Visit | Admitting: Family Medicine

## 2017-04-01 ENCOUNTER — Encounter (HOSPITAL_BASED_OUTPATIENT_CLINIC_OR_DEPARTMENT_OTHER): Admission: RE | Payer: Self-pay | Source: Ambulatory Visit

## 2017-04-01 SURGERY — DILATATION & CURETTAGE/HYSTEROSCOPY WITH HYDROTHERMAL ABLATION
Anesthesia: Choice

## 2017-04-03 ENCOUNTER — Encounter: Payer: Self-pay | Admitting: Internal Medicine

## 2017-04-03 ENCOUNTER — Ambulatory Visit: Payer: BC Managed Care – PPO | Admitting: Internal Medicine

## 2017-04-03 VITALS — BP 114/80 | HR 96 | Temp 98.7°F | Wt 239.0 lb

## 2017-04-03 DIAGNOSIS — I82432 Acute embolism and thrombosis of left popliteal vein: Secondary | ICD-10-CM | POA: Diagnosis not present

## 2017-04-03 NOTE — Assessment & Plan Note (Signed)
Provoked DVT ---- knee and below Did well with Rx No worrisome history for her (though younger sister had unprovoked arm DVT) Probably can stop after 3 months---but will set up with hematologist to confirm

## 2017-04-03 NOTE — Progress Notes (Signed)
   Subjective:    Patient ID: Ariana Mcdaniel, female    DOB: 29-Dec-1975, 42 y.o.   MRN: [id number]  HPI Here for follow up of DVT Calf injury mostly healed No restrictions in mobility Swelling from the DVT resolved quickly No leg pain  No prior DVT No miscarriage OCP for ~15 years with no problems Her younger sister had DVT in arm some years ago (not sure if she was tested for hypercoaguable state)  Current Outpatient Medications on File Prior to Visit  Medication Sig Dispense Refill  . rivaroxaban (XARELTO) 20 MG TABS tablet Take 1 tablet (20 mg total) by mouth daily with supper. 30 tablet 1   No current facility-administered medications on file prior to visit.     Allergies  Allergen Reactions  . Amoxicillin     itching  . Clindamycin/Lincomycin     Caused a superinfection  . Oxycodone-Acetaminophen Hives    Past Medical History:  Diagnosis Date  . Allergic rhinitis due to pollen   . DVT (deep venous thrombosis) ([place]) 01/2017  . Mastodynia     Past Surgical History:  Procedure Laterality Date  . TONSILLECTOMY    . WISDOM TOOTH EXTRACTION      Family History  Problem Relation Age of Onset  . Hypertension Maternal Grandmother   . Diabetes Maternal Grandmother   . Depression Paternal Grandmother   . Heart disease Neg Hx     Social History   Socioeconomic History  . Marital status: Married    Spouse name: Not on file  . Number of children: 2  . Years of education: Not on file  . Highest education level: Not on file  Social Needs  . Financial resource strain: Not on file  . Food insecurity - worry: Not on file  . Food insecurity - inability: Not on file  . Transportation needs - medical: Not on file  . Transportation needs - non-medical: Not on file  Occupational History  . 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 Smoking status: Former Smoker    Last attempt to quit: 01/04/2010    Years since quitting: 7.2  .  Smokeless tobacco: Never Used  Substance and Sexual Activity  . Alcohol use: Yes    Alcohol/week: 1.2 oz    Types: 2 Cans of beer per week  . Drug use: No  . Sexual activity: Yes    Partners: Male    Birth control/protection: None    Comment: Vasectomy  Other Topics Concern  . Not on file  Social History Narrative   2nd marriage   Review of Systems No chest pain No SOB No excessive bleeding on medication--- except for period    Objective:   Physical Exam  Musculoskeletal: She exhibits no edema or tenderness.  No calf swelling or tenderness          Assessment & Plan:

## 2017-04-13 ENCOUNTER — Telehealth: Payer: Self-pay | Admitting: Hematology and Oncology

## 2017-04-13 ENCOUNTER — Encounter: Payer: Self-pay | Admitting: Hematology and Oncology

## 2017-04-13 NOTE — Telephone Encounter (Signed)
Pt has been scheduled to see Dr. Lindi Adie on 3/12 at 345pm. Pt requested to be scheduled in March due to her work schedule. Address verified. Letter mailed.

## 2017-04-15 ENCOUNTER — Encounter: Payer: Self-pay | Admitting: Family Medicine

## 2017-04-15 ENCOUNTER — Ambulatory Visit: Payer: BC Managed Care – PPO | Admitting: Family Medicine

## 2017-04-15 ENCOUNTER — Ambulatory Visit: Payer: Self-pay | Admitting: *Deleted

## 2017-04-15 VITALS — BP 124/77 | HR 70 | Temp 98.7°F | Resp 12 | Ht 62.5 in | Wt 240.1 lb

## 2017-04-15 DIAGNOSIS — R600 Localized edema: Secondary | ICD-10-CM | POA: Diagnosis not present

## 2017-04-15 DIAGNOSIS — I82432 Acute embolism and thrombosis of left popliteal vein: Secondary | ICD-10-CM | POA: Diagnosis not present

## 2017-04-15 MED ORDER — FUROSEMIDE 20 MG PO TABS: 20.0000 mg | ORAL_TABLET | Freq: Every day | ORAL | 0 refills | Status: DC

## 2017-04-15 NOTE — Patient Instructions (Addendum)
  [name] I have seen you today for an acute visit.  A few things to remember from today's visit:   Bilateral lower extremity edema - Plan: Basic metabolic panel, POCT Urinalysis Dipstick (Automated), furosemide (LASIX) 20 MG tablet  Deep vein thrombosis (DVT) of popliteal vein of left lower extremity, unspecified chronicity (HCC)  Clinically and history do not suggest DVT. ? Vein disease.  Probability is never zero,so monitor for worrisome signs as we discussed.    Compression stocking may help. Furosemide for 7-14 days may help. Potassium rich diet.    In general please monitor for signs of worsening symptoms and seek immediate medical attention if any concerning.  If symptoms are not resolved in a few days/weeks you should schedule a follow up appointment with your doctor, before if needed.  I hope you get better soon!

## 2017-04-15 NOTE — Telephone Encounter (Signed)
I spoke with pt and swelling in both lower legs started 04/12/17; hx of dvt. Pt scheduled appt with Dr Martinique today at 12 noon.

## 2017-04-15 NOTE — Telephone Encounter (Signed)
Pt called because of swelling in both legs from her feet to her knee. She states some tenderness in the left leg, hx of a blood clot in that leg. There is not redness. Taking xarelto.  Appointment made for tomorrow at [facility]. Home care advice given with verbal understanding.  Reason for Disposition . [1] MODERATE leg swelling (e.g., swelling extends up to knees) AND [2] new onset or worsening  Answer Assessment - Initial Assessment Questions 1. ONSET: "When did the swelling start?" (e.g., minutes, hours, days)     yesterday 2. LOCATION: "What part of the leg is swollen?"  "Are both legs swollen or just one leg?"    From the keen to foot and very tight 3. SEVERITY: "How bad is the swelling?" (e.g., localized; mild, moderate, severe)  - Localized - small area of swelling localized to one leg  - MILD pedal edema - swelling limited to foot and ankle, pitting edema < 1/4 inch (6 mm) deep, rest and elevation eliminate most or all swelling  - MODERATE edema - swelling of lower leg to knee, pitting edema > 1/4 inch (6 mm) deep, rest and elevation only partially reduce swelling  - SEVERE edema - swelling extends above knee, facial or hand swelling present      moderate 4. REDNESS: "Does the swelling look red or infected?"     no 5. PAIN: "Is the swelling painful to touch?" If so, ask: "How painful is it?"   (Scale 1-10; mild, moderate or severe)     Left leg #2 6. FEVER: "Do you have a fever?" If so, ask: "What is it, how was it measured, and when did it start?"      no 7. CAUSE: "What do you think is causing the leg swelling?"     Not sure 8. MEDICAL HISTORY: "Do you have a history of heart failure, kidney disease, liver failure, or cancer?"     no 9. RECURRENT SYMPTOM: "Have you had leg swelling before?" If so, ask: "When was the last time?" "What happened that time?"     Yes in only one about 3 months ago 10. OTHER SYMPTOMS: "Do you have any other symptoms?" (e.g., chest pain,  difficulty breathing)       no 11. PREGNANCY: "Is there any chance you are pregnant?" "When was your last menstrual period?"       No, LMP will start in a day or so  Protocols used: LEG SWELLING AND EDEMA-A-AH

## 2017-04-15 NOTE — Progress Notes (Signed)
ACUTE VISIT   HPI:  Chief Complaint  Patient presents with  . Leg Swelling    lower legs and feet swelling, started Sunday, hx of DVT    [name] is a 42 y.o. female, who is here today complaining of 3 days of constant bilateral LE edema.  She has history of DVT, she has 3 days left of Xarelto to complete 3 months of treatment. She denies leg erythema or pain.  She has not identified exacerbating or alleviating factors, edema is still present in the morning when she first gets up. She denies chest pain, palpitations, dyspnea, or diaphoresis. She is not on OTC herbs treatment or chronic use of NSAIDs. No history of hypertension.  She just returned from [place] 3 days ago, she was on vacation.  According to patient she did walk "a lot", about 5.7 miles per day, she does stayed there for 3 days.  She did fly for an hour, she is not on hormonal therapy and no history of trauma.  She has not try OTC medications.    Review of Systems  Constitutional: Negative for activity change, appetite change, fatigue and fever.  HENT: Negative for mouth sores, nosebleeds, sore throat and trouble swallowing.   Respiratory: Negative for cough, shortness of breath and wheezing.   Cardiovascular: Positive for leg swelling. Negative for chest pain and palpitations.  Gastrointestinal: Negative for abdominal pain, nausea and vomiting.       Negative for changes in bowel habits.  Genitourinary: Negative for decreased urine volume, dysuria and hematuria.  Musculoskeletal: Negative for back pain, gait problem and myalgias.  Skin: Negative for rash and wound.  Neurological: Negative for syncope, weakness, numbness and headaches.  Hematological: Negative for adenopathy. Does not bruise/bleed easily.  Psychiatric/Behavioral: Negative for confusion. The patient is nervous/anxious.       Current Outpatient Medications on File Prior to Visit  Medication Sig Dispense Refill  .  rivaroxaban (XARELTO) 20 MG TABS tablet Take 1 tablet (20 mg total) by mouth daily with supper. 30 tablet 1   No current facility-administered medications on file prior to visit.      Past Medical History:  Diagnosis Date  . Allergic rhinitis due to pollen   . DVT (deep venous thrombosis) ([place]) 01/2017  . Mastodynia    Allergies  Allergen Reactions  . Amoxicillin     itching  . Clindamycin/Lincomycin     Caused a superinfection  . Oxycodone-Acetaminophen Hives    Social History   Socioeconomic History  . Marital status: Married    Spouse name: None  . Number of children: 2  . Years of education: None  . Highest education level: None  Social Needs  . Financial resource strain: None  . Food insecurity - worry: None  . Food insecurity - inability: None  . Transportation needs - medical: None  . Transportation needs - non-medical: None  Occupational History  . 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 Smoking status: Former Smoker    Last attempt to quit: 01/04/2010    Years since quitting: 7.2  . Smokeless tobacco: Never Used  Substance and Sexual Activity  . Alcohol use: Yes    Alcohol/week: 1.2 oz    Types: 2 Cans of beer per week  . Drug use: No  . Sexual activity: Yes    Partners: Male    Birth control/protection: None    Comment: Vasectomy  Other Topics Concern  . None  Social History Narrative   2nd marriage    Vitals:   04/15/17 1147  BP: 124/77  Pulse: 70  Resp: 12  Temp: 98.7 F (37.1 C)  SpO2: 98%   Body mass index is 43.22 kg/m.    Physical Exam  Nursing note and vitals reviewed. Constitutional: She is oriented to person, place, and time. She appears well-developed. No distress.  HENT:  Head: Normocephalic and atraumatic.  Mouth/Throat: Oropharynx is clear and moist and mucous membranes are normal.  Eyes: Conjunctivae are normal. Pupils are equal, round, and reactive to light.  Neck: No JVD present.    Cardiovascular: Normal rate and regular rhythm.  No murmur heard. Pulses:      Dorsalis pedis pulses are 2+ on the right side, and 2+ on the left side.  Homan's sign negative bilaterally  Respiratory: Effort normal and breath sounds normal. No respiratory distress.  GI: Soft. She exhibits no mass. There is no hepatomegaly. There is no tenderness.  Musculoskeletal: She exhibits edema (1+ pitting LE edema,bilateral.). She exhibits no tenderness.       Right lower leg: She exhibits no tenderness.       Left lower leg: She exhibits no tenderness.  Lymphadenopathy:    She has no cervical adenopathy.  Neurological: She is alert and oriented to person, place, and time. She has normal strength. Gait normal.  Skin: Skin is warm. No rash noted. No erythema.  Psychiatric: Her mood appears anxious.  Well groomed, good eye contact.    ASSESSMENT AND PLAN:   Ms. Ariana Mcdaniel was seen today for leg swelling.  Diagnoses and all orders for this visit:  Bilateral lower extremity edema  We discussed possible etiologies. This problem could be related to prolonged walking during the time she was seen [facility]. The probability of these being a bilateral DVT is very low but never 0, she agrees with holding on further workup (d-dimer and LE [place]). Furosemide 20 mg daily for 7 days might help, some side effects discussed. Also recommend compression stockings and/or LE elevation. Further recommendation will be given according to lab results.  -     Basic metabolic panel -     POCT Urinalysis Dipstick (Automated) -     furosemide (LASIX) 20 MG tablet; Take 1 tablet (20 mg total) by mouth daily for 14 days.  Deep vein thrombosis (DVT) of popliteal vein of left lower extremity, unspecified chronicity (HCC)  We discussed risk factors for DVT and clinical presentation. Wells score: Low probability. She was instructed to complete treatment with Xarelto. She will monitor for warning signs.    -[name] was advised to seek immediate medical attention if sudden worsening symptoms or to follow if they persist or if new concerns arise.  She voices understanding and agrees with plan.       Betty G. Martinique, MD  [facility]. [place] office.

## 2017-04-16 ENCOUNTER — Ambulatory Visit: Payer: BC Managed Care – PPO | Admitting: Family Medicine

## 2017-04-17 ENCOUNTER — Ambulatory Visit: Payer: BC Managed Care – PPO | Admitting: Family Medicine

## 2017-05-19 ENCOUNTER — Inpatient Hospital Stay: Payer: BC Managed Care – PPO | Attending: Hematology and Oncology | Admitting: Hematology and Oncology

## 2017-05-19 ENCOUNTER — Inpatient Hospital Stay: Payer: BC Managed Care – PPO

## 2017-05-19 DIAGNOSIS — F17210 Nicotine dependence, cigarettes, uncomplicated: Secondary | ICD-10-CM | POA: Diagnosis not present

## 2017-05-19 DIAGNOSIS — D6851 Activated protein C resistance: Secondary | ICD-10-CM | POA: Diagnosis not present

## 2017-05-19 DIAGNOSIS — I82432 Acute embolism and thrombosis of left popliteal vein: Secondary | ICD-10-CM | POA: Diagnosis not present

## 2017-05-19 LAB — D-DIMER, QUANTITATIVE: D-Dimer, Quant: 0.43 ug/mL-FEU (ref 0.00–0.50)

## 2017-05-19 LAB — ANTITHROMBIN III: AntiThromb III Func: 103 % (ref 75–120)

## 2017-05-19 NOTE — Addendum Note (Signed)
Addended by: Thelma Barge MAY J on: 05/19/2017 04:42 PM   Modules accepted: Orders

## 2017-05-19 NOTE — Progress Notes (Signed)
[facility] CONSULT NOTE  Patient Care Team: Venia Carbon, MD as PCP - General (Pediatrics)  CHIEF COMPLAINTS/PURPOSE OF CONSULTATION:  Left leg DVT November 2018  HISTORY OF PRESENTING ILLNESS:  Ariana Mcdaniel 42 y.o. female is here because of prior history of left leg DVT in November 2019.  Patient had a left leg injury and was in a cast for several weeks and after the cast was removed the leg continued to remain swollen.  This was when she underwent an ultrasound which revealed left popliteal vein and posterior tibial vein DVTs.  She was placed on anticoagulation for 3 months.  She did not have any side effects.  Leg swelling cleared out and the treatment was discontinued.  She was sent to [place] for discussion regarding risk factors for DVT.  This is primarily because her sister was diagnosed with DVT 5 months ago.  There is no other family history of DVT.  I reviewed her records extensively and collaborated the history with the patient.  MEDICAL HISTORY:  Past Medical History:  Diagnosis Date  . Allergic rhinitis due to pollen   . DVT (deep venous thrombosis) ([place]) 01/2017  . Mastodynia     SURGICAL HISTORY: Past Surgical History:  Procedure Laterality Date  . TONSILLECTOMY    . WISDOM TOOTH EXTRACTION      SOCIAL HISTORY: Social History   Socioeconomic History  . Marital status: Married    Spouse name: Not on file  . Number of children: 2  . Years of education: Not on file  . Highest education level: Not on file  Social Needs  . Financial resource strain: Not on file  . Food insecurity - worry: Not on file  . Food insecurity - inability: Not on file  . Transportation needs - medical: Not on file  . Transportation needs - non-medical: Not on file  Occupational History  . Occupation: Engineer, civil (consulting): [employer]  Tobacco Use  . Smoking status: Former Smoker    Last attempt to quit: 01/04/2010    Years since quitting: 7.3  .  Smokeless tobacco: Never Used  Substance and Sexual Activity  . Alcohol use: Yes    Alcohol/week: 1.2 oz    Types: 2 Cans of beer per week  . Drug use: No  . Sexual activity: Yes    Partners: Male    Birth control/protection: None    Comment: Vasectomy  Other Topics Concern  . Not on file  Social History Narrative   2nd marriage    FAMILY HISTORY: Family History  Problem Relation Age of Onset  . Hypertension Maternal Grandmother   . Diabetes Maternal Grandmother   . Depression Paternal Grandmother   . Heart disease Neg Hx     ALLERGIES:  is allergic to amoxicillin; clindamycin/lincomycin; and oxycodone-acetaminophen.  MEDICATIONS:  Current Outpatient Medications  Medication Sig Dispense Refill  . furosemide (LASIX) 20 MG tablet Take 1 tablet (20 mg total) by mouth daily for 14 days. 20 tablet 0  . rivaroxaban (XARELTO) 20 MG TABS tablet Take 1 tablet (20 mg total) by mouth daily with supper. 30 tablet 1   No current facility-administered medications for this visit.     REVIEW OF SYSTEMS:   Constitutional: Denies fevers, chills or abnormal night sweats Eyes: Denies blurriness of vision, double vision or watery eyes Ears, nose, mouth, throat, and face: Denies mucositis or sore throat Respiratory: Denies cough, dyspnea or wheezes Cardiovascular: Denies palpitation, chest discomfort  or lower extremity swelling Gastrointestinal:  Denies nausea, heartburn or change in bowel habits Skin: Denies abnormal skin rashes Lymphatics: Denies new lymphadenopathy or easy bruising Neurological:Denies numbness, tingling or new weaknesses Behavioral/Psych: Mood is stable, no new changes  All other systems were reviewed with the patient and are negative.  PHYSICAL EXAMINATION: ECOG PERFORMANCE STATUS: 0 - Asymptomatic  Vitals:   05/19/17 1511  BP: 134/85  Pulse: 89  Resp: 19  Temp: 98.7 F (37.1 C)  SpO2: 99%   Filed Weights   05/19/17 1511  Weight: 241 lb 3.2 oz (109.4 kg)     GENERAL:alert, no distress and comfortable SKIN: skin color, texture, turgor are normal, no rashes or significant lesions EYES: normal, conjunctiva are pink and non-injected, sclera clear OROPHARYNX:no exudate, no erythema and lips, buccal mucosa, and tongue normal  NECK: supple, thyroid normal size, non-tender, without nodularity LYMPH:  no palpable lymphadenopathy in the cervical, axillary or inguinal LUNGS: clear to auscultation and percussion with normal breathing effort HEART: regular rate & rhythm and no murmurs and no lower extremity edema ABDOMEN:abdomen soft, non-tender and normal bowel sounds Musculoskeletal:no cyanosis of digits and no clubbing  PSYCH: alert & oriented x 3 with fluent speech NEURO: no focal motor/sensory deficits  LABORATORY DATA:  I have reviewed the data as listed Lab Results  Component Value Date   WBC 8.2 11/17/2016   HGB 13.3 11/17/2016   HCT 40.3 11/17/2016   MCV 84 11/17/2016   PLT 300 11/17/2016   Lab Results  Component Value Date   NA 141 11/17/2016   K 4.1 11/17/2016   CL 104 11/17/2016   CO2 24 11/17/2016    RADIOGRAPHIC STUDIES: I have personally reviewed the radiological reports and agreed with the findings in the report.  ASSESSMENT AND PLAN:  Acute deep vein thrombosis (DVT) of popliteal vein of left lower extremity (HCC) Chronic/recurrent blood clots I discussed with the patient risk factors for blood clots.  Inherited risk factors include: 1. Factor V Leiden mutation 2. Prothrombin gene G20210A 3. Protein S deficiency  4. Protein C deficiency  5. Antithrombin deficiency  Acquired risk factors include: 1. Antiphospholipid antibody syndrome 2. Tobacco use 3. Obesity 4. Medications including oral contraceptives 5. Sedentary behavior including postoperative state 6. Foreign bodies in circulation -------------------------------------------------------------------------------------------------------------- Patient's  risk factors primarily are being overweight, sedentary job, driving long distances   Workup recommended: Bloodwork to be done today for both inherited risk factors as well as acquired risk factors I will call the patient with results of these blood tests. I would also like to do an ultrasound of her left leg to evaluate for chronic DVT.  If she does have evidence of chronic DVT then she may benefit from continued anticoagulation.  All questions were answered. The patient knows to call the clinic with any problems, questions or concerns.    Harriette Ohara, MD 05/19/17

## 2017-05-19 NOTE — Assessment & Plan Note (Signed)
Chronic/recurrent blood clots I discussed with the patient risk factors for blood clots.  Inherited risk factors include: 1. Factor V Leiden mutation 2. Prothrombin gene G20210A 3. Protein S deficiency  4. Protein C deficiency  5. Antithrombin deficiency  Acquired risk factors include: 1. Antiphospholipid antibody syndrome 2. Tobacco use 3. Obesity 4. Medications including oral contraceptives 5. Sedentary behavior including postoperative state 6. Foreign bodies in circulation  Workup recommended: Bloodwork to be done today  Return to clinic in one to 2 weeks to discuss the results of the tests

## 2017-05-20 ENCOUNTER — Telehealth: Payer: Self-pay

## 2017-05-20 LAB — HOMOCYSTEINE: HOMOCYSTEINE-NORM: 8.4 umol/L (ref 0.0–15.0)

## 2017-05-20 NOTE — Telephone Encounter (Signed)
Called and LVM for patient to let her know that her vascular doppler ultrasound is scheduled for requested date of 06/12/17 at [facility]. Requesting pt to call to confirm vm was received.

## 2017-05-21 LAB — BETA-2-GLYCOPROTEIN I ABS, IGG/M/A
Beta-2 Glyco I IgG: <9 GPI IgG units (ref 0–20)
Beta-2-Glycoprotein I IgA: <9 GPI IgA units (ref 0–25)

## 2017-05-21 LAB — LUPUS ANTICOAGULANT PANEL
DRVVT: 36.9 s (ref 0.0–47.0)
PTT Lupus Anticoagulant: 29 s (ref 0.0–51.9)

## 2017-05-21 LAB — CARDIOLIPIN ANTIBODIES, IGG, IGM, IGA: Anticardiolipin IgG: <9 GPL U/mL (ref 0–14)

## 2017-05-21 LAB — FACTOR 8 ASSAY: Coagulation Factor VIII: 201 % — ABNORMAL HIGH (ref 57–163)

## 2017-05-21 LAB — PROTEIN S, TOTAL: PROTEIN S AG TOTAL: 90 % (ref 60–150)

## 2017-05-21 LAB — PROTEIN C, TOTAL: Protein C, Total: 99 % (ref 60–150)

## 2017-05-22 LAB — FACTOR 5 LEIDEN

## 2017-05-22 LAB — PROTHROMBIN GENE MUTATION

## 2017-05-28 ENCOUNTER — Telehealth: Payer: BC Managed Care – PPO | Admitting: Physician Assistant

## 2017-05-28 DIAGNOSIS — R109 Unspecified abdominal pain: Secondary | ICD-10-CM

## 2017-05-28 DIAGNOSIS — Z87442 Personal history of urinary calculi: Secondary | ICD-10-CM

## 2017-05-28 NOTE — Progress Notes (Signed)
Based on what you shared with me it looks like you have a condition that should be evaluated in a face to face office visit. You are endorsing concerns for kidney stone giving current back pain, urinary symptoms and prior medical history. You need a good physical examination, urine assessment for signs of infection versus blood versus stone forming crystals. You may also require an x-ray or CT scan to assess for stone. Other causes of the symptoms can include a kidney infection which can turn serious very quickly. As such, I recommend you be seen in person within the next 24 hours. Stones are outside the scope of e visits.  NOTE: If you entered your credit card information for this eVisit, you will not be charged. You may see a "hold" on your card for the $30 but that hold will drop off and you will not have a charge processed.  If you are having a true medical emergency please call 911.  If you need an urgent face to face visit, [facility] has four urgent care centers for your convenience.  If you need care fast and have a high deductible or no insurance consider:   [email or web address] to reserve your spot online an avoid wait times  [facility] [address] [place], [place] [address] 8 am to 8 pm Monday-Friday 10 am to 4 pm Saturday-Sunday *Across the street from [employer]  [place], [address] 8 am to 5 pm Monday-Friday * In the [facility] on the [facility]   The following sites will take your  insurance:  . [facility] Urgent [place] a Provider at this Location  [address] [place], [place] [address] . 10 am to 8 pm Monday-Friday . 12 pm to 8 pm Saturday-Sunday   . [facility] Urgent [facility] at [place] a Provider at this Location  [place], [place], [place]  [address] . 8 am to 8 pm Monday-Friday . 9 am to 6 pm Saturday . 11 am to 6 pm Sunday   . [facility]gent Care at [place] Get Driving Directions  [address] [place], [place] [address] . 8 am to 8 pm Monday-Friday . 8 am to 4 pm Saturday-Sunday   Your e-visit answers were reviewed by a board certified advanced clinical practitioner to complete your personal care plan.  Thank you for using e-Visits.

## 2017-06-03 ENCOUNTER — Ambulatory Visit (INDEPENDENT_AMBULATORY_CARE_PROVIDER_SITE_OTHER)
Admission: RE | Admit: 2017-06-03 | Discharge: 2017-06-03 | Disposition: A | Discharge status: Home / Self Care | Payer: BC Managed Care – PPO | Source: Ambulatory Visit | Attending: Family Medicine | Admitting: Family Medicine

## 2017-06-03 ENCOUNTER — Encounter: Payer: Self-pay | Admitting: Family Medicine

## 2017-06-03 ENCOUNTER — Ambulatory Visit: Payer: BC Managed Care – PPO | Admitting: Family Medicine

## 2017-06-03 VITALS — BP 120/70 | HR 73 | Temp 98.5°F | Wt 240.0 lb

## 2017-06-03 DIAGNOSIS — M545 Low back pain: Secondary | ICD-10-CM

## 2017-06-03 DIAGNOSIS — R109 Unspecified abdominal pain: Secondary | ICD-10-CM | POA: Diagnosis not present

## 2017-06-03 LAB — POC URINALSYSI DIPSTICK (AUTOMATED)
BILIRUBIN UA: NEGATIVE
Glucose, UA: NEGATIVE
KETONES UA: NEGATIVE
Nitrite, UA: NEGATIVE
PH UA: 6 (ref 5.0–8.0)
PROTEIN UA: NEGATIVE
RBC UA: NEGATIVE
Spec Grav, UA: 1.025 (ref 1.010–1.025)
Urobilinogen, UA: 0.2 E.U./dL

## 2017-06-03 NOTE — Assessment & Plan Note (Addendum)
Initial severe L flank pain that lasted 8 hrs, resolved after voiding sounds consistent with passed nephrolithiasis. However, she has had persistent bilateral lower back, L flank, and now R abdominal discomfort since initial severe pain has subsided. Unclear source. UA today with 1+ LE but microscopy was not too concerning - UCx sent. Will check Cr and CBC today to further eval for hydronephrosis, infection. Will check KUB to see if we catch any persistent stone disease. Red flags to seek further care reviewed. In interim, discussed treating discomfort with tylenol/ibuprofen. KUB clear on my read.

## 2017-06-03 NOTE — Patient Instructions (Addendum)
Culture sent today. We will be in touch with results.  KUB today. Labs today (check kidneys and for signs of infection) For now, push fluids, may take tylenol 500mg  or ibuprofen for discomfort 400mg  with meals.  If worsening pain, if fever >101, if recurrent nausea/vomiting, let us know.

## 2017-06-03 NOTE — Progress Notes (Signed)
BP 120/70 (BP Location: Left Arm, Patient Position: Sitting, Cuff Size: Large)   Pulse 73   Temp 98.5 F (36.9 C) (Oral)   Wt 240 lb (108.9 kg)   LMP 05/17/2017   SpO2 97%   BMI 43.20 kg/m    CC: lower back pain  Subjective:    Patient ID: Ariana Mcdaniel, female    DOB: 1975/08/19, 42 y.o.   MRN: [id number]  HPI: Ariana Mcdaniel is a 42 y.o. female presenting on 06/03/2017 for Back Pain (Located in lower back on either side. Passed a kidney stonef 05/28/17. )   Thursday 05/28/2017 had severe sharp L flank pain with radiation into the groin that lasted 8 hrs, with hematuria and incomplete emptying, nausea and vomiting, then significantly improved after voiding - never saw stone. Treated pain with tylenol.   Since then, has had ongoing dull discomfort L side for several days. Yesterday noticed increasing pain bilateral lower back which affected ability to sleep. Today ongoing pain but also has L-sided abdominal discomfort and bloated sensation.   No fevers/chills, persistent dysuria, urgency, frequency, hematuria. No bowel changes, blood in stool.   H/o DVT 01/2017 s/p 3 months of xarelto.  Prior kidney stone ~2015.   Relevant past medical, surgical, family and social history reviewed and updated as indicated. Interim medical history since our last visit reviewed. Allergies and medications reviewed and updated. Outpatient Medications Prior to Visit  Medication Sig Dispense Refill  . furosemide (LASIX) 20 MG tablet Take 1 tablet (20 mg total) by mouth daily for 14 days. 20 tablet 0  . rivaroxaban (XARELTO) 20 MG TABS tablet Take 1 tablet (20 mg total) by mouth daily with supper. 30 tablet 1   No facility-administered medications prior to visit.      Per HPI unless specifically indicated in ROS section below Review of Systems     Objective:    BP 120/70 (BP Location: Left Arm, Patient Position: Sitting, Cuff Size: Large)   Pulse 73   Temp 98.5 F (36.9 C) (Oral)   Wt 240 lb  (108.9 kg)   LMP 05/17/2017   SpO2 97%   BMI 43.20 kg/m   Wt Readings from Last 3 Encounters:  06/03/17 240 lb (108.9 kg)  05/19/17 241 lb 3.2 oz (109.4 kg)  04/15/17 240 lb 2 oz (108.9 kg)    Physical Exam  Constitutional: She appears well-developed and well-nourished. No distress.  HENT:  Mouth/Throat: Oropharynx is clear and moist. No oropharyngeal exudate.  Cardiovascular: Normal rate, regular rhythm, normal heart sounds and intact distal pulses.  No murmur heard. Pulmonary/Chest: Effort normal and breath sounds normal. No respiratory distress. She has no wheezes. She has no rales.  Abdominal: Soft. Normal appearance and bowel sounds are normal. She exhibits no distension and no mass. There is no hepatosplenomegaly. There is tenderness (mild) in the suprapubic area and left lower quadrant. There is no rigidity, no rebound, no guarding, no CVA tenderness and negative Murphy's sign.  Musculoskeletal: Normal range of motion. She exhibits no edema.  Skin: Skin is warm and dry.  Psychiatric: She has a normal mood and affect.  Nursing note and vitals reviewed.  Lab Results  Component Value Date   CREATININE 0.88 11/17/2016   BUN 10 11/17/2016   NA 141 11/17/2016   K 4.1 11/17/2016   CL 104 11/17/2016   CO2 24 11/17/2016    Results for orders placed or performed in visit on 06/03/17  POCT Urinalysis Dipstick (Automated)  Result  Value Ref Range   Color, UA yellow    Clarity, UA clear    Glucose, UA negative    Bilirubin, UA negative    Ketones, UA negative    Spec Grav, UA 1.025 1.010 - 1.025   Blood, UA negative    pH, UA 6.0 5.0 - 8.0   Protein, UA negative    Urobilinogen, UA 0.2 0.2 or 1.0 E.U./dL   Nitrite, UA negative    Leukocytes, UA Small (1+) (A) Negative       Assessment & Plan:   Problem List Items Addressed This Visit    Left flank pain - Primary    Initial severe L flank pain that lasted 8 hrs, resolved after voiding sounds consistent with passed  nephrolithiasis. However, she has had persistent bilateral lower back, L flank, and now R abdominal discomfort since initial severe pain has subsided. Unclear source. UA today with 1+ LE but microscopy was not too concerning - UCx sent. Will check Cr and CBC today to further eval for hydronephrosis, infection. Will check KUB to see if we catch any persistent stone disease. Red flags to seek further care reviewed. In interim, discussed treating discomfort with tylenol/ibuprofen. KUB clear on my read.       Relevant Orders   POCT Urinalysis Dipstick (Automated) (Completed)   Urine Culture   Basic metabolic panel   CBC with Differential/Platelet   DG Abd 1 View       No orders of the defined types were placed in this encounter.  Orders Placed This Encounter  Procedures  . Urine Culture  . DG Abd 1 View    Standing Status:   Future    Number of Occurrences:   1    Standing Expiration Date:   08/04/2018    Order Specific Question:   Reason for Exam (SYMPTOM  OR DIAGNOSIS REQUIRED)    Answer:   L flank pain, lower back pain    Order Specific Question:   Is patient pregnant?    Answer:   No    Order Specific Question:   Preferred imaging location?    Answer:   [facility]    Order Specific Question:   Radiology Contrast Protocol - do NOT remove file path    Answer:   [email or web address]  . Basic metabolic panel  . CBC with Differential/Platelet  . POCT Urinalysis Dipstick (Automated)    Follow up plan: Return if symptoms worsen or fail to improve.  Ria Bush, MD

## 2017-06-04 LAB — URINE CULTURE
MICRO NUMBER:: 90382626
Result:: NO GROWTH
SPECIMEN QUALITY: ADEQUATE

## 2017-06-04 LAB — BASIC METABOLIC PANEL
BUN: 14 mg/dL (ref 6–23)
CO2: 26 meq/L (ref 19–32)
Calcium: 8.7 mg/dL (ref 8.4–10.5)
Chloride: 104 mEq/L (ref 96–112)
Creatinine, Ser: 0.7 mg/dL (ref 0.40–1.20)
GFR: 98.84 mL/min (ref >60.00)
GLUCOSE: 98 mg/dL (ref 70–99)
POTASSIUM: 4.1 meq/L (ref 3.5–5.1)
SODIUM: 136 meq/L (ref 135–145)

## 2017-06-04 LAB — CBC WITH DIFFERENTIAL/PLATELET
Basophils Absolute: 0.1 10*3/uL (ref 0.0–0.1)
Basophils Relative: 1 % (ref 0.0–3.0)
EOS PCT: 1.7 % (ref 0.0–5.0)
Eosinophils Absolute: 0.1 10*3/uL (ref 0.0–0.7)
HCT: 36.3 % (ref 36.0–46.0)
Hemoglobin: 11.7 g/dL — ABNORMAL LOW (ref 12.0–15.0)
LYMPHS ABS: 2.6 10*3/uL (ref 0.7–4.0)
Lymphocytes Relative: 31.7 % (ref 12.0–46.0)
MCHC: 32.4 g/dL (ref 30.0–36.0)
MCV: 79 fl (ref 78.0–100.0)
MONO ABS: 0.6 10*3/uL (ref 0.1–1.0)
MONOS PCT: 7.2 % (ref 3.0–12.0)
NEUTROS ABS: 4.8 10*3/uL (ref 1.4–7.7)
NEUTROS PCT: 58.4 % (ref 43.0–77.0)
PLATELETS: 314 10*3/uL (ref 150.0–400.0)
RBC: 4.59 Mil/uL (ref 3.87–5.11)
RDW: 14 % (ref 11.5–15.5)
WBC: 8.2 10*3/uL (ref 4.0–10.5)

## 2017-06-10 ENCOUNTER — Other Ambulatory Visit: Payer: Self-pay

## 2017-06-10 MED ORDER — RIVAROXABAN 20 MG PO TABS: 20.0000 mg | ORAL_TABLET | Freq: Every day | ORAL | 0 refills | Status: DC

## 2017-06-10 NOTE — Telephone Encounter (Signed)
Informed pt that per Dr. Lindi Adie she would be on 3 more months of xarelto and then stop for DVT prophylaxis. Script sent in to patient pharmacy. No further needs at this time.  Cyndia Bent RN

## 2017-06-12 ENCOUNTER — Ambulatory Visit (HOSPITAL_COMMUNITY): Admission: RE | Admit: 2017-06-12 | Payer: BC Managed Care – PPO | Source: Ambulatory Visit

## 2017-07-21 ENCOUNTER — Ambulatory Visit: Payer: BC Managed Care – PPO | Admitting: Internal Medicine

## 2017-07-21 ENCOUNTER — Encounter: Payer: Self-pay | Admitting: Internal Medicine

## 2017-07-21 VITALS — BP 124/82 | HR 101 | Temp 98.1°F | Wt 238.0 lb

## 2017-07-21 DIAGNOSIS — J029 Acute pharyngitis, unspecified: Secondary | ICD-10-CM | POA: Diagnosis not present

## 2017-07-21 NOTE — Progress Notes (Signed)
Subjective:    Patient ID: Ariana Mcdaniel, female    DOB: [birth year], 42 y.o.   MRN: [id number]  HPI  Pt presents to the clinic today with c/o sore throat. She reports this started 2-3 days ago. She denies difficulty swallowing. She denies runny nose, nasal congestion, ear pain or cough. She denies fever or body aches but did have some chills last night. She has tried Tylenol and Dayquil with minimal relief. She has a history of allergies. She has not had sick contacts.  Review of Systems      Past Medical History:  Diagnosis Date  . Allergic rhinitis due to pollen   . DVT (deep venous thrombosis) ([place]) 01/2017  . Mastodynia     Current Outpatient Medications  Medication Sig Dispense Refill  . rivaroxaban (XARELTO) 20 MG TABS tablet Take 1 tablet (20 mg total) by mouth daily with supper. (Patient not taking: Reported on 07/21/2017) 90 tablet 0   No current facility-administered medications for this visit.     Allergies  Allergen Reactions  . Amoxicillin     itching  . Clindamycin/Lincomycin     Caused a superinfection  . Oxycodone-Acetaminophen Hives    Family History  Problem Relation Age of Onset  . Hypertension Maternal Grandmother   . Diabetes Maternal Grandmother   . Depression Paternal Grandmother   . Heart disease Neg Hx     Social History   Socioeconomic History  . Marital status: Married    Spouse name: Not on file  . Number of children: 2  . Years of education: Not on file  . Highest education level: Not on file  Occupational History  . Occupation: Engineer, civil (consulting): [employer]  . Financial resource strain: Not on file  . Food insecurity:    Worry: Not on file    Inability: Not on file  . Transportation needs:    Medical: Not on file    Non-medical: Not on file  Tobacco Use  . Smoking status: Former Smoker    Last attempt to quit: 01/04/2010    Years since quitting: 7.5  . Smokeless tobacco: Never Used    Substance and Sexual Activity  . Alcohol use: Yes    Alcohol/week: 1.2 oz    Types: 2 Cans of beer per week  . Drug use: No  . Sexual activity: Yes    Partners: Male    Birth control/protection: None    Comment: Vasectomy  Lifestyle  . Physical activity:    Days per week: Not on file    Minutes per session: Not on file  . Stress: Not on file  Relationships  . Social connections:    Talks on phone: Not on file    Gets together: Not on file    Attends religious service: Not on file    Active member of club or organization: Not on file    Attends meetings of clubs or organizations: Not on file    Relationship status: Not on file  . Intimate partner violence:    Fear of current or ex partner: Not on file    Emotionally abused: Not on file    Physically abused: Not on file    Forced sexual activity: Not on file  Other Topics Concern  . Not on file  Social History Narrative   2nd marriage     Constitutional: Denies fever, malaise, fatigue, headache or abrupt weight changes.  HEENT: Pt reports sore  throat. Denies eye pain, eye redness, ear pain, ringing in the ears, wax buildup, runny nose, nasal congestion, bloody nose. Respiratory: Denies difficulty breathing, shortness of breath, cough or sputum production.   Cardiovascular: Denies chest pain, chest tightness, palpitations or swelling in the hands or feet.   No other specific complaints in a complete review of systems (except as listed in HPI above).  Objective:   Physical Exam   BP 124/82   Pulse (!) 101   Temp 98.1 F (36.7 C) (Oral)   Wt 238 lb (108 kg)   LMP 07/02/2017   SpO2 97%   BMI 42.84 kg/m  Wt Readings from Last 3 Encounters:  07/21/17 238 lb (108 kg)  06/03/17 240 lb (108.9 kg)  05/19/17 241 lb 3.2 oz (109.4 kg)    General: Appears her stated age, in NAD. HEENT: Throat/Mouth: Teeth present, mucosa erythematous and moist, no exudate, lesions or ulcerations noted.  Neck:  No adenopathy  noted. Cardiovascular: Normal rate and rhythm.  Pulmonary/Chest: Normal effort and positive vesicular breath sounds. No respiratory distress. No wheezes, rales or ronchi noted.   BMET    Component Value Date/Time   NA 136 06/03/2017 1750   NA 141 11/17/2016 1556   K 4.1 06/03/2017 1750   CL 104 06/03/2017 1750   CO2 26 06/03/2017 1750   GLUCOSE 98 06/03/2017 1750   BUN 14 06/03/2017 1750   BUN 10 11/17/2016 1556   CREATININE 0.70 06/03/2017 1750   CREATININE 0.73 03/28/2015 1738   CALCIUM 8.7 06/03/2017 1750   GFRNONAA 84 11/17/2016 1556   GFRAA 96 11/17/2016 1556    Lipid Panel     Component Value Date/Time   CHOL 213 (H) 11/17/2016 1556   TRIG 269 (H) 11/17/2016 1556   HDL 51 11/17/2016 1556   CHOLHDL 4.2 11/17/2016 1556   CHOLHDL 3.3 08/20/2012 0921   VLDL 20 08/20/2012 0921   LDLCALC 108 (H) 11/17/2016 1556    CBC    Component Value Date/Time   WBC 8.2 06/03/2017 1736   RBC 4.59 06/03/2017 1736   HGB 11.7 (L) 06/03/2017 1736   HGB 13.3 11/17/2016 1556   HCT 36.3 06/03/2017 1736   HCT 40.3 11/17/2016 1556   PLT 314.0 06/03/2017 1736   PLT 300 11/17/2016 1556   MCV 79.0 06/03/2017 1736   MCV 84 11/17/2016 1556   MCH 27.8 11/17/2016 1556   MCH 28.0 03/28/2015 1741   MCH 28.8 07/12/2012 1550   MCHC 32.4 06/03/2017 1736   RDW 14.0 06/03/2017 1736   RDW 13.1 11/17/2016 1556   LYMPHSABS 2.6 06/03/2017 1736   MONOABS 0.6 06/03/2017 1736   EOSABS 0.1 06/03/2017 1736   BASOSABS 0.1 06/03/2017 1736    Hgb A1C No results found for: HGBA1C         Assessment & Plan:   Sore Throat:  RST: negative Encouraged salt water gargles Ibuprofen as needed for pain and inflammation 80 mg Depo IM today  Return precautions discussed Webb Silversmith, NP

## 2017-07-21 NOTE — Patient Instructions (Signed)

## 2017-08-21 ENCOUNTER — Ambulatory Visit: Payer: Self-pay

## 2017-08-21 NOTE — Telephone Encounter (Signed)
Pt. called to report feeling intermittent heart palpitations for past 1.5 weeks.  Described that it feels like her heart is skipping.   Stated her resting heart rate runs 70-83 bpm.  With activity, it goes up to 105-110 bpm.  Reported pulse rate is 86 bpm, at present time.  Stated "it feels like a bubble or flutter sensation  in my throat and upper chest, near the collar bone."   Reported that it can last several minutes to an hour, and occurs intermittently throughout the day. Denied dizziness, shortness of breath, or sweating.  Reported she was drinking Slim Fast BID, morning and lunch, and has stopped this, in case it was contributing to the palpitations.  Also, reported that Nexium was a new medication for her, when the palpitations started.  Stated she has stopped taking the Nexium at this time.  Reported drinks 3-4, 30 oz. Glasses of water/ day.  Drinks minimal caffeine.  Reported she is presently in [place], for a graduation, and had some alcoholic drinks, last night.  Per protocol, scheduled an appt. On 6/18 at PCP office.  (Pt. reported she is not available to come in for appt. on 6/17)  Care advice given per protocol.  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     Reason for Disposition . [1] Palpitations AND [2] no improvement after following Care Advice  Answer Assessment - Initial Assessment Questions 1. DESCRIPTION: "Please describe your heart rate or heart beat that you are having" (e.g., fast/slow, regular/irregular, skipped or extra beats, "palpitations")     Feels like heart is skipping  2. ONSET: "When did it start?" (Minutes, hours or days)     1.5 weeks ago 3. DURATION: "How long does it last" (e.g., seconds, minutes, hours)    Estimates it lasts several minutes to an hour 4. PATTERN "Does it come and go, or has it been constant since it started?"  "Does it get worse with exertion?"   "Are you feeling it now?"     Comes and goes  5. TAP:  "Using your hand, can you tap out what you are feeling on a chair or table in front of you, so that I can hear?" (Note: not all patients can do this)       Not asked  6. HEART RATE: "Can you tell me your heart rate?" "How many beats in 15 seconds?"  (Note: not all patients can do this)       Resting rate is 70-86 bpm  7. RECURRENT SYMPTOM: "Have you ever had this before?" If so, ask: "When was the last time?" and "What happened that time?"       8. CAUSE: "What do you think is causing the palpitations?"     unknown 9. CARDIAC HISTORY: "Do you have any history of heart disease?" (e.g., heart attack, angina, bypass surgery, angioplasty, arrhythmia)      denied 10. OTHER SYMPTOMS: "Do you have any other symptoms?" (e.g., dizziness, chest pain, sweating, difficulty breathing)       Denied dizziness or near fainting; "it feels like a bubble in my throat or upper chest / collar bone area."  Denied shortness of breath   11. PREGNANCY: "Is there any chance you are pregnant?" "When was your last menstrual period?"       LMP; 28 days ago  Protocols used: [place]

## 2017-08-25 ENCOUNTER — Ambulatory Visit: Payer: BC Managed Care – PPO | Admitting: Family Medicine

## 2017-08-27 ENCOUNTER — Encounter: Payer: Self-pay | Admitting: Family Medicine

## 2017-08-27 ENCOUNTER — Ambulatory Visit: Payer: BC Managed Care – PPO | Admitting: Family Medicine

## 2017-08-27 VITALS — BP 106/72 | HR 95 | Temp 98.7°F | Ht 62.5 in | Wt 239.2 lb

## 2017-08-27 DIAGNOSIS — R002 Palpitations: Secondary | ICD-10-CM | POA: Diagnosis not present

## 2017-08-27 MED ORDER — METOPROLOL TARTRATE 25 MG PO TABS: 12.5000 mg | ORAL_TABLET | Freq: Every day | ORAL | 0 refills | Status: DC | PRN

## 2017-08-27 NOTE — Patient Instructions (Signed)
Go to the lab on the way out.  We'll contact you with your lab report. Try taking 1/2 tab of metoprolol.  Update [place] as needed.   If you a have profoundly elevated heart rate then get checked.   Take care.  Glad to see you.

## 2017-08-27 NOTE — Progress Notes (Signed)
No cardiac hx prior.  Started noting palpitations in the last ~2 weeks.   Started drinking slim fast on 08/10/17.  That was the only change other than taking nexium.  Slim fast doesn't have caffeine.  Then started having palpitations, episodes with extra beats.  She cut the slim fast out but sx continued.  She cut out nexium, unclear if that helped.  Now still with episodic extra beats that she can feel.    She can walk w/o CP.  No CP at all but she feels the need to cough with an episode.  No passing out.  Not lightheaded.  Nonsmoker.  No FCNAVD.    No caffeine.  No other stimulants.    She doesn't snore or wake gasping for air.    Meds, vitals, and allergies reviewed.   ROS: Per HPI unless specifically indicated in ROS section   GEN: nad, alert and oriented HEENT: mucous membranes moist NECK: supple w/o LA CV: rrr with occ ectopy noted but no sustained tachycardia.  no murmur PULM: ctab, no inc wob ABD: soft, +bs EXT: no edema SKIN: no acute rash

## 2017-08-28 DIAGNOSIS — R002 Palpitations: Secondary | ICD-10-CM | POA: Insufficient documentation

## 2017-08-28 LAB — CBC WITH DIFFERENTIAL/PLATELET
BASOS ABS: 0.1 10*3/uL (ref 0.0–0.1)
BASOS PCT: 1.2 % (ref 0.0–3.0)
EOS ABS: 0.1 10*3/uL (ref 0.0–0.7)
Eosinophils Relative: 1.6 % (ref 0.0–5.0)
HCT: 36.3 % (ref 36.0–46.0)
Hemoglobin: 11.9 g/dL — ABNORMAL LOW (ref 12.0–15.0)
Lymphocytes Relative: 28.1 % (ref 12.0–46.0)
Lymphs Abs: 2.4 10*3/uL (ref 0.7–4.0)
MCHC: 32.8 g/dL (ref 30.0–36.0)
MCV: 79.1 fl (ref 78.0–100.0)
MONO ABS: 0.6 10*3/uL (ref 0.1–1.0)
Monocytes Relative: 6.4 % (ref 3.0–12.0)
NEUTROS ABS: 5.5 10*3/uL (ref 1.4–7.7)
Neutrophils Relative %: 62.7 % (ref 43.0–77.0)
PLATELETS: 305 10*3/uL (ref 150.0–400.0)
RBC: 4.58 Mil/uL (ref 3.87–5.11)
RDW: 15.5 % (ref 11.5–15.5)
WBC: 8.7 10*3/uL (ref 4.0–10.5)

## 2017-08-28 LAB — BASIC METABOLIC PANEL
BUN: 14 mg/dL (ref 6–23)
CO2: 27 mEq/L (ref 19–32)
Calcium: 8.9 mg/dL (ref 8.4–10.5)
Chloride: 106 mEq/L (ref 96–112)
Creatinine, Ser: 0.85 mg/dL (ref 0.40–1.20)
GFR: 78.9 mL/min (ref >60.00)
Glucose, Bld: 83 mg/dL (ref 70–99)
POTASSIUM: 4.3 meq/L (ref 3.5–5.1)
SODIUM: 141 meq/L (ref 135–145)

## 2017-08-28 LAB — TSH: TSH: 1.76 u[IU]/mL (ref 0.35–4.50)

## 2017-08-28 NOTE — Assessment & Plan Note (Addendum)
With benign-appearing PACs noted on EKG.  She had symptoms while the EKG was being done.  This is likely the issue causing her symptoms.  She does not have known sleep apnea.  She is not using extra caffeine.  Check basic labs.  Start low-dose of metoprolol.  Update [place] as needed.  if she continues to have symptoms we can work-up further and/or have her see cardiology.  Discussed with patient.  She agrees.

## 2017-09-22 ENCOUNTER — Encounter: Payer: Self-pay | Admitting: Radiology

## 2017-10-03 ENCOUNTER — Other Ambulatory Visit: Payer: Self-pay | Admitting: Hematology and Oncology

## 2017-10-08 ENCOUNTER — Telehealth: Payer: Self-pay | Admitting: *Deleted

## 2017-10-08 NOTE — Telephone Encounter (Signed)
Per MD review of refill request for Xaralto - refill denied due to pt has completed therapy.  This RN denied refill per Epic request.  Message left on pt's VM per request to return call to verify plan.

## 2017-10-14 ENCOUNTER — Other Ambulatory Visit: Payer: Self-pay

## 2017-10-14 NOTE — Telephone Encounter (Signed)
Error

## 2018-06-03 ENCOUNTER — Ambulatory Visit: Payer: BC Managed Care – PPO | Admitting: Family Medicine

## 2018-08-17 ENCOUNTER — Telehealth: Payer: Self-pay | Admitting: Radiology

## 2018-08-17 NOTE — Telephone Encounter (Signed)
Left message to call [id number] to schedule Annual exam with Dr Kennon Rounds (last 11/17/16)

## 2018-09-07 ENCOUNTER — Ambulatory Visit: Payer: BC Managed Care – PPO | Admitting: Family Medicine

## 2018-10-11 ENCOUNTER — Ambulatory Visit: Payer: BC Managed Care – PPO | Admitting: Family Medicine

## 2018-10-14 ENCOUNTER — Encounter: Payer: Self-pay | Admitting: Family Medicine

## 2018-10-14 ENCOUNTER — Ambulatory Visit (INDEPENDENT_AMBULATORY_CARE_PROVIDER_SITE_OTHER): Payer: BC Managed Care – PPO | Admitting: Family Medicine

## 2018-10-14 ENCOUNTER — Other Ambulatory Visit: Payer: Self-pay

## 2018-10-14 VITALS — BP 119/80 | HR 85 | Ht 62.0 in | Wt 243.0 lb

## 2018-10-14 DIAGNOSIS — Z124 Encounter for screening for malignant neoplasm of cervix: Secondary | ICD-10-CM

## 2018-10-14 DIAGNOSIS — N920 Excessive and frequent menstruation with regular cycle: Secondary | ICD-10-CM

## 2018-10-14 DIAGNOSIS — Z01419 Encounter for gynecological examination (general) (routine) without abnormal findings: Secondary | ICD-10-CM | POA: Diagnosis not present

## 2018-10-14 DIAGNOSIS — L732 Hidradenitis suppurativa: Secondary | ICD-10-CM

## 2018-10-14 DIAGNOSIS — Z1151 Encounter for screening for human papillomavirus (HPV): Secondary | ICD-10-CM | POA: Diagnosis not present

## 2018-10-14 DIAGNOSIS — I82432 Acute embolism and thrombosis of left popliteal vein: Secondary | ICD-10-CM

## 2018-10-14 MED ORDER — MEGESTROL ACETATE 40 MG PO TABS: 40.0000 mg | ORAL_TABLET | Freq: Two times a day (BID) | ORAL | 1 refills | Status: DC

## 2018-10-14 MED ORDER — CHLORHEXIDINE GLUCONATE 4 % EX LIQD: Freq: Every day | CUTANEOUS | 0 refills | Status: DC | PRN

## 2018-10-14 NOTE — Progress Notes (Signed)
Would like to discuss getting ablation

## 2018-10-14 NOTE — Progress Notes (Signed)
  Subjective:     Ariana Mcdaniel is a 43 y.o. female and is here for a comprehensive physical exam. The patient reports problems - heavy vaginal bleeding.notes her cycles are heavy and pain, lots of cramps and back pain. There is a lot of clots. Cycles are once/month. Uses [employer]plus tampons and changes them every hour. Lasts 5-6 days.  The following portions of the patient's history were reviewed and updated as appropriate: allergies, current medications, past family history, past medical history, past social history, past surgical history and problem list.  Review of Systems Pertinent items noted in HPI and remainder of comprehensive ROS otherwise negative.   Objective:    BP 119/80   Pulse 85   Ht 5\' 2"  (1.575 m)   Wt 243 lb (110.2 kg)   LMP 09/29/2018 (Exact Date)   BMI 44.45 kg/m  General appearance: alert, cooperative and appears stated age Head: Normocephalic, without obvious abnormality, atraumatic Neck: no adenopathy, supple, symmetrical, trachea midline and thyroid not enlarged, symmetric, no tenderness/mass/nodules Lungs: clear to auscultation bilaterally Heart: regular rate and rhythm, S1, S2 normal, no murmur, click, rub or gallop Abdomen: soft, non-tender; bowel sounds normal; no masses,  no organomegaly Pelvic: cervix normal in appearance, external genitalia normal, no adnexal masses or tenderness, no cervical motion tenderness, uterus normal size, shape, and consistency and vagina normal without discharge Extremities: extremities normal, atraumatic, no cyanosis or edema Pulses: 2+ and symmetric Skin: Skin color, texture, turgor normal. No rashes or lesions Lymph nodes: Cervical, supraclavicular, and axillary nodes normal. Neurologic: Grossly normal    Assessment:    Healthy female exam.      Plan:   Problem List Items Addressed This Visit      Unprioritized   Menorrhagia with regular cycle    For endometrial ablation. Risks include but are not limited to  bleeding, infection, injury to surrounding structures, including bowel, bladder and ureters, blood clots, and death.  Likelihood of success is high.       Relevant Medications   megestrol (MEGACE) 40 MG tablet   DVT (deep venous thrombosis) ([place])    To be placed on Megace--low dose prior to procedure--no estrogen, should be ok for 2-4 wks.       Other Visit Diagnoses    Encounter for gynecological examination without abnormal finding    -  Primary   Relevant Orders   Cytology - PAP   MM 3D SCREEN BREAST BILATERAL   CBC   TSH   Comprehensive metabolic panel   Lipid panel   Hemoglobin A1c   Hidradenitis       Relevant Medications   chlorhexidine (HIBICLENS) 4 % external liquid     Return in 1 year (on 10/14/2019).    See After Visit Summary for Counseling Recommendations

## 2018-10-14 NOTE — Assessment & Plan Note (Signed)
For endometrial ablation. Risks include but are not limited to bleeding, infection, injury to surrounding structures, including bowel, bladder and ureters, blood clots, and death.  Likelihood of success is high.

## 2018-10-14 NOTE — Patient Instructions (Addendum)
 Preventive Care 21-41 Years Old, Female Preventive care refers to visits with your health care provider and lifestyle choices that can promote health and wellness. This includes:  A yearly physical exam. This may also be called an annual well check.  Regular dental visits and eye exams.  Immunizations.  Screening for certain conditions.  Healthy lifestyle choices, such as eating a healthy diet, getting regular exercise, not using drugs or products that contain nicotine and tobacco, and limiting alcohol use. What can I expect for my preventive care visit? Physical exam Your health care provider will check your:  Height and weight. This may be used to calculate body mass index (BMI), which tells if you are at a healthy weight.  Heart rate and blood pressure.  Skin for abnormal spots. Counseling Your health care provider may ask you questions about your:  Alcohol, tobacco, and drug use.  Emotional well-being.  Home and relationship well-being.  Sexual activity.  Eating habits.  Work and work environment.  Method of birth control.  Menstrual cycle.  Pregnancy history. What immunizations do I need?  Influenza (flu) vaccine  This is recommended every year. Tetanus, diphtheria, and pertussis (Tdap) vaccine  You may need a Td booster every 10 years. Varicella (chickenpox) vaccine  You may need this if you have not been vaccinated. Human papillomavirus (HPV) vaccine  If recommended by your health care provider, you may need three doses over 6 months. Measles, mumps, and rubella (MMR) vaccine  You may need at least one dose of MMR. You may also need a second dose. Meningococcal conjugate (MenACWY) vaccine  One dose is recommended if you are age 19-21 years and a first-year college student living in a residence hall, or if you have one of several medical conditions. You may also need additional booster doses. Pneumococcal conjugate (PCV13) vaccine  You may need  this if you have certain conditions and were not previously vaccinated. Pneumococcal polysaccharide (PPSV23) vaccine  You may need one or two doses if you smoke cigarettes or if you have certain conditions. Hepatitis A vaccine  You may need this if you have certain conditions or if you travel or work in places where you may be exposed to hepatitis A. Hepatitis B vaccine  You may need this if you have certain conditions or if you travel or work in places where you may be exposed to hepatitis B. Haemophilus influenzae type b (Hib) vaccine  You may need this if you have certain conditions. You may receive vaccines as individual doses or as more than one vaccine together in one shot (combination vaccines). Talk with your health care provider about the risks and benefits of combination vaccines. What tests do I need?  Blood tests  Lipid and cholesterol levels. These may be checked every 5 years starting at age 20.  Hepatitis C test.  Hepatitis B test. Screening  Diabetes screening. This is done by checking your blood sugar (glucose) after you have not eaten for a while (fasting).  Sexually transmitted disease (STD) testing.  BRCA-related cancer screening. This may be done if you have a family history of breast, ovarian, tubal, or peritoneal cancers.  Pelvic exam and Pap test. This may be done every 3 years starting at age 21. Starting at age 30, this may be done every 5 years if you have a Pap test in combination with an HPV test. Talk with your health care provider about your test results, treatment options, and if necessary, the need for more   tests. Follow these instructions at home: Eating and drinking   Eat a diet that includes fresh fruits and vegetables, whole grains, lean protein, and low-fat dairy.  Take vitamin and mineral supplements as recommended by your health care provider.  Do not drink alcohol if: ? Your health care provider tells you not to drink. ? You are  pregnant, may be pregnant, or are planning to become pregnant.  If you drink alcohol: ? Limit how much you have to 0-1 drink a day. ? Be aware of how much alcohol is in your drink. In the [place] one drink equals one 12 oz bottle of beer (355 mL), one 5 oz glass of wine (148 mL), or one 1 oz glass of hard liquor (44 mL). Lifestyle  Take daily care of your teeth and gums.  Stay active. Exercise for at least 30 minutes on 5 or more days each week.  Do not use any products that contain nicotine or tobacco, such as cigarettes, e-cigarettes, and chewing tobacco. If you need help quitting, ask your health care provider.  If you are sexually active, practice safe sex. Use a condom or other form of birth control (contraception) in order to prevent pregnancy and STIs (sexually transmitted infections). If you plan to become pregnant, see your health care provider for a preconception visit. What's next?  Visit your health care provider once a year for a well check visit.  Ask your health care provider how often you should have your eyes and teeth checked.  Stay up to date on all vaccines. This information is not intended to replace advice given to you by your health care provider. Make sure you discuss any questions you have with your health care provider. Document Released: 04/22/2001 Document Revised: 11/05/2017 Document Reviewed: 11/05/2017 Elsevier Patient Education  2020 [employer].  Hidradenitis Suppurativa Hidradenitis suppurativa is a long-term (chronic) skin disease. It is similar to a severe form of acne, but it affects areas of the body where acne would be unusual, especially areas of the body where skin rubs against skin and becomes moist. These include:  Underarms.  Groin.  Genital area.  Buttocks.  Upper thighs.  Breasts. Hidradenitis suppurativa may start out as small lumps or pimples caused by blocked sweat glands or hair follicles. Pimples may develop into deep sores  that break open (rupture) and drain pus. Over time, affected areas of skin may thicken and become scarred. This condition is rare and does not spread from person to person (non-contagious). What are the causes? The exact cause of this condition is not known. It may be related to:  Female and female hormones.  An overactive disease-fighting system (immune system). The immune system may over-react to blocked hair follicles or sweat glands and cause swelling and pus-filled sores. What increases the risk? You are more likely to develop this condition if you:  Are female.  Are 55-54 years old.  Have a family history of hidradenitis suppurativa.  Have a personal history of acne.  Are overweight.  Smoke.  Take the medicine lithium. What are the signs or symptoms? The first symptoms are usually painful bumps in the skin, similar to pimples. The condition may get worse over time (progress), or it may only cause mild symptoms. If the disease progresses, symptoms may include:  Skin bumps getting bigger and growing deeper into the skin.  Bumps rupturing and draining pus.  Itchy, infected skin.  Skin getting thicker and scarred.  Tunnels under the skin (fistulas) where pus  drains from a bump.  Pain during daily activities, such as pain during walking if your groin area is affected.  Emotional problems, such as stress or depression. This condition may affect your appearance and your ability or willingness to wear certain clothes or do certain activities. How is this diagnosed? This condition is diagnosed by a health care provider who specializes in skin diseases (dermatologist). You may be diagnosed based on:  Your symptoms and medical history.  A physical exam.  Testing a pus sample for infection.  Blood tests. How is this treated? Your treatment will depend on how severe your symptoms are. The same treatment will not work for everybody with this condition. You may need to try  several treatments to find what works best for you. Treatment may include:  Cleaning and bandaging (dressing) your wounds as needed.  Lifestyle changes, such as new skin care routines.  Taking medicines, such as: ? Antibiotics. ? Acne medicines. ? Medicines to reduce the activity of the immune system. ? A diabetes medicine (metformin). ? Birth control pills, for women. ? Steroids to reduce swelling and pain.  Working with a mental health care provider, if you experience emotional distress due to this condition. If you have severe symptoms that do not get better with medicine, you may need surgery. Surgery may involve:  Using a laser to clear the skin and remove hair follicles.  Opening and draining deep sores.  Removing the areas of skin that are diseased and scarred. Follow these instructions at home: Medicines   Take over-the-counter and prescription medicines only as told by your health care provider.  If you were prescribed an antibiotic medicine, take it as told by your health care provider. Do not stop taking the antibiotic even if your condition improves. Skin care  If you have open wounds, cover them with a clean dressing as told by your health care provider. Keep wounds clean by washing them gently with soap and water when you bathe.  Do not shave the areas where you get hidradenitis suppurativa.  Do not wear deodorant.  Wear loose-fitting clothes.  Try to avoid getting overheated or sweaty. If you get sweaty or wet, change into clean, dry clothes as soon as you can.  To help relieve pain and itchiness, cover sore areas with a warm, clean washcloth (warm compress) for 5-10 minutes as often as needed.  If told by your health care provider, take a bleach bath twice a week: ? Fill your bathtub halfway with water. ? Pour in  cup of unscented household bleach. ? Soak in the tub for 5-10 minutes. ? Only soak from the neck down. Avoid water on your face and hair. ?  Shower to rinse off the bleach from your skin. General instructions  Learn as much as you can about your disease so that you have an active role in your treatment. Work closely with your health care provider to find treatments that work for you.  If you are overweight, work with your health care provider to lose weight as recommended.  Do not use any products that contain nicotine or tobacco, such as cigarettes and e-cigarettes. If you need help quitting, ask your health care provider.  If you struggle with living with this condition, talk with your health care provider or work with a mental health care provider as recommended.  Keep all follow-up visits as told by your health care provider. This is important. Where to find more information  Hidradenitis [place].:  [email or web address]://www.hs-foundation.org/ Contact a health care provider if you have:  A flare-up of hidradenitis suppurativa.  A fever or chills.  Trouble controlling your symptoms at home.  Trouble doing your daily activities because of your symptoms.  Trouble dealing with emotional problems related to your condition. Summary  Hidradenitis suppurativa is a long-term (chronic) skin disease. It is similar to a severe form of acne, but it affects areas of the body where acne would be unusual.  The first symptoms are usually painful bumps in the skin, similar to pimples. The condition may get worse over time (progress), or it may only cause mild symptoms.  If you have open wounds, cover them with a clean dressing as told by your health care provider. Keep wounds clean by washing them gently with soap and water when you bathe.  Besides skin care, treatment may include medicines, laser treatment, and surgery. This information is not intended to replace advice given to you by your health care provider. Make sure you discuss any questions you have with your health care provider. Document Released: 10/09/2003 Document  Revised: 03/04/2017 Document Reviewed: 03/04/2017 Elsevier Patient Education  2020 [employer].

## 2018-10-14 NOTE — Assessment & Plan Note (Signed)
To be placed on Megace--low dose prior to procedure--no estrogen, should be ok for 2-4 wks.

## 2018-10-15 LAB — COMPREHENSIVE METABOLIC PANEL
ALT: 37 IU/L — ABNORMAL HIGH (ref 0–32)
AST: 20 IU/L (ref 0–40)
Albumin/Globulin Ratio: 1.4 (ref 1.2–2.2)
Albumin: 4.2 g/dL (ref 3.8–4.8)
Alkaline Phosphatase: 83 IU/L (ref 39–117)
BUN/Creatinine Ratio: 13 (ref 9–23)
BUN: 10 mg/dL (ref 6–24)
Bilirubin Total: 0.2 mg/dL (ref 0.0–1.2)
CO2: 22 mmol/L (ref 20–29)
Calcium: 9.1 mg/dL (ref 8.7–10.2)
Chloride: 103 mmol/L (ref 96–106)
Creatinine, Ser: 0.8 mg/dL (ref 0.57–1.00)
GFR calc Af Amer: 107 mL/min/{1.73_m2} (ref >59)
GFR calc non Af Amer: 93 mL/min/{1.73_m2} (ref >59)
Globulin, Total: 3 g/dL (ref 1.5–4.5)
Glucose: 96 mg/dL (ref 65–99)
Potassium: 4.1 mmol/L (ref 3.5–5.2)
Sodium: 138 mmol/L (ref 134–144)
Total Protein: 7.2 g/dL (ref 6.0–8.5)

## 2018-10-15 LAB — CBC
Hematocrit: 37.9 % (ref 34.0–46.6)
Hemoglobin: 12.2 g/dL (ref 11.1–15.9)
MCH: 25.5 pg — ABNORMAL LOW (ref 26.6–33.0)
MCHC: 32.2 g/dL (ref 31.5–35.7)
MCV: 79 fL (ref 79–97)
Platelets: 357 10*3/uL (ref 150–450)
RBC: 4.79 x10E6/uL (ref 3.77–5.28)
RDW: 13.2 % (ref 11.7–15.4)
WBC: 8.7 10*3/uL (ref 3.4–10.8)

## 2018-10-15 LAB — TSH: TSH: 1.74 u[IU]/mL (ref 0.450–4.500)

## 2018-10-15 LAB — LIPID PANEL
Chol/HDL Ratio: 3.2 ratio (ref 0.0–4.4)
Cholesterol, Total: 205 mg/dL — ABNORMAL HIGH (ref 100–199)
HDL: 64 mg/dL (ref >39)
LDL Calculated: 113 mg/dL — ABNORMAL HIGH (ref 0–99)
Triglycerides: 141 mg/dL (ref 0–149)
VLDL Cholesterol Cal: 28 mg/dL (ref 5–40)

## 2018-10-15 LAB — HEMOGLOBIN A1C
Est. average glucose Bld gHb Est-mCnc: 114 mg/dL
Hgb A1c MFr Bld: 5.6 % (ref 4.8–5.6)

## 2018-10-19 LAB — CYTOLOGY - PAP
Diagnosis: NEGATIVE
HPV: NOT DETECTED

## 2018-12-07 ENCOUNTER — Encounter: Payer: Self-pay | Admitting: Internal Medicine

## 2018-12-07 ENCOUNTER — Ambulatory Visit: Payer: BC Managed Care – PPO | Admitting: Internal Medicine

## 2018-12-07 ENCOUNTER — Other Ambulatory Visit: Payer: Self-pay

## 2018-12-07 VITALS — BP 112/78 | HR 77 | Temp 98.7°F | Wt 240.0 lb

## 2018-12-07 DIAGNOSIS — R140 Abdominal distension (gaseous): Secondary | ICD-10-CM | POA: Diagnosis not present

## 2018-12-07 DIAGNOSIS — R112 Nausea with vomiting, unspecified: Secondary | ICD-10-CM | POA: Diagnosis not present

## 2018-12-07 DIAGNOSIS — R197 Diarrhea, unspecified: Secondary | ICD-10-CM

## 2018-12-07 DIAGNOSIS — R1011 Right upper quadrant pain: Secondary | ICD-10-CM | POA: Diagnosis not present

## 2018-12-07 LAB — COMPREHENSIVE METABOLIC PANEL
ALT: 29 U/L (ref 0–35)
AST: 15 U/L (ref 0–37)
Albumin: 3.7 g/dL (ref 3.5–5.2)
Alkaline Phosphatase: 77 U/L (ref 39–117)
BUN: 8 mg/dL (ref 6–23)
CO2: 26 mEq/L (ref 19–32)
Calcium: 8.7 mg/dL (ref 8.4–10.5)
Chloride: 105 mEq/L (ref 96–112)
Creatinine, Ser: 0.68 mg/dL (ref 0.40–1.20)
GFR: 95.42 mL/min (ref >60.00)
Glucose, Bld: 92 mg/dL (ref 70–99)
Potassium: 4.3 mEq/L (ref 3.5–5.1)
Sodium: 137 mEq/L (ref 135–145)
Total Bilirubin: 0.4 mg/dL (ref 0.2–1.2)
Total Protein: 6.5 g/dL (ref 6.0–8.3)

## 2018-12-07 LAB — AMYLASE: Amylase: 20 U/L — ABNORMAL LOW (ref 27–131)

## 2018-12-07 LAB — CBC
HCT: 38.3 % (ref 36.0–46.0)
Hemoglobin: 12.3 g/dL (ref 12.0–15.0)
MCHC: 32.1 g/dL (ref 30.0–36.0)
MCV: 80.1 fl (ref 78.0–100.0)
Platelets: 293 10*3/uL (ref 150.0–400.0)
RBC: 4.78 Mil/uL (ref 3.87–5.11)
RDW: 14.9 % (ref 11.5–15.5)
WBC: 9 10*3/uL (ref 4.0–10.5)

## 2018-12-07 LAB — H. PYLORI ANTIBODY, IGG: H Pylori IgG: NEGATIVE

## 2018-12-07 LAB — LIPASE: Lipase: 14 U/L (ref 11.0–59.0)

## 2018-12-07 NOTE — Patient Instructions (Signed)
Abdominal Pain, Adult    Many things can cause belly (abdominal) pain. Most times, belly pain is not dangerous. Many cases of belly pain can be watched and treated at home. Sometimes belly pain is serious, though. Your doctor will try to find the cause of your belly pain.  Follow these instructions at home:  · Take over-the-counter and prescription medicines only as told by your doctor. Do not take medicines that help you poop (laxatives) unless told to by your doctor.  · Drink enough fluid to keep your pee (urine) clear or pale yellow.  · Watch your belly pain for any changes.  · Keep all follow-up visits as told by your doctor. This is important.  Contact a doctor if:  · Your belly pain changes or gets worse.  · You are not hungry, or you lose weight without trying.  · You are having trouble pooping (constipated) or have watery poop (diarrhea) for more than 2-3 days.  · You have pain when you pee or poop.  · Your belly pain wakes you up at night.  · Your pain gets worse with meals, after eating, or with certain foods.  · You are throwing up and cannot keep anything down.  · You have a fever.  Get help right away if:  · Your pain does not go away as soon as your doctor says it should.  · You cannot stop throwing up.  · Your pain is only in areas of your belly, such as the right side or the left lower part of the belly.  · You have bloody or black poop, or poop that looks like tar.  · You have very bad pain, cramping, or bloating in your belly.  · You have signs of not having enough fluid or water in your body (dehydration), such as:  ? Dark pee, very little pee, or no pee.  ? Cracked lips.  ? Dry mouth.  ? Sunken eyes.  ? Sleepiness.  ? Weakness.  This information is not intended to replace advice given to you by your health care provider. Make sure you discuss any questions you have with your health care provider.  Document Released: 08/13/2007 Document Revised: 09/14/2015 Document Reviewed: 08/08/2015  Elsevier  Interactive Patient Education © 2020 [employer]

## 2018-12-07 NOTE — Progress Notes (Signed)
Subjective:    Patient ID: Ariana Mcdaniel, female    DOB: 1975/05/17, 43 y.o.   MRN: [id number]  HPI  Pt presents to the clinic today with c/o RUQ abdominal pain. This started 1-2 months ago. It is intermittent. She describes the pain as. The pain radiates into the middle of her back. The pain is worse after eating. She reports associated nausea, vomiting x 1, intermittent diarrhea, abdomin bloating and chills. She denies recent changes in diet or medications. She has never had a gallbladder problem. She has a history of GERD, and is taking Omeprazole daily. She has not taken any medications OTC.   Review of Systems  Past Medical History:  Diagnosis Date  . Allergic rhinitis due to pollen   . DVT (deep venous thrombosis) ([place]) 01/2017  . Mastodynia   . Renal stones     Current Outpatient Medications  Medication Sig Dispense Refill  . chlorhexidine (HIBICLENS) 4 % external liquid Apply topically daily as needed. 120 mL 0  . megestrol (MEGACE) 40 MG tablet Take 1 tablet (40 mg total) by mouth 2 (two) times daily. 60 tablet 1  . metoprolol tartrate (LOPRESSOR) 25 MG tablet Take 0.5 tablets (12.5 mg total) by mouth daily as needed. (Patient not taking: Reported on 10/14/2018) 30 tablet 0   No current facility-administered medications for this visit.     Allergies  Allergen Reactions  . Amoxicillin     itching  . Clindamycin/Lincomycin     Caused a superinfection  . Oxycodone-Acetaminophen Hives    Family History  Problem Relation Age of Onset  . Hypertension Maternal Grandmother   . Diabetes Maternal Grandmother   . Depression Paternal Grandmother   . Cancer Paternal Grandfather        non-hodgkin's lymphoma  . Heart disease Neg Hx     Social History   Socioeconomic History  . Marital status: Married    Spouse name: Not on file  . Number of children: 2  . Years of education: Not on file  . Highest education level: Not on file  Occupational History  . Occupation:  Engineer, civil (consulting): [employer]  . Financial resource strain: Not on file  . Food insecurity    Worry: Not on file    Inability: Not on file  . Transportation needs    Medical: Not on file    Non-medical: Not on file  Tobacco Use  . Smoking status: Former Smoker    Quit date: 01/04/2010    Years since quitting: 8.9  . Smokeless tobacco: Never Used  Substance and Sexual Activity  . Alcohol use: Yes    Alcohol/week: 2.0 standard drinks    Types: 2 Cans of beer per week  . Drug use: No  . Sexual activity: Yes    Partners: Male    Birth control/protection: None    Comment: Vasectomy  Lifestyle  . Physical activity    Days per week: Not on file    Minutes per session: Not on file  . Stress: Not on file  Relationships  . Social Herbalist on phone: Not on file    Gets together: Not on file    Attends religious service: Not on file    Active member of club or organization: Not on file    Attends meetings of clubs or organizations: Not on file    Relationship status: Not on file  . Intimate partner violence  Fear of current or ex partner: Not on file    Emotionally abused: Not on file    Physically abused: Not on file    Forced sexual activity: Not on file  Other Topics Concern  . Not on file  Social History Narrative   2nd marriage     Constitutional: Denies fever, malaise, fatigue, headache or abrupt weight changes.  Respiratory: Denies difficulty breathing, shortness of breath, cough or sputum production.   Cardiovascular: Denies chest pain, chest tightness, palpitations or swelling in the hands or feet.  Gastrointestinal: Pt reports abdominal pain, bloating, nausea, vomiting, and diarrhea. Denies constipation, or blood in the stool.  GU: Denies urgency, frequency, pain with urination, burning sensation, blood in urine, odor or discharge.   No other specific complaints in a complete review of systems (except as listed in HPI  above).     Objective:   Physical Exam  BP 112/78   Pulse 77   Temp 98.7 F (37.1 C)   Wt 240 lb (108.9 kg)   LMP 11/11/2018   SpO2 98%   BMI 43.90 kg/m  Wt Readings from Last 3 Encounters:  12/07/18 240 lb (108.9 kg)  10/14/18 243 lb (110.2 kg)  08/27/17 239 lb 4 oz (108.5 kg)    General: Appears her stated age, obese, in NAD. Cardiovascular: Normal rate and rhythm. S1,S2 noted.  No murmur, rubs or gallops noted.  Pulmonary/Chest: Normal effort and positive vesicular breath sounds. No respiratory distress. No wheezes, rales or ronchi noted.  Abdomen: Soft and mildly tender in the RUQ but not Murphy's sign. Normal bowel sounds. No distention or masses noted. Liver, spleen and kidneys non palpable. Neurological: Alert and oriented.   BMET    Component Value Date/Time   NA 138 10/14/2018 1458   K 4.1 10/14/2018 1458   CL 103 10/14/2018 1458   CO2 22 10/14/2018 1458   GLUCOSE 96 10/14/2018 1458   GLUCOSE 83 08/27/2017 1534   BUN 10 10/14/2018 1458   CREATININE 0.80 10/14/2018 1458   CREATININE 0.73 03/28/2015 1738   CALCIUM 9.1 10/14/2018 1458   GFRNONAA 93 10/14/2018 1458   GFRAA 107 10/14/2018 1458    Lipid Panel     Component Value Date/Time   CHOL 205 (H) 10/14/2018 1458   TRIG 141 10/14/2018 1458   HDL 64 10/14/2018 1458   CHOLHDL 3.2 10/14/2018 1458   CHOLHDL 3.3 08/20/2012 0921   VLDL 20 08/20/2012 0921   LDLCALC 113 (H) 10/14/2018 1458    CBC    Component Value Date/Time   WBC 8.7 10/14/2018 1458   WBC 8.7 08/27/2017 1534   RBC 4.79 10/14/2018 1458   RBC 4.58 08/27/2017 1534   HGB 12.2 10/14/2018 1458   HCT 37.9 10/14/2018 1458   PLT 357 10/14/2018 1458   MCV 79 10/14/2018 1458   MCH 25.5 (L) 10/14/2018 1458   MCH 28.0 03/28/2015 1741   MCH 28.8 07/12/2012 1550   MCHC 32.2 10/14/2018 1458   MCHC 32.8 08/27/2017 1534   RDW 13.2 10/14/2018 1458   LYMPHSABS 2.4 08/27/2017 1534   MONOABS 0.6 08/27/2017 1534   EOSABS 0.1 08/27/2017 1534    BASOSABS 0.1 08/27/2017 1534    Hgb A1C Lab Results  Component Value Date   HGBA1C 5.6 10/14/2018            Assessment & Plan:   RUQ Pain, Abdominal Bloating, Nausea, Vomiting, Diarrhea:  Will check CBC, CMET, Amylase, Lipase and H Pylori Continue Omeprazole for now  Avoid fried and fatty foods Can take Tylenol OTC as needed for pain Consider RUQ ultrasound pending labs  Return precautions discussed Webb Silversmith, NP

## 2018-12-10 ENCOUNTER — Encounter: Payer: Self-pay | Admitting: Internal Medicine

## 2018-12-10 NOTE — Addendum Note (Signed)
Addended by: Jearld Fenton on: 12/10/2018 02:01 PM   Modules accepted: Orders

## 2018-12-15 ENCOUNTER — Other Ambulatory Visit: Payer: Self-pay

## 2018-12-15 ENCOUNTER — Ambulatory Visit
Admission: RE | Admit: 2018-12-15 | Discharge: 2018-12-15 | Disposition: A | Discharge status: Home / Self Care | Payer: BC Managed Care – PPO | Source: Ambulatory Visit | Attending: Family Medicine | Admitting: Family Medicine

## 2018-12-15 DIAGNOSIS — Z01419 Encounter for gynecological examination (general) (routine) without abnormal findings: Secondary | ICD-10-CM

## 2018-12-16 ENCOUNTER — Other Ambulatory Visit: Payer: Self-pay | Admitting: Family Medicine

## 2018-12-16 DIAGNOSIS — R928 Other abnormal and inconclusive findings on diagnostic imaging of breast: Secondary | ICD-10-CM

## 2018-12-17 ENCOUNTER — Ambulatory Visit
Admission: RE | Admit: 2018-12-17 | Discharge: 2018-12-17 | Disposition: A | Discharge status: Home / Self Care | Payer: BC Managed Care – PPO | Source: Ambulatory Visit | Attending: Internal Medicine | Admitting: Internal Medicine

## 2018-12-17 DIAGNOSIS — R140 Abdominal distension (gaseous): Secondary | ICD-10-CM

## 2018-12-17 DIAGNOSIS — R1011 Right upper quadrant pain: Secondary | ICD-10-CM

## 2018-12-17 DIAGNOSIS — R197 Diarrhea, unspecified: Secondary | ICD-10-CM

## 2018-12-17 DIAGNOSIS — R112 Nausea with vomiting, unspecified: Secondary | ICD-10-CM

## 2018-12-20 ENCOUNTER — Encounter: Payer: Self-pay | Admitting: Internal Medicine

## 2018-12-21 ENCOUNTER — Ambulatory Visit
Admission: RE | Admit: 2018-12-21 | Discharge: 2018-12-21 | Disposition: A | Discharge status: Home / Self Care | Payer: BC Managed Care – PPO | Source: Ambulatory Visit | Attending: Family Medicine | Admitting: Family Medicine

## 2018-12-21 ENCOUNTER — Other Ambulatory Visit: Payer: Self-pay | Admitting: Family Medicine

## 2018-12-21 ENCOUNTER — Other Ambulatory Visit: Payer: Self-pay

## 2018-12-21 DIAGNOSIS — R928 Other abnormal and inconclusive findings on diagnostic imaging of breast: Secondary | ICD-10-CM

## 2018-12-21 DIAGNOSIS — N6310 Unspecified lump in the right breast, unspecified quadrant: Secondary | ICD-10-CM

## 2019-01-05 ENCOUNTER — Encounter (HOSPITAL_BASED_OUTPATIENT_CLINIC_OR_DEPARTMENT_OTHER): Payer: Self-pay

## 2019-01-05 ENCOUNTER — Ambulatory Visit (HOSPITAL_BASED_OUTPATIENT_CLINIC_OR_DEPARTMENT_OTHER): Admit: 2019-01-05 | Payer: BC Managed Care – PPO | Admitting: Family Medicine

## 2019-01-05 SURGERY — DILATATION & CURETTAGE/HYSTEROSCOPY WITH HYDROTHERMAL ABLATION
Anesthesia: Choice

## 2019-02-16 ENCOUNTER — Encounter: Payer: Self-pay | Admitting: Internal Medicine

## 2019-02-16 ENCOUNTER — Ambulatory Visit (INDEPENDENT_AMBULATORY_CARE_PROVIDER_SITE_OTHER): Payer: BC Managed Care – PPO | Admitting: Internal Medicine

## 2019-02-16 ENCOUNTER — Other Ambulatory Visit: Payer: Self-pay

## 2019-02-16 DIAGNOSIS — J0140 Acute pansinusitis, unspecified: Secondary | ICD-10-CM

## 2019-02-16 MED ORDER — DOXYCYCLINE HYCLATE 100 MG PO TABS: 100.0000 mg | ORAL_TABLET | Freq: Two times a day (BID) | ORAL | 1 refills | Status: DC

## 2019-02-16 NOTE — Assessment & Plan Note (Signed)
Has had 3 weeks of classic sinus symptoms No clear COVID exposure--but if she had it, it started 3 weeks ago No systemic symptoms Discussed supportive care Will try doxy---if ineffective, will change to azithromycin She will continue to stay safe with masking, etc

## 2019-02-16 NOTE — Progress Notes (Signed)
Subjective:    Patient ID: Ariana Mcdaniel, female    DOB: 29-Jan-1976, 43 y.o.   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 megestrol (MEGACE) 40 MG tablet Take 1 tablet (40 mg total) by mouth 2 (two) times daily. (Patient not taking: Reported on 02/16/2019) 60 tablet 1  . omeprazole (PRILOSEC OTC) 20 MG tablet Take 20 mg by mouth daily.     No current facility-administered medications on file prior to visit.     Allergies  Allergen Reactions  . Amoxicillin     itching  . Clindamycin/Lincomycin     Caused a superinfection  . Oxycodone-Acetaminophen Hives    Past Medical History:  Diagnosis Date  . Allergic rhinitis due to pollen   . DVT (deep venous thrombosis) ([place]) 01/2017  . Mastodynia   . Renal stones     Past Surgical History:  Procedure Laterality Date  . TONSILLECTOMY    . WISDOM TOOTH EXTRACTION      Family History  Problem Relation Age of Onset  . Hypertension Maternal Grandmother   . Diabetes Maternal Grandmother   . Depression Paternal Grandmother   . Cancer Paternal Grandfather        non-hodgkin's lymphoma  . Heart disease Neg Hx     Social History   Socioeconomic History  . Marital status: Married    Spouse name: Not on file  . Number of children: 2  . Years of education: Not on file  . Highest education level: Not on file  Occupational History  . Occupation: Engineer, civil (consulting): [employer]  . Financial resource strain: Not on file  . Food insecurity    Worry: Not on file    Inability: Not on file  . Transportation needs    Medical: Not on file    Non-medical: Not on file  Tobacco Use  . Smoking status: Former Smoker    Quit date: 01/04/2010    Years since quitting: 9.1  . Smokeless tobacco: Never Used  Substance and Sexual Activity  . Alcohol use: Yes    Alcohol/week: 2.0 standard drinks    Types: 2 Cans of beer per week  . Drug use: No  . Sexual activity: Yes    Partners: Male    Birth control/protection: None    Comment: Vasectomy  Lifestyle  . Physical activity    Days per week: Not on file    Minutes per session: Not on file  . Stress: Not on file  Relationships  . Social Herbalist on phone: Not on file    Gets together: Not on file    Attends religious service: Not on file    Active member of club or organization: Not on file    Attends meetings of clubs or organizations: Not on file    Relationship status: Not on file  . Intimate partner violence    Fear of current or ex  partner: Not on file    Emotionally abused: Not on file    Physically abused: Not on file    Forced sexual activity: Not on file  Other Topics Concern  . Not on file  Social History Narrative   2nd marriage   Review of Systems No loss of taste or smell Slight nausea briefly No vomiting Eating okay No diarrhea    Objective:   Physical Exam  Constitutional: She appears well-developed. No distress.  Respiratory: Effort normal. No respiratory distress.  Psychiatric: She has a normal mood and affect. Her behavior is normal.           Assessment & Plan:

## 2019-03-21 ENCOUNTER — Ambulatory Visit: Payer: BC Managed Care – PPO | Attending: Internal Medicine

## 2019-03-21 DIAGNOSIS — Z20822 Contact with and (suspected) exposure to covid-19: Secondary | ICD-10-CM

## 2019-03-22 LAB — NOVEL CORONAVIRUS, NAA: SARS-CoV-2, NAA: NOT DETECTED

## 2019-04-12 ENCOUNTER — Encounter (INDEPENDENT_AMBULATORY_CARE_PROVIDER_SITE_OTHER): Payer: Self-pay

## 2019-04-15 ENCOUNTER — Telehealth: Payer: Self-pay

## 2019-04-15 ENCOUNTER — Encounter: Payer: Self-pay | Admitting: Family Medicine

## 2019-04-15 ENCOUNTER — Ambulatory Visit
Admission: RE | Admit: 2019-04-15 | Discharge: 2019-04-15 | Disposition: A | Discharge status: Home / Self Care | Payer: BC Managed Care – PPO | Source: Ambulatory Visit | Attending: Family Medicine | Admitting: Family Medicine

## 2019-04-15 ENCOUNTER — Ambulatory Visit: Payer: BC Managed Care – PPO | Admitting: Family Medicine

## 2019-04-15 ENCOUNTER — Other Ambulatory Visit: Payer: Self-pay

## 2019-04-15 VITALS — BP 128/70 | HR 105 | Temp 98.5°F | Ht 62.0 in | Wt 244.4 lb

## 2019-04-15 DIAGNOSIS — Z86718 Personal history of other venous thrombosis and embolism: Secondary | ICD-10-CM | POA: Diagnosis not present

## 2019-04-15 DIAGNOSIS — M79662 Pain in left lower leg: Secondary | ICD-10-CM

## 2019-04-15 NOTE — Telephone Encounter (Signed)
[facility] - Client TELEPHONE ADVICE RECORD [facility] Patient Name: Ariana Mcdaniel Gender: Female DOB: 1976-01-13 Age: 44 Y 2 M 13 D Return Phone Number: [phone] (Primary) Address: City/State/Zip: [place] [address] Client [facility] - Client Client Site [facility] - Day Physician Viviana Simpler - MD Contact Type Call Who Is Calling Patient / Member / Family / Caregiver Call Type Triage / Clinical Relationship To Patient Self Return Phone Number [phone] (Primary) Chief Complaint Leg Pain Reason for Call Symptomatic / Request for [place] says that she has a HX of DVTS. Her left calf is swollen and warm to the touch. Per office; they made an appt. for PT at 11:15 today. Translation No Nurse Assessment Nurse: Raphael Gibney, RN, Vera Date/Time (Eastern Time): 04/15/2019 9:50:11 AM Confirm and document reason for call. If symptomatic, describe symptoms. ---Caller states she has history of DVTs. her left calf is swollen and has pain from her knee to her foot. Pt has appt today at 11:15 am. Has the patient had close contact with a person known or suspected to have the novel coronavirus illness OR traveled / lives in area with major community spread (including international travel) in the last 14 days from the onset of symptoms? * If Asymptomatic, screen for exposure and travel within the last 14 days. ---No Does the patient have any new or worsening symptoms? ---Yes Will a triage be completed? ---Yes Related visit to physician within the last 2 weeks? ---No Does the PT have any chronic conditions? (i.e. diabetes, asthma, this includes High risk factors for pregnancy, etc.) ---Yes List chronic conditions. ---DVT Is the patient pregnant or possibly pregnant? (Ask all females between the ages of 22-55) ---No Is this a behavioral health or substance abuse call?  ---No Guidelines Guideline Title Affirmed Question Affirmed Notes Nurse Date/Time Eilene Ghazi Time) Leg Pain [1] Thigh or calf pain AND [2] only 1 side AND [3] present > 1 Stringer, RN, Vanita Ingles 04/15/2019 9:51:34 AM PLEASE NOTE: All timestamps contained within this report are represented as [place] Standard Time. CONFIDENTIALTY NOTICE: This fax transmission is intended only for the addressee. It contains information that is legally privileged, confidential or otherwise protected from use or disclosure. If you are not the intended recipient, you are strictly prohibited from reviewing, disclosing, copying using or disseminating any of this information or taking any action in reliance on or regarding this information. If you have received this fax in error, please notify us immediately by telephone so that we can arrange for its return to [place]. Phone: [phone], Toll-Free: [phone], Fax: [phone] Page: 2 of 2 Call Id: [id number] Guidelines Guideline Title Affirmed Question Affirmed Notes Nurse Date/Time Eilene Ghazi Time) hour (Exception: chronic unchanged pain) Disp. Time Eilene Ghazi Time) Disposition Final User 04/15/2019 9:53:21 AM See HCP within 4 Hours (or PCP triage) Yes Raphael Gibney, RN, Doreatha Lew Disagree/Comply Comply Caller Understands Yes PreDisposition [place] Advice Given Per Guideline SEE HCP WITHIN 4 HOURS (OR PCP TRIAGE): * IF OFFICE WILL BE OPEN: You need to be seen within the next 3 or 4 hours. Call your doctor (or NP/PA) now or as soon as the office opens. CALL EMS IF: You develop any chest pain or shortness of breath. CARE ADVICE given per Leg Pain (Adult) guideline. Referrals REFERRED TO PCP OFFICE

## 2019-04-15 NOTE — Telephone Encounter (Signed)
Per chart review tab pt has alaready seen Glenda Chroman FNP earlier today. FYI to Glenda Chroman FNP.

## 2019-04-15 NOTE — Telephone Encounter (Signed)
Noted  

## 2019-04-15 NOTE — Patient Instructions (Signed)
Good to see you today!  

## 2019-04-15 NOTE — Progress Notes (Signed)
   Subjective:    Patient ID: Ariana Mcdaniel, female    DOB: 1975-12-10, 44 y.o.   MRN: [id number]  HPI Chief Complaint  Patient presents with  . Leg Pain    Pt c/o left calf pain, swelling, "fullness feeling" and a hot sensation to her leg. Pt thinks that she may have another blood clot. Pt states that she had a blood clot in the same leg about 2 years ago.    This is a 44 yo female who presents today for above cc. Has history of DVT 2019 that feels the same as her leg feels today. She denies any recent travel, no OCPs.  No chest pain, no SOB.    Review of Systems Per HPI    Objective:   Physical Exam Vitals reviewed.  Constitutional:      General: She is not in acute distress.    Appearance: Normal appearance. She is obese. She is not ill-appearing, toxic-appearing or diaphoretic.  HENT:     Head: Normocephalic and atraumatic.  Eyes:     Conjunctiva/sclera: Conjunctivae normal.  Cardiovascular:     Rate and Rhythm: Normal rate.  Pulmonary:     Effort: Pulmonary effort is normal.  Musculoskeletal:        General: Tenderness (left calf) present.  Skin:    General: Skin is warm and dry.  Neurological:     Mental Status: She is alert.       BP 128/70 (BP Location: Left Arm, Patient Position: Sitting, Cuff Size: Large)   Pulse (!) 105   Temp 98.5 F (36.9 C) (Temporal)   Ht 5\' 2"  (1.575 m)   Wt 244 lb 6.4 oz (110.9 kg)   SpO2 96%   BMI 44.70 kg/m  Wt Readings from Last 3 Encounters:  04/15/19 244 lb 6.4 oz (110.9 kg)  02/16/19 240 lb (108.9 kg)  12/07/18 240 lb (108.9 kg)       Assessment & Plan:  1. Pain of left calf - given prior DVT history, will have her go for stat venous doppler - US Venous Img Lower Unilateral Left; Future  2. History of DVT (deep vein thrombosis) - US Venous Img Lower Unilateral Left; Future   Clarene Reamer, FNP-BC  [facility]  04/15/2019 12:01 PM

## 2019-04-20 ENCOUNTER — Encounter (INDEPENDENT_AMBULATORY_CARE_PROVIDER_SITE_OTHER): Payer: Self-pay | Admitting: Bariatrics

## 2019-04-20 ENCOUNTER — Ambulatory Visit (INDEPENDENT_AMBULATORY_CARE_PROVIDER_SITE_OTHER): Payer: BC Managed Care – PPO | Admitting: Bariatrics

## 2019-04-20 ENCOUNTER — Other Ambulatory Visit: Payer: Self-pay

## 2019-04-20 VITALS — BP 123/76 | HR 83 | Temp 98.4°F | Ht 62.0 in | Wt 239.0 lb

## 2019-04-20 DIAGNOSIS — Z9189 Other specified personal risk factors, not elsewhere classified: Secondary | ICD-10-CM

## 2019-04-20 DIAGNOSIS — F3289 Other specified depressive episodes: Secondary | ICD-10-CM

## 2019-04-20 DIAGNOSIS — R7309 Other abnormal glucose: Secondary | ICD-10-CM

## 2019-04-20 DIAGNOSIS — R0602 Shortness of breath: Secondary | ICD-10-CM | POA: Diagnosis not present

## 2019-04-20 DIAGNOSIS — K219 Gastro-esophageal reflux disease without esophagitis: Secondary | ICD-10-CM

## 2019-04-20 DIAGNOSIS — R5383 Other fatigue: Secondary | ICD-10-CM

## 2019-04-20 DIAGNOSIS — E66813 Obesity, class 3: Secondary | ICD-10-CM

## 2019-04-20 DIAGNOSIS — R002 Palpitations: Secondary | ICD-10-CM | POA: Diagnosis not present

## 2019-04-20 DIAGNOSIS — Z6841 Body Mass Index (BMI) 40.0 and over, adult: Secondary | ICD-10-CM

## 2019-04-20 DIAGNOSIS — K760 Fatty (change of) liver, not elsewhere classified: Secondary | ICD-10-CM

## 2019-04-20 DIAGNOSIS — Z0289 Encounter for other administrative examinations: Secondary | ICD-10-CM

## 2019-04-20 NOTE — Progress Notes (Signed)
Office: [phone]  /  Fax: [phone]    Date: May 02, 2019   Appointment Start Time: 3:02pm Duration: 28 minutes Provider: Glennie Isle, Psy.D. Type of Session: Intake for Individual Therapy  Location of Patient: Parked in car at work Location of Provider: Provider's Home Type of Contact: Telepsychological Visit via [employer]  Informed Consent: Prior to proceeding with today's appointment, two pieces of identifying information were obtained. In addition, Ariana Mcdaniel's physical location at the time of this appointment was obtained as well a phone number she could be reached at in the event of technical difficulties. Ariana Mcdaniel and this provider participated in today's telepsychological service.   The provider's role was explained to Ariana Mcdaniel. The provider reviewed and discussed issues of confidentiality, privacy, and limits therein (e.g., reporting obligations). In addition to verbal informed consent, written informed consent for psychological services was obtained prior to the initial appointment. Since the clinic is not a 24/7 crisis center, mental health emergency resources were shared and this  provider explained MyChart, e-mail, voicemail, and/or other messaging systems should be utilized only for non-emergency reasons. This provider also explained that information obtained during appointments will be placed in [facility] record and relevant information will be shared with other providers at [facility] for coordination of care. Moreover, Ariana Mcdaniel agreed information may be shared with other [facility] providers as needed for coordination of care. By signing the service agreement document, Ariana Mcdaniel provided written consent for coordination of care. Prior to initiating telepsychological services, Ariana Mcdaniel completed an informed consent document, which included the development of a safety plan (i.e., an emergency contact, nearest emergency room, and emergency  resources) in the event of an emergency/crisis. Ariana Mcdaniel expressed understanding of the rationale of the safety plan. Desiraye verbally acknowledged understanding she is ultimately responsible for understanding her insurance benefits for telepsychological and in-person services. This provider also reviewed confidentiality, as it relates to telepsychological services, as well as the rationale for telepsychological services (i.e., to reduce exposure risk to COVID-19). Ariana Mcdaniel  acknowledged understanding that appointments cannot be recorded without both party consent and she is aware she is responsible for securing confidentiality on her end of the session. Ariana Mcdaniel verbally consented to proceed.  Chief Complaint/HPI: Ariana Mcdaniel was referred by Dr. Jearld Lesch due to depression with emotional eating behaviors. Per the note for the initial visit with Dr. Jearld Lesch on April 20, 2019, "Ariana Mcdaniel is struggling with emotional eating and using food for comfort to the extent that it is negatively impacting her health. She has been working on behavior modification techniques to help reduce her emotional eating and has been somewhat successful. She shows no sign of suicidal or homicidal ideations." The note for the initial appointment with Dr. Owens Shark also indicated the following: "Her family eats meals together, she thinks her family will eat healthier with her, her desired weight loss is 79 lbs, she started gaining weight when she quit smoking at age 57, her heaviest weight ever was 245 pounds, she craves sugar and salt, she snacks frequently in the evenings, she skips mostly breakfast everyday, she is frequently drinking liquids with calories, she frequently makes poor food choices, she frequently eats larger portions than normal, she has binge eating behaviors and she struggles with emotional eating." Ariana Mcdaniel's Food and Mood (modified PHQ-9) score on April 20, 2019 was 16.  During today's appointment, Ariana Mcdaniel was verbally  administered a questionnaire assessing various behaviors related to emotional eating. Ariana Mcdaniel endorsed the following: overeat when you are  celebrating, experience food cravings on a regular basis, eat certain foods when you are anxious, stressed, depressed, or your feelings are hurt, find food is comforting to you, overeat when you are angry or upset, overeat when you are worried about something, overeat frequently when you are bored or lonely and not worry about what you eat when you are in a good mood. She shared she craves sugar (e.g., chocolate) and salty foods (e.g., chips). Ariana Mcdaniel believes the onset of emotional eating was likely when she quit smoking approximately 9.5 years ago. She described the frequency of emotional eating prior to starting with the clinic as "couple times a week." In addition, Ariana Mcdaniel denied a history of binge eating. Ariana Mcdaniel denied a history of restricting food intake, purging and engagement in other compensatory strategies, and has never been diagnosed with an eating disorder. She also denied a history of treatment for emotional eating. Moreover, Ariana Mcdaniel indicated trying to diet and not succeeding triggers emotional eating. She is unsure what makes emotional eating better. Furthermore, Ariana Mcdaniel denied other problems of concern.    Mental Status Examination:  Appearance: well groomed and appropriate hygiene  Behavior: appropriate to circumstances Mood: euthymic Affect: mood congruent Speech: normal in rate, volume, and tone Eye Contact: appropriate Psychomotor Activity: appropriate Gait: unable to assess Thought Process: linear, logical, and goal directed  Thought Content/Perception: denies suicidal and homicidal ideation, plan, and intent and no hallucinations, delusions, bizarre thinking or behavior reported or observed Orientation: time, person, place and purpose of appointment Memory/Concentration: memory, attention, language, and fund of knowledge intact  Insight/Judgment:  good  Family & Psychosocial History: Ariana Mcdaniel reported she is married and she has two biological children (ages 64 and 45) and one step-son (age 60). 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 Additionally, Ariana Mcdaniel shared her highest level of education obtained is a high school diploma. Currently, Ariana Mcdaniel's social support system consists of her husband, mother, and sisters. Moreover, Ariana Mcdaniel stated she resides with her husband and three children.   Medical History:  Past Medical History:  Diagnosis Date  . Allergic rhinitis due to pollen   . DVT (deep venous thrombosis) ([place]) 01/2017  . Fatty liver   . GERD (gastroesophageal reflux disease)   . Mastodynia   . Palpitations   . Renal stones    Past Surgical History:  Procedure Laterality Date  . TONSILLECTOMY    . WISDOM TOOTH EXTRACTION     Current Outpatient Medications on File Prior to Visit  Medication Sig Dispense Refill  . omeprazole (PRILOSEC OTC) 20 MG tablet Take 20 mg by mouth daily.     No current facility-administered medications on file prior to visit.  Marlita reported she suffered a concussion in [place]. She denied LOC and she received medical attention.   Mental Health History: Ariana Mcdaniel reported there is no history of therapeutic services. Ariana Mcdaniel reported there is no history of hospitalizations for psychiatric concerns, and she has never met with a psychiatrist. Ariana Mcdaniel stated she has never been prescribed psychotropic medications. Ariana Mcdaniel endorsed a family history of mental health related concerns. She reported a history of bipolar, depression, and schizophrenia on her father's side. Ariana Mcdaniel reported there is no history of trauma including psychological, physical  and sexual abuse, as well as neglect.   Ariana Mcdaniel described her typical mood lately as "great," noting the improvement in mood is secondary to starting with the clinic. Aside from concerns noted above and  endorsed  on the PHQ-9, Jacquette reported she is "scared of being diabetic" and suffered from other medical concerns. Larri denied current alcohol use, noting she consumed alcohol prior to starting with the clinic. She noted her last use was the weekend before starting with the clinic. She denied tobacco use. She denied illicit/recreational substance use. Aeralyn denied current caffeine intake. Furthermore, Harmonie indicated she is not experiencing the following: hopelessness, memory concerns, hallucinations and delusions, paranoia, symptoms of mania (e.g., expansive mood, flighty ideas, decreased need for sleep, engagement in risky behaviors), social withdrawal, crying spells, panic attacks and decreased motivation. She also denied history of and current suicidal ideation, plan, and intent; history of and current homicidal ideation, plan, and intent; and history of and current engagement in self-harm.  The following strengths were reported by Ariana Mcdaniel: passionate, good listener, and hard worker. The following strengths were observed by this provider: ability to express thoughts and feelings during the therapeutic session, ability to establish and benefit from a therapeutic relationship, willingness to work toward established goal(s) with the clinic and ability to engage in reciprocal conversation.  Legal History: Virdell reported there is no history of legal involvement.   Structured Assessments Results: The Patient Health Questionnaire-9 (PHQ-9) is a self-report measure that assesses symptoms and severity of depression over the course of the last two weeks. Jannifer obtained a score of 3 suggesting minimal depression. Belicia finds the endorsed symptoms to be not difficult at all. [0= Not at all; 1= Several days; 2= More than half the days; 3= Nearly every day] Little interest or pleasure in doing things 0  Feeling down, depressed, or hopeless 0  Trouble falling or staying asleep, or sleeping too much 3  Feeling  tired or having little energy 0  Poor appetite or overeating 0  Feeling bad about yourself --- or that you are a failure or have let yourself or your family down 0  Trouble concentrating on things, such as reading the newspaper or watching television 0  Moving or speaking so slowly that other people could have noticed? Or the opposite --- being so fidgety or restless that you have been moving around a lot more than usual 0  Thoughts that you would be better off dead or hurting yourself in some way 0  PHQ-9 Score 3    The Generalized Anxiety Disorder-7 (GAD-7) is a brief self-report measure that assesses symptoms of anxiety over the course of the last two weeks. Itati obtained a score of 0. [0= Not at all; 1= Several days; 2= Over half the days; 3= Nearly every day] Feeling nervous, anxious, on edge 0  Not being able to stop or control worrying 0  Worrying too much about different things 0  Trouble relaxing 0  Being so restless that it's hard to sit still 0  Becoming easily annoyed or irritable 0  Feeling afraid as if something awful might happen 0  GAD-7 Score 0   Interventions:  Conducted a chart review Focused on rapport building Verbally administered PHQ-9 and GAD-7 for symptom monitoring Verbally administered Food & Mood questionnaire to assess various behaviors related to emotional eating. Provided emphatic reflections and validation Psychoeducation provided regarding physical versus emotional hunger  Provisional DSM-5 Diagnosis: 311 (F32.8) Other Specified Depressive Disorder, Emotional Eating Behaviors  Plan:Arrayah declined future appointments with this provider. She acknowledged understanding that she may request a follow-up appointment with this provider in the future as long as she is still established with the clinic. Maciah will be sent a handout  via e-mail to increase awareness of hunger patterns and subsequent eating. Vernadette provided verbal consent during today's  appointment for this provider to send the handout via e-mail. No further follow-up planned by this provider.

## 2019-04-20 NOTE — Progress Notes (Signed)
Chief Complaint:   OBESITY Ariana Mcdaniel (MR# [id number]) is a 44 y.o. female who presents for evaluation and treatment of obesity and related comorbidities. Current BMI is Body mass index is 43.71 kg/m.Marland Kitchen Ariana Mcdaniel has been struggling with her weight for many years and has been unsuccessful in either losing weight, maintaining weight loss, or reaching her healthy weight goal.  Ariana Mcdaniel is currently in the action stage of change and ready to dedicate time achieving and maintaining a healthier weight. Ariana Mcdaniel is interested in becoming our patient and working on intensive lifestyle modifications including (but not limited to) diet and exercise for weight loss.   Ariana Mcdaniel likes to cook, but states her spouse is very picky and states she is also picky. She dislikes fish and most vegetables.  Ariana Mcdaniel's habits were reviewed today and are as follows: Her family eats meals together, she thinks her family will eat healthier with her, her desired weight loss is 79 lbs, she started gaining weight when she quit smoking at age 29, her heaviest weight ever was 245 pounds, she craves sugar and salt, she snacks frequently in the evenings, she skips mostly breakfast everyday, she is frequently drinking liquids with calories, she frequently makes poor food choices, she frequently eats larger portions than normal, she has binge eating behaviors and she struggles with emotional eating.  Depression Screen Ariana Mcdaniel's Food and Mood (modified PHQ-9) score was 16.  Depression screen PHQ 2/9 04/20/2019  Decreased Interest 1  Down, Depressed, Hopeless 3  PHQ - 2 Score 4  Altered sleeping 2  Tired, decreased energy 2  Change in appetite 2  Feeling bad or failure about yourself  3  Trouble concentrating 3  Moving slowly or fidgety/restless 0  Suicidal thoughts 0  PHQ-9 Score 16  Difficult doing work/chores Not difficult at all   Subjective:   Other fatigue. Ariana Mcdaniel denies daytime somnolence and admits to waking up still  tired. Ariana Mcdaniel generally gets 4-6 hours of sleep per night, and states that she does not sleep well most nights. Snoring is present. Apneic episodes are not present. Epworth Sleepiness Score is 5.  Shortness of breath on exertion. Ariana Mcdaniel notes increasing shortness of breath with some activities and seems to be worsening over time with weight gain. She notes getting out of breath sooner with activity than she used to. This has gotten worse recently. Ariana Mcdaniel denies shortness of breath at rest or orthopnea.  Gastroesophageal reflux disease, unspecified whether esophagitis present. Ariana Mcdaniel is taking Prilosec. GERD is controlled.  Palpitations. Ariana Mcdaniel is not currently undergoing a workup.   Fatty liver. Ariana Mcdaniel has not had an ultrasound. Last ALT 29 on 12/07/2018.  Elevated glucose. Ariana Mcdaniel reports polyphagia.  Other depression, with emotional eating. Ariana Mcdaniel is struggling with emotional eating and using food for comfort to the extent that it is negatively impacting her health. She has been working on behavior modification techniques to help reduce her emotional eating and has been somewhat successful. She shows no sign of suicidal or homicidal ideations.   At risk for diabetes mellitus. Ariana Mcdaniel is at higher than average risk for developing diabetes due to her obesity and elevated glucose.   Assessment/Plan:   Other fatigue. Ariana Mcdaniel does feel that her weight is causing her energy to be lower than it should be. Fatigue may be related to obesity, depression or many other causes. Labs will be ordered, and in the meanwhile, Ariana Mcdaniel will focus on self care including making healthy food choices, increasing physical activity and focusing on  stress reduction.   EKG 12-Lead, VITAMIN D 25 Hydroxy (Vit-D Deficiency, Fractures)  Shortness of breath on exertion. Ariana Mcdaniel does feel that she gets out of breath more easily that she used to when she exercises. Ariana Mcdaniel's shortness of breath appears to be obesity related and  exercise induced. She has agreed to work on weight loss and gradually increase exercise to treat her exercise induced shortness of breath. Will continue to monitor closely.  Gastroesophageal reflux disease, unspecified whether esophagitis present. Intensive lifestyle modifications are the first line treatment for this issue. We discussed several lifestyle modifications today and she will continue to work on diet, exercise and weight loss efforts. Orders and follow up as documented in patient record. Ariana Mcdaniel will continue Prilosec as directed.  Counseling . If a person has gastroesophageal reflux disease (GERD), food and stomach acid move back up into the esophagus and cause symptoms or problems such as damage to the esophagus. . Anti-reflux measures include: raising the head of the bed, avoiding tight clothing or belts, avoiding eating late at night, not lying down shortly after mealtime, and achieving weight loss. . Avoid ASA, NSAID's, caffeine, alcohol, and tobacco.  . OTC Pepcid and/or Tums are often very helpful for as needed use.  Marland Kitchen However, for persisting chronic or daily symptoms, stronger medications like Omeprazole may be needed. . You may need to avoid foods and drinks such as: ? Coffee and tea (with or without caffeine). ? Drinks that contain alcohol. ? Energy drinks and sports drinks. ? Bubbly (carbonated) drinks or sodas. ? Chocolate and cocoa. ? Peppermint and mint flavorings. ? Garlic and onions. ? Horseradish. ? Spicy and acidic foods. These include peppers, chili powder, curry powder, vinegar, hot sauces, and BBQ sauce. ? Citrus fruit juices and citrus fruits, such as oranges, lemons, and limes. ? Tomato-based foods. These include red sauce, chili, salsa, and pizza with red sauce. ? Fried and fatty foods. These include donuts, french fries, potato chips, and high-fat dressings. ? High-fat meats. These include hot dogs, rib eye steak, sausage, ham, and bacon.  Palpitations.  Ariana Mcdaniel will follow-up with her PCP.  Fatty liver. CMP ordered.  Elevated glucose. Fasting labs will be obtained and results with be discussed with Ariana Mcdaniel in 2 weeks at her follow up visit. In the meanwhile Maghan was started on a lower simple carbohydrate diet and will work on weight loss efforts. Hemoglobin A1c, Insulin, random, Comprehensive metabolic panel labs ordered.  Other depression, with emotional eating. Behavior modification techniques were discussed today to help Ariana Mcdaniel deal with her emotional/non-hunger eating behaviors.  Orders and follow up as documented in patient record. Ariana Mcdaniel will be referred to Dr. Mallie Mussel.   At risk for diabetes mellitus. Ariana Mcdaniel was given approximately 15 minutes of diabetes education and counseling today. We discussed intensive lifestyle modifications today with an emphasis on weight loss as well as increasing exercise and decreasing simple carbohydrates in her diet. We also reviewed medication options with an emphasis on risk versus benefit of those discussed.   Repetitive spaced learning was employed today to elicit superior memory formation and behavioral change.  Class 3 severe obesity with serious comorbidity and body mass index (BMI) of 40.0 to 44.9 in adult, unspecified obesity type ([place]).  Ariana Mcdaniel is currently in the action stage of change and her goal is to continue with weight loss efforts. I recommend Ariana Mcdaniel begin the structured treatment plan as follows:  She has agreed to the Category 4 Plan.  We independently reviewed labs with the patient  including CMP, CBC, 10/14/2018 A1c and thyroid.  She will work on meal planning and will decrease meal skipping.  Exercise goals: All adults should avoid inactivity. Some physical activity is better than none, and adults who participate in any amount of physical activity gain some health benefits.   Behavioral modification strategies: increasing lean protein intake, decreasing simple carbohydrates,  increasing vegetables, increasing water intake, decreasing eating out, no skipping meals, meal planning and cooking strategies, keeping healthy foods in the home and planning for success.  She was informed of the importance of frequent follow-up visits to maximize her success with intensive lifestyle modifications for her multiple health conditions. She was informed we would discuss her lab results at her next visit unless there is a critical issue that needs to be addressed sooner. Ariana Mcdaniel agreed to keep her next visit at the agreed upon time to discuss these results.  Objective:   Blood pressure 123/76, pulse 83, temperature 98.4 F (36.9 C), height 5\' 2"  (1.575 m), weight 239 lb (108.4 kg), last menstrual period 03/27/2019, SpO2 98 %. Body mass index is 43.71 kg/m.  EKG: Sinus  Rhythm with a rate of 74 BPM. Low voltage in precordial leads. Abnormal.   Indirect Calorimeter completed today shows a VO2 of 362 and a REE of 2519.  Her calculated basal metabolic rate is 123456 thus her basal metabolic rate is better than expected.  General: Cooperative, alert, well developed, in no acute distress. HEENT: Conjunctivae and lids unremarkable. Cardiovascular: Regular rhythm.  Lungs: Normal work of breathing. Neurologic: No focal deficits.   Lab Results  Component Value Date   CREATININE 0.68 12/07/2018   BUN 8 12/07/2018   NA 137 12/07/2018   K 4.3 12/07/2018   CL 105 12/07/2018   CO2 26 12/07/2018   Lab Results  Component Value Date   ALT 29 12/07/2018   AST 15 12/07/2018   ALKPHOS 77 12/07/2018   BILITOT 0.4 12/07/2018   Lab Results  Component Value Date   HGBA1C 5.6 10/14/2018   No results found for: INSULIN Lab Results  Component Value Date   TSH 1.740 10/14/2018   Lab Results  Component Value Date   CHOL 205 (H) 10/14/2018   HDL 64 10/14/2018   LDLCALC 113 (H) 10/14/2018   TRIG 141 10/14/2018   CHOLHDL 3.2 10/14/2018   Lab Results  Component Value Date   WBC 9.0  12/07/2018   HGB 12.3 12/07/2018   HCT 38.3 12/07/2018   MCV 80.1 12/07/2018   PLT 293.0 12/07/2018   No results found for: IRON, TIBC, FERRITIN  Attestation Statements:   Reviewed by clinician on day of visit: allergies, medications, problem list, medical history, surgical history, family history, social history, and previous encounter notes.  Migdalia Dk, am acting as Location manager for [employer], DO   I have reviewed the above documentation for accuracy and completeness, and I agree with the above. Jearld Lesch, DO

## 2019-04-21 ENCOUNTER — Encounter (INDEPENDENT_AMBULATORY_CARE_PROVIDER_SITE_OTHER): Payer: Self-pay | Admitting: Bariatrics

## 2019-04-21 DIAGNOSIS — E559 Vitamin D deficiency, unspecified: Secondary | ICD-10-CM | POA: Insufficient documentation

## 2019-04-21 DIAGNOSIS — R7303 Prediabetes: Secondary | ICD-10-CM | POA: Insufficient documentation

## 2019-04-21 LAB — COMPREHENSIVE METABOLIC PANEL
ALT: 28 IU/L (ref 0–32)
AST: 15 IU/L (ref 0–40)
Albumin/Globulin Ratio: 1.6 (ref 1.2–2.2)
Albumin: 4.3 g/dL (ref 3.8–4.8)
Alkaline Phosphatase: 98 IU/L (ref 39–117)
BUN/Creatinine Ratio: 20 (ref 9–23)
BUN: 15 mg/dL (ref 6–24)
Bilirubin Total: 0.3 mg/dL (ref 0.0–1.2)
CO2: 21 mmol/L (ref 20–29)
Calcium: 9.4 mg/dL (ref 8.7–10.2)
Chloride: 105 mmol/L (ref 96–106)
Creatinine, Ser: 0.74 mg/dL (ref 0.57–1.00)
GFR calc Af Amer: 116 mL/min/{1.73_m2} (ref >59)
GFR calc non Af Amer: 101 mL/min/{1.73_m2} (ref >59)
Globulin, Total: 2.7 g/dL (ref 1.5–4.5)
Glucose: 100 mg/dL — ABNORMAL HIGH (ref 65–99)
Potassium: 4.9 mmol/L (ref 3.5–5.2)
Sodium: 140 mmol/L (ref 134–144)
Total Protein: 7 g/dL (ref 6.0–8.5)

## 2019-04-21 LAB — HEMOGLOBIN A1C
Est. average glucose Bld gHb Est-mCnc: 117 mg/dL
Hgb A1c MFr Bld: 5.7 % — ABNORMAL HIGH (ref 4.8–5.6)

## 2019-04-21 LAB — INSULIN, RANDOM: INSULIN: 17.7 u[IU]/mL (ref 2.6–24.9)

## 2019-04-21 LAB — VITAMIN D 25 HYDROXY (VIT D DEFICIENCY, FRACTURES): Vit D, 25-Hydroxy: 19.3 ng/mL — ABNORMAL LOW (ref 30.0–100.0)

## 2019-04-21 NOTE — Telephone Encounter (Signed)
Please review

## 2019-05-02 ENCOUNTER — Other Ambulatory Visit: Payer: Self-pay

## 2019-05-02 ENCOUNTER — Ambulatory Visit (INDEPENDENT_AMBULATORY_CARE_PROVIDER_SITE_OTHER): Payer: BC Managed Care – PPO | Admitting: Psychology

## 2019-05-02 DIAGNOSIS — F5089 Other specified eating disorder: Secondary | ICD-10-CM

## 2019-05-02 DIAGNOSIS — F3289 Other specified depressive episodes: Secondary | ICD-10-CM | POA: Diagnosis not present

## 2019-05-04 ENCOUNTER — Other Ambulatory Visit: Payer: Self-pay

## 2019-05-04 ENCOUNTER — Encounter (INDEPENDENT_AMBULATORY_CARE_PROVIDER_SITE_OTHER): Payer: Self-pay | Admitting: Bariatrics

## 2019-05-04 ENCOUNTER — Ambulatory Visit (INDEPENDENT_AMBULATORY_CARE_PROVIDER_SITE_OTHER): Payer: BC Managed Care – PPO | Admitting: Bariatrics

## 2019-05-04 VITALS — BP 105/70 | HR 89 | Temp 98.8°F | Ht 62.0 in | Wt 236.0 lb

## 2019-05-04 DIAGNOSIS — K5909 Other constipation: Secondary | ICD-10-CM | POA: Diagnosis not present

## 2019-05-04 DIAGNOSIS — E559 Vitamin D deficiency, unspecified: Secondary | ICD-10-CM | POA: Diagnosis not present

## 2019-05-04 DIAGNOSIS — Z9189 Other specified personal risk factors, not elsewhere classified: Secondary | ICD-10-CM | POA: Diagnosis not present

## 2019-05-04 DIAGNOSIS — Z6841 Body Mass Index (BMI) 40.0 and over, adult: Secondary | ICD-10-CM

## 2019-05-04 DIAGNOSIS — R7303 Prediabetes: Secondary | ICD-10-CM

## 2019-05-04 MED ORDER — VITAMIN D (ERGOCALCIFEROL) 1.25 MG (50000 UNIT) PO CAPS: 50000.0000 [IU] | ORAL_CAPSULE | ORAL | 0 refills | Status: DC

## 2019-05-05 ENCOUNTER — Encounter (INDEPENDENT_AMBULATORY_CARE_PROVIDER_SITE_OTHER): Payer: Self-pay | Admitting: Bariatrics

## 2019-05-05 DIAGNOSIS — Z6841 Body Mass Index (BMI) 40.0 and over, adult: Secondary | ICD-10-CM | POA: Insufficient documentation

## 2019-05-05 NOTE — Progress Notes (Signed)
Chief Complaint:   OBESITY Ariana Mcdaniel is here to discuss her progress with her obesity treatment plan along with follow-up of her obesity related diagnoses. Ariana Mcdaniel is on the Category 4 Plan and states she is following her eating plan approximately 100% of the time. Ariana Mcdaniel states she is exercising 0 minutes 0 times per week.  Today's visit was #: 2 Starting weight: 239 lbs Starting date: 04/20/2019 Today's weight: 236 lbs Today's date: 05/04/2019 Total lbs lost to date: 3 Total lbs lost since last in-office visit: 3  Interim History: Ariana Mcdaniel struggles with her snacks.  Subjective:   Vitamin D deficiency. Ariana Mcdaniel is on no OTC medications. Last Vitamin D 19.3 on 04/20/2019.  Prediabetes. Ariana Mcdaniel has a diagnosis of prediabetes based on her elevated HgA1c and was informed this puts her at greater risk of developing diabetes. She continues to work on diet and exercise to decrease her risk of diabetes. She polyphagia.  Lab Results  Component Value Date   HGBA1C 5.7 (H) 04/20/2019   Lab Results  Component Value Date   INSULIN 17.7 04/20/2019   Other constipation. Ariana Mcdaniel started MiraLax.  At risk for diabetes mellitus. Ariana Mcdaniel is at higher than average risk for developing diabetes due to her obesity.   Assessment/Plan:   Vitamin D deficiency. Low Vitamin D level contributes to fatigue and are associated with obesity, breast, and colon cancer. She was given a prescription for Vitamin D, Ergocalciferol, (DRISDOL) 1.25 MG (50000 UNIT) CAPS capsule every week #4 with 0 refills and will follow-up for routine testing of Vitamin D, at least 2-3 times per year to avoid over-replacement.    Prediabetes. Ariana Mcdaniel will continue to work on weight loss, exercise, increasing healthy fats and protein, and decreasing simple carbohydrates to help decrease the risk of diabetes.   Other constipation. Ariana Mcdaniel was informed that a decrease in bowel movement frequency is normal while losing weight, but  stools should not be hard or painful. Orders and follow up as documented in patient record. She will increase her water intake, increase her intake of mixed vegetables, increase fiber, and will use flaxseed.   Counseling Getting to Good Bowel Health: Your goal is to have one soft bowel movement each day. Drink at least 8 glasses of water each day. Eat plenty of fiber (goal is over 25 grams each day). It is best to get most of your fiber from dietary sources which includes leafy green vegetables, fresh fruit, and whole grains. You may need to add fiber with the help of OTC fiber supplements. These include Metamucil, Citrucel, and Flaxseed. If you are still having trouble, try adding Miralax or Magnesium Citrate. If all of these changes do not work, Cabin crew.  At risk for diabetes mellitus. Ariana Mcdaniel was given approximately 15 minutes of diabetes education and counseling today. We discussed intensive lifestyle modifications today with an emphasis on weight loss as well as increasing exercise and decreasing simple carbohydrates in her diet. We also reviewed medication options with an emphasis on risk versus benefit of those discussed.   Repetitive spaced learning was employed today to elicit superior memory formation and behavioral change.  Class 3 severe obesity with serious comorbidity and body mass index (BMI) of 40.0 to 44.9 in adult, unspecified obesity type ([place]).  Ariana Mcdaniel is currently in the action stage of change. As such, her goal is to continue with weight loss efforts. She has agreed to the Category 4 Plan.   We independently reviewed labs from 04/20/2019  with the patient including CMP, Vitamin D, A1c, and insulin.  She will work on increasing her water intake.  Exercise goals: Ariana Mcdaniel will walk at work.  Behavioral modification strategies: increasing lean protein intake, decreasing simple carbohydrates, increasing vegetables, increasing water intake, decreasing eating out, no  skipping meals, meal planning and cooking strategies, keeping healthy foods in the home and planning for success.  Ariana Mcdaniel has agreed to follow-up with our clinic in 2 weeks. She was informed of the importance of frequent follow-up visits to maximize her success with intensive lifestyle modifications for her multiple health conditions.   Objective:   Blood pressure 105/70, pulse 89, temperature 98.8 F (37.1 C), temperature source Oral, height 5\' 2"  (1.575 m), weight 236 lb (107 kg), last menstrual period 04/22/2019, SpO2 96 %. Body mass index is 43.16 kg/m.  General: Cooperative, alert, well developed, in no acute distress. HEENT: Conjunctivae and lids unremarkable. Cardiovascular: Regular rhythm.  Lungs: Normal work of breathing. Neurologic: No focal deficits.   Lab Results  Component Value Date   CREATININE 0.74 04/20/2019   BUN 15 04/20/2019   NA 140 04/20/2019   K 4.9 04/20/2019   CL 105 04/20/2019   CO2 21 04/20/2019   Lab Results  Component Value Date   ALT 28 04/20/2019   AST 15 04/20/2019   ALKPHOS 98 04/20/2019   BILITOT 0.3 04/20/2019   Lab Results  Component Value Date   HGBA1C 5.7 (H) 04/20/2019   HGBA1C 5.6 10/14/2018   Lab Results  Component Value Date   INSULIN 17.7 04/20/2019   Lab Results  Component Value Date   TSH 1.740 10/14/2018   Lab Results  Component Value Date   CHOL 205 (H) 10/14/2018   HDL 64 10/14/2018   LDLCALC 113 (H) 10/14/2018   TRIG 141 10/14/2018   CHOLHDL 3.2 10/14/2018   Lab Results  Component Value Date   WBC 9.0 12/07/2018   HGB 12.3 12/07/2018   HCT 38.3 12/07/2018   MCV 80.1 12/07/2018   PLT 293.0 12/07/2018   No results found for: IRON, TIBC, FERRITIN  Attestation Statements:   Reviewed by clinician on day of visit: allergies, medications, problem list, medical history, surgical history, family history, social history, and previous encounter notes.  Migdalia Dk, am acting as Location manager for [employer], DO   I have reviewed the above documentation for accuracy and completeness, and I agree with the above. Jearld Lesch, DO

## 2019-05-19 ENCOUNTER — Ambulatory Visit (INDEPENDENT_AMBULATORY_CARE_PROVIDER_SITE_OTHER): Payer: BC Managed Care – PPO | Admitting: Family Medicine

## 2019-05-19 ENCOUNTER — Encounter (INDEPENDENT_AMBULATORY_CARE_PROVIDER_SITE_OTHER): Payer: Self-pay | Admitting: Family Medicine

## 2019-05-19 ENCOUNTER — Other Ambulatory Visit: Payer: Self-pay

## 2019-05-19 VITALS — BP 102/66 | HR 69 | Temp 98.7°F | Ht 62.0 in | Wt 229.0 lb

## 2019-05-19 DIAGNOSIS — Z6841 Body Mass Index (BMI) 40.0 and over, adult: Secondary | ICD-10-CM

## 2019-05-19 DIAGNOSIS — F3289 Other specified depressive episodes: Secondary | ICD-10-CM | POA: Diagnosis not present

## 2019-05-19 NOTE — Progress Notes (Signed)
Chief Complaint:   OBESITY Ariana Mcdaniel is here to discuss her progress with her obesity treatment plan along with follow-up of her obesity related diagnoses. Rohini is on the Category 4 Plan and states she is following her eating plan approximately 100% of the time. Shawntea states she is walking 20 minutes 5 times per week.  Today's visit was #: 3 Starting weight: 239 lbs Starting date: 04/20/2019 Today's weight: 229 lbs Today's date: 05/19/2019 Total lbs lost to date: 10 Total lbs lost since last in-office visit: 7  Interim History: [name]'s hunger is satisfied. She struggles to find microwave meals at lunch that are <300 calories. She does not always eat all of the vegetables that are on the plan.   Subjective:   1. Other depression, with emotional eating  [name] has seen Dr. Mallie Mussel 1 time. She does report cravings. She is allergic to bupropion.  Assessment/Plan:   1. Other depression, with emotional eating   [name] will see Dr. Mallie Mussel as needed and continue to use strategies to decrease emotional eating.   2. Class 3 severe obesity with serious comorbidity and body mass index (BMI) of 40.0 to 44.9 in adult, unspecified obesity type (HCC) [name] is currently in the action stage of change. As such, her goal is to continue with weight loss efforts. She has agreed to the Category 4 Plan and keeping a food journal and adhering to recommended goals of 350-550 calories and 35 grams of protein at lunch daily.   [name] may journal at lunch, and may increase microwave meal calories to 350.  Exercise goals: [name] is to increase cardio to 150 minutes per week. She is buying a row machine.  Behavioral modification strategies: increasing vegetables and keeping a strict food journal.  [name] has agreed to follow-up with our clinic in 2 weeks. She was informed of the importance of frequent follow-up visits to maximize her success with intensive lifestyle modifications for her multiple health  conditions.   Objective:   Blood pressure 102/66, pulse 69, temperature 98.7 F (37.1 C), temperature source Oral, height 5\' 2"  (1.575 m), weight 229 lb (103.9 kg), last menstrual period 04/22/2019, SpO2 99 %. Body mass index is 41.88 kg/m.  General: Cooperative, alert, well developed, in no acute distress. HEENT: Conjunctivae and lids unremarkable. Cardiovascular: Regular rhythm.  Lungs: Normal work of breathing. Neurologic: No focal deficits.   Lab Results  Component Value Date   CREATININE 0.74 04/20/2019   BUN 15 04/20/2019   NA 140 04/20/2019   K 4.9 04/20/2019   CL 105 04/20/2019   CO2 21 04/20/2019   Lab Results  Component Value Date   ALT 28 04/20/2019   AST 15 04/20/2019   ALKPHOS 98 04/20/2019   BILITOT 0.3 04/20/2019   Lab Results  Component Value Date   HGBA1C 5.7 (H) 04/20/2019   HGBA1C 5.6 10/14/2018   Lab Results  Component Value Date   INSULIN 17.7 04/20/2019   Lab Results  Component Value Date   TSH 1.740 10/14/2018   Lab Results  Component Value Date   CHOL 205 (H) 10/14/2018   HDL 64 10/14/2018   LDLCALC 113 (H) 10/14/2018   TRIG 141 10/14/2018   CHOLHDL 3.2 10/14/2018   Lab Results  Component Value Date   WBC 9.0 12/07/2018   HGB 12.3 12/07/2018   HCT 38.3 12/07/2018   MCV 80.1 12/07/2018   PLT 293.0 12/07/2018   No results found for: IRON, TIBC, FERRITIN  Attestation Statements:  Reviewed by clinician on day of visit: allergies, medications, problem list, medical history, surgical history, family history, social history, and previous encounter notes.   Wilhemena Durie, am acting as Location manager for [employer], FNP-C.  I have reviewed the above documentation for accuracy and completeness, and I agree with the above. -  Georgianne Fick, FNP

## 2019-05-23 ENCOUNTER — Encounter (INDEPENDENT_AMBULATORY_CARE_PROVIDER_SITE_OTHER): Payer: Self-pay | Admitting: Family Medicine

## 2019-05-23 DIAGNOSIS — F32A Depression, unspecified: Secondary | ICD-10-CM | POA: Insufficient documentation

## 2019-05-23 DIAGNOSIS — F329 Major depressive disorder, single episode, unspecified: Secondary | ICD-10-CM | POA: Insufficient documentation

## 2019-06-02 ENCOUNTER — Ambulatory Visit (INDEPENDENT_AMBULATORY_CARE_PROVIDER_SITE_OTHER): Payer: BC Managed Care – PPO | Admitting: Family Medicine

## 2019-06-02 ENCOUNTER — Encounter (INDEPENDENT_AMBULATORY_CARE_PROVIDER_SITE_OTHER): Payer: Self-pay | Admitting: Family Medicine

## 2019-06-02 ENCOUNTER — Other Ambulatory Visit: Payer: Self-pay

## 2019-06-02 VITALS — BP 101/58 | HR 70 | Temp 98.4°F | Ht 62.0 in | Wt 226.0 lb

## 2019-06-02 DIAGNOSIS — E559 Vitamin D deficiency, unspecified: Secondary | ICD-10-CM | POA: Diagnosis not present

## 2019-06-02 DIAGNOSIS — Z9189 Other specified personal risk factors, not elsewhere classified: Secondary | ICD-10-CM

## 2019-06-02 DIAGNOSIS — R7303 Prediabetes: Secondary | ICD-10-CM

## 2019-06-02 DIAGNOSIS — Z6841 Body Mass Index (BMI) 40.0 and over, adult: Secondary | ICD-10-CM

## 2019-06-02 MED ORDER — VITAMIN D (ERGOCALCIFEROL) 1.25 MG (50000 UNIT) PO CAPS: 50000.0000 [IU] | ORAL_CAPSULE | ORAL | 0 refills | Status: DC

## 2019-06-06 ENCOUNTER — Encounter (INDEPENDENT_AMBULATORY_CARE_PROVIDER_SITE_OTHER): Payer: Self-pay | Admitting: Family Medicine

## 2019-06-06 NOTE — Progress Notes (Signed)
Chief Complaint:   OBESITY Ariana Mcdaniel is here to discuss her progress with her obesity treatment plan along with follow-up of her obesity related diagnoses. Ariana Mcdaniel is on the Category 4 Plan and keeping a food journal and adhering to recommended goals of 350-550 calories and 35+ grams of protein at lunch daily and states she is following her eating plan approximately 90-95% of the time. Ariana Mcdaniel states she is walking and using the row machine for 30 minutes 5 times per week.  Today's visit was #: 4 Starting weight: 239 lbs Starting date: 04/20/2019 Today's weight: 226 lbs Today's date: 06/02/2019 Total lbs lost to date: 13 Total lbs lost since last in-office visit: 3  Interim History: [name] tends to skip meals on the weekends and in the evenings.   Subjective:   1. Pre-diabetes Ariana Mcdaniel denies polyphagia, and she is not on metformin. Lab Results  Component Value Date   HGBA1C 5.7 (H) 04/20/2019    2. Vitamin D deficiency Ariana Mcdaniel notes fatigue. She is on prescription Vit D, and last Vit D level was low at 19.  3. At risk for osteoporosis Ariana Mcdaniel is at higher risk of osteopenia and osteoporosis due to Vitamin D deficiency.   Assessment/Plan:   1. Pre-diabetes Ariana Mcdaniel will continue her meal plan, and will continue to work on weight loss, exercise, and decreasing simple carbohydrates to help decrease the risk of diabetes.   2. Vitamin D deficiency Low Vitamin D level contributes to fatigue and are associated with obesity, breast, and colon cancer. We will refill prescription Vitamin D for 1 month. Ariana Mcdaniel will follow-up for routine testing of Vitamin D, at least 2-3 times per year to avoid over-replacement.  - Vitamin D, Ergocalciferol, (DRISDOL) 1.25 MG (50000 UNIT) CAPS capsule; Take 1 capsule (50,000 Units total) by mouth every 7 (seven) days.  Dispense: 4 capsule; Refill: 0  3. At risk for osteoporosis Ariana Mcdaniel was given approximately 15 minutes of osteoporosis prevention counseling  today. Ariana Mcdaniel is at risk for osteopenia and osteoporosis due to her Vitamin D deficiency. She was encouraged to take her Vitamin D and follow her higher calcium diet and increase strengthening exercise to help strengthen her bones and decrease her risk of osteopenia and osteoporosis.  Repetitive spaced learning was employed today to elicit superior memory formation and behavioral change.  4. Class 3 severe obesity with serious comorbidity and body mass index (BMI) of 40.0 to 44.9 in adult, unspecified obesity type (HCC) Ariana Mcdaniel is currently in the action stage of change. As such, her goal is to continue with weight loss efforts. She has agreed to the Category 4 Plan and keeping a food journal and adhering to recommended goals of 350-550 calories and 35+ grams of protein ay lunch daily.   Handouts given today: Recipes and Breakfast with options.  Exercise goals: As is.  Behavioral modification strategies: no skipping meals and planning for success.  Ariana Mcdaniel has agreed to follow-up with our clinic in 2 to 3 weeks. She was informed of the importance of frequent follow-up visits to maximize her success with intensive lifestyle modifications for her multiple health conditions.   Objective:   Blood pressure (!) 101/58, pulse 70, temperature 98.4 F (36.9 C), temperature source Oral, height 5\' 2"  (1.575 m), weight 226 lb (102.5 kg), last menstrual period 05/19/2019, SpO2 98 %. Body mass index is 41.34 kg/m.  General: Cooperative, alert, well developed, in no acute distress. HEENT: Conjunctivae and lids unremarkable. Cardiovascular: Regular rhythm.  Lungs: Normal work of breathing.  Neurologic: No focal deficits.   Lab Results  Component Value Date   CREATININE 0.74 04/20/2019   BUN 15 04/20/2019   NA 140 04/20/2019   K 4.9 04/20/2019   CL 105 04/20/2019   CO2 21 04/20/2019   Lab Results  Component Value Date   ALT 28 04/20/2019   AST 15 04/20/2019   ALKPHOS 98 04/20/2019   BILITOT 0.3  04/20/2019   Lab Results  Component Value Date   HGBA1C 5.7 (H) 04/20/2019   HGBA1C 5.6 10/14/2018   Lab Results  Component Value Date   INSULIN 17.7 04/20/2019   Lab Results  Component Value Date   TSH 1.740 10/14/2018   Lab Results  Component Value Date   CHOL 205 (H) 10/14/2018   HDL 64 10/14/2018   LDLCALC 113 (H) 10/14/2018   TRIG 141 10/14/2018   CHOLHDL 3.2 10/14/2018   Lab Results  Component Value Date   WBC 9.0 12/07/2018   HGB 12.3 12/07/2018   HCT 38.3 12/07/2018   MCV 80.1 12/07/2018   PLT 293.0 12/07/2018   No results found for: IRON, TIBC, FERRITIN  Attestation Statements:   Reviewed by clinician on day of visit: allergies, medications, problem list, medical history, surgical history, family history, social history, and previous encounter notes.   Wilhemena Durie, am acting as Location manager for [employer], FNP-C.  I have reviewed the above documentation for accuracy and completeness, and I agree with the above. -  Georgianne Fick, FNP

## 2019-06-22 ENCOUNTER — Ambulatory Visit
Admission: RE | Admit: 2019-06-22 | Discharge: 2019-06-22 | Disposition: A | Discharge status: Home / Self Care | Payer: BC Managed Care – PPO | Source: Ambulatory Visit | Attending: Family Medicine | Admitting: Family Medicine

## 2019-06-22 ENCOUNTER — Other Ambulatory Visit: Payer: Self-pay

## 2019-06-22 ENCOUNTER — Other Ambulatory Visit: Payer: Self-pay | Admitting: Family Medicine

## 2019-06-22 DIAGNOSIS — N6310 Unspecified lump in the right breast, unspecified quadrant: Secondary | ICD-10-CM

## 2019-06-23 ENCOUNTER — Ambulatory Visit (INDEPENDENT_AMBULATORY_CARE_PROVIDER_SITE_OTHER): Payer: BC Managed Care – PPO | Admitting: Family Medicine

## 2019-06-23 ENCOUNTER — Encounter (INDEPENDENT_AMBULATORY_CARE_PROVIDER_SITE_OTHER): Payer: Self-pay | Admitting: Family Medicine

## 2019-06-23 VITALS — BP 117/71 | HR 76 | Temp 98.7°F | Ht 62.0 in | Wt 222.0 lb

## 2019-06-23 DIAGNOSIS — E559 Vitamin D deficiency, unspecified: Secondary | ICD-10-CM | POA: Diagnosis not present

## 2019-06-23 DIAGNOSIS — Z9189 Other specified personal risk factors, not elsewhere classified: Secondary | ICD-10-CM

## 2019-06-23 DIAGNOSIS — Z6841 Body Mass Index (BMI) 40.0 and over, adult: Secondary | ICD-10-CM

## 2019-06-23 DIAGNOSIS — R7303 Prediabetes: Secondary | ICD-10-CM

## 2019-06-23 MED ORDER — METFORMIN HCL 500 MG PO TABS: 500.0000 mg | ORAL_TABLET | Freq: Every day | ORAL | 0 refills | Status: DC

## 2019-06-27 NOTE — Progress Notes (Signed)
Chief Complaint:   OBESITY Ariana Mcdaniel is here to discuss her progress with her obesity treatment plan along with follow-up of her obesity related diagnoses. Ariana Mcdaniel is on the Category 4 Plan and keeping a food journal and adhering to recommended goals of 350-550 calories and 35+ grams of protein at lunch daily and states she is following her eating plan approximately 90% of the time. Ariana Mcdaniel states she is walking for 30 minutes 4 times per week.  Today's visit was #: 5 Starting weight: 239 lbs Starting date: 04/20/2019 Today's weight: 222 lbs Today's date: 06/23/2019 Total lbs lost to date: 17 Total lbs lost since last in-office visit: 4  Interim History: Ariana Mcdaniel was recently on vacation but made good choices. She has done quite well on the plan. She is still skipping occasional meals, but she is doing better.  Subjective:   1. Pre-diabetes Ariana Mcdaniel notes polyphagia. Last A1c was 5.7. She is not on metformin.  2. Vitamin D deficiency Ariana Mcdaniel's last Vit D level was low at 19.3. She is on high dose Vit D.  3. At risk for side effect of medication Ariana Mcdaniel is at risk for drug side effects due to starting metformin.  Assessment/Plan:   1. Pre-diabetes Ariana Mcdaniel will continue to work on weight loss, exercise, and decreasing simple carbohydrates to help decrease the risk of diabetes. Ariana Mcdaniel agreed to start metformin 500 mg q daily with lunch, with no refills.  - metFORMIN (GLUCOPHAGE) 500 MG tablet; Take 1 tablet (500 mg total) by mouth daily with breakfast.  Dispense: 30 tablet; Refill: 0  2. Vitamin D deficiency Low Vitamin D level contributes to fatigue and are associated with obesity, breast, and colon cancer. [name] agreed to continue taking prescription Vitamin D 50,000 IU every week and will follow-up for routine testing of Vitamin D, at least 2-3 times per year to avoid over-replacement.  3. At risk for side effect of medication Ariana Mcdaniel was given approximately 15 minutes of drug side  effect counseling today.  We discussed side effect possibility and risk versus benefits. Ariana Mcdaniel agreed to the medication and will contact this office if these side effects are intolerable.  Repetitive spaced learning was employed today to elicit superior memory formation and behavioral change.  4. Class 3 severe obesity with serious comorbidity and body mass index (BMI) of 40.0 to 44.9 in adult, unspecified obesity type (HCC) Ariana Mcdaniel is currently in the action stage of change. As such, her goal is to continue with weight loss efforts. She has agreed to the Category 4 Plan.   Exercise goals: As is.  Behavioral modification strategies: increasing lean protein intake, no skipping meals and planning for success.  Ariana Mcdaniel has agreed to follow-up with our clinic in 2 to 3 weeks. She was informed of the importance of frequent follow-up visits to maximize her success with intensive lifestyle modifications for her multiple health conditions.   Objective:   Blood pressure 117/71, pulse 76, temperature 98.7 F (37.1 C), height 5\' 2"  (1.575 m), weight 222 lb (100.7 kg), last menstrual period 06/09/2019, SpO2 97 %. Body mass index is 40.6 kg/m.  General: Cooperative, alert, well developed, in no acute distress. HEENT: Conjunctivae and lids unremarkable. Cardiovascular: Regular rhythm.  Lungs: Normal work of breathing. Neurologic: No focal deficits.   Lab Results  Component Value Date   CREATININE 0.74 04/20/2019   BUN 15 04/20/2019   NA 140 04/20/2019   K 4.9 04/20/2019   CL 105 04/20/2019   CO2 21 04/20/2019  Lab Results  Component Value Date   ALT 28 04/20/2019   AST 15 04/20/2019   ALKPHOS 98 04/20/2019   BILITOT 0.3 04/20/2019   Lab Results  Component Value Date   HGBA1C 5.7 (H) 04/20/2019   HGBA1C 5.6 10/14/2018   Lab Results  Component Value Date   INSULIN 17.7 04/20/2019   Lab Results  Component Value Date   TSH 1.740 10/14/2018   Lab Results  Component Value Date    CHOL 205 (H) 10/14/2018   HDL 64 10/14/2018   LDLCALC 113 (H) 10/14/2018   TRIG 141 10/14/2018   CHOLHDL 3.2 10/14/2018   Lab Results  Component Value Date   WBC 9.0 12/07/2018   HGB 12.3 12/07/2018   HCT 38.3 12/07/2018   MCV 80.1 12/07/2018   PLT 293.0 12/07/2018   No results found for: IRON, TIBC, FERRITIN  Attestation Statements:   Reviewed by clinician on day of visit: allergies, medications, problem list, medical history, surgical history, family history, social history, and previous encounter notes.   Wilhemena Durie, am acting as Location manager for [employer], FNP-C.  I have reviewed the above documentation for accuracy and completeness, and I agree with the above. -  Georgianne Fick, FNP

## 2019-06-28 ENCOUNTER — Encounter (INDEPENDENT_AMBULATORY_CARE_PROVIDER_SITE_OTHER): Payer: Self-pay | Admitting: Family Medicine

## 2019-07-19 ENCOUNTER — Other Ambulatory Visit: Payer: Self-pay

## 2019-07-19 ENCOUNTER — Ambulatory Visit (INDEPENDENT_AMBULATORY_CARE_PROVIDER_SITE_OTHER): Payer: BC Managed Care – PPO | Admitting: Family Medicine

## 2019-07-19 ENCOUNTER — Encounter (INDEPENDENT_AMBULATORY_CARE_PROVIDER_SITE_OTHER): Payer: Self-pay | Admitting: Family Medicine

## 2019-07-19 VITALS — BP 102/67 | HR 74 | Temp 98.7°F | Ht 62.0 in | Wt 218.0 lb

## 2019-07-19 DIAGNOSIS — R7303 Prediabetes: Secondary | ICD-10-CM | POA: Diagnosis not present

## 2019-07-19 DIAGNOSIS — E66812 Obesity, class 2: Secondary | ICD-10-CM

## 2019-07-19 DIAGNOSIS — Z6839 Body mass index (BMI) 39.0-39.9, adult: Secondary | ICD-10-CM

## 2019-07-19 DIAGNOSIS — Z9189 Other specified personal risk factors, not elsewhere classified: Secondary | ICD-10-CM

## 2019-07-19 DIAGNOSIS — E559 Vitamin D deficiency, unspecified: Secondary | ICD-10-CM | POA: Diagnosis not present

## 2019-07-19 MED ORDER — BD PEN NEEDLE NANO 2ND GEN 32G X 4 MM MISC: 0 refills | Status: DC

## 2019-07-19 MED ORDER — SAXENDA 18 MG/3ML ~~LOC~~ SOPN: 3.0000 mg | PEN_INJECTOR | Freq: Every day | SUBCUTANEOUS | 0 refills | Status: DC

## 2019-07-19 MED ORDER — VITAMIN D (ERGOCALCIFEROL) 1.25 MG (50000 UNIT) PO CAPS: 50000.0000 [IU] | ORAL_CAPSULE | ORAL | 0 refills | Status: DC

## 2019-07-19 NOTE — Progress Notes (Signed)
Chief Complaint:   OBESITY Ariana Mcdaniel is here to discuss her progress with her obesity treatment plan along with follow-up of her obesity related diagnoses. Ariana Mcdaniel is on the Category 4 Plan and states she is following her eating plan approximately 95% of the time. Ariana Mcdaniel states she is walking, using kettle bells, and running for 30 minutes 5 times per week.  Today's visit was #: 6 Starting weight: 239 lbs Starting date: 04/20/2019 Today's weight: 218 lbs Today's date: 07/19/2019 Total lbs lost to date: 21 Total lbs lost since last in-office visit: 4  Interim History: Ariana Mcdaniel notes hunger during the day. She is adhering to the plan exceedingly well and she has lost 21 lbs total. She is happy with the Category 4 plan and very motivated to lose weight to improve her health.  Subjective:   1. Pre-diabetes I started Ariana Mcdaniel on metformin at her last office visit for polyphagia, but she had severe nausea. She still notes excessive hunger. Lab Results  Component Value Date   HGBA1C 5.7 (H) 04/20/2019    2. Vitamin D deficiency Ariana Mcdaniel's last Vit D level was low at 41.2. She is on RX high dose vit D.  3. At risk for nausea Ariana Mcdaniel is at risk for nausea due to starting Saxenda.  Assessment/Plan:   1. Pre-diabetes Ariana Mcdaniel will continue to work on weight loss, exercise, and decreasing simple carbohydrates to help decrease the risk of diabetes.  Ariana Mcdaniel agreed to discontinue metformin and start Saxenda.  2. Vitamin D deficiency Low Vitamin D level contributes to fatigue and are associated with obesity, breast, and colon cancer. We will refill prescription Vitamin D for 1 month. Ariana Mcdaniel will follow-up for routine testing of Vitamin D, at least 2-3 times per year to avoid over-replacement.  - Vitamin D, Ergocalciferol, (DRISDOL) 1.25 MG (50000 UNIT) CAPS capsule; Take 1 capsule (50,000 Units total) by mouth every 7 (seven) days.  Dispense: 4 capsule; Refill: 0  3. At risk for nausea Ariana Mcdaniel was given approximately 15 minutes of nausea prevention counseling today. Ariana Mcdaniel is at risk for nausea due to start of Saxenda.  She was encouraged to titrate her medication slowly, make sure to stay hydrated, eat smaller portions throughout the day, and avoid high fat meals.   4. Class 2 severe obesity with serious comorbidity and body mass index (BMI) of 39.0 to 39.9 in adult, unspecified obesity type (HCC) Ariana Mcdaniel is currently in the action stage of change. As such, her goal is to continue with weight loss efforts. She has agreed to the Category 4 Plan.   We discussed various medication options to help Ariana Mcdaniel with her weight loss efforts and we both agreed to start Saxenda 3.0 mg SubQ daily with no refills (she is to take 0.3 mg for 3-4 days, then increase to 0.6 mg if no nausea); and nano needles #100 with no refills.  - Liraglutide -Weight Management (SAXENDA) 18 MG/3ML SOPN; Inject 0.5 mLs (3 mg total) into the skin daily.  Dispense: 5 pen; Refill: 0 - Insulin Pen Needle (BD PEN NEEDLE NANO 2ND GEN) 32G X 4 MM MISC; Use 1 needle daily to inject Saxenda.  Dispense: 100 each; Refill: 0 Ariana Mcdaniel denies personal history of thyroid cancer, history of pancreatitis, or current cholelithiasis. She was informed of side effects. Patient will start at 0.3 mg SQ daily for 3-4 days and then advance to 0.6 mg SQ daily if no nausea.   Exercise goals: As is.  Behavioral modification strategies:  planning for success.  Ariana Mcdaniel has agreed to follow-up with our clinic in 3 weeks. She was informed of the importance of frequent follow-up visits to maximize her success with intensive lifestyle modifications for her multiple health conditions.   Objective:   Blood pressure 102/67, pulse 74, temperature 98.7 F (37.1 C), temperature source Oral, height 5\' 2"  (1.575 m), weight 218 lb (98.9 kg), last menstrual period 07/05/2019, SpO2 98 %. Body mass index is 39.87 kg/m.  General: Cooperative, alert, well  developed, in no acute distress. HEENT: Conjunctivae and lids unremarkable. Cardiovascular: Regular rhythm.  Lungs: Normal work of breathing. Neurologic: No focal deficits.   Lab Results  Component Value Date   CREATININE 0.74 04/20/2019   BUN 15 04/20/2019   NA 140 04/20/2019   K 4.9 04/20/2019   CL 105 04/20/2019   CO2 21 04/20/2019   Lab Results  Component Value Date   ALT 28 04/20/2019   AST 15 04/20/2019   ALKPHOS 98 04/20/2019   BILITOT 0.3 04/20/2019   Lab Results  Component Value Date   HGBA1C 5.7 (H) 04/20/2019   HGBA1C 5.6 10/14/2018   Lab Results  Component Value Date   INSULIN 17.7 04/20/2019   Lab Results  Component Value Date   TSH 1.740 10/14/2018   Lab Results  Component Value Date   CHOL 205 (H) 10/14/2018   HDL 64 10/14/2018   LDLCALC 113 (H) 10/14/2018   TRIG 141 10/14/2018   CHOLHDL 3.2 10/14/2018   Lab Results  Component Value Date   WBC 9.0 12/07/2018   HGB 12.3 12/07/2018   HCT 38.3 12/07/2018   MCV 80.1 12/07/2018   PLT 293.0 12/07/2018   No results found for: IRON, TIBC, FERRITIN  Attestation Statements:   Reviewed by clinician on day of visit: allergies, medications, problem list, medical history, surgical history, family history, social history, and previous encounter notes.   Wilhemena Durie, am acting as Location manager for [employer], FNP-C.  I have reviewed the above documentation for accuracy and completeness, and I agree with the above. -  Georgianne Fick, FNP

## 2019-07-20 ENCOUNTER — Encounter (INDEPENDENT_AMBULATORY_CARE_PROVIDER_SITE_OTHER): Payer: Self-pay

## 2019-08-09 ENCOUNTER — Encounter (INDEPENDENT_AMBULATORY_CARE_PROVIDER_SITE_OTHER): Payer: Self-pay | Admitting: Family Medicine

## 2019-08-09 ENCOUNTER — Other Ambulatory Visit: Payer: Self-pay

## 2019-08-09 ENCOUNTER — Ambulatory Visit (INDEPENDENT_AMBULATORY_CARE_PROVIDER_SITE_OTHER): Payer: BC Managed Care – PPO | Admitting: Family Medicine

## 2019-08-09 VITALS — BP 94/64 | HR 85 | Temp 98.2°F | Ht 62.0 in | Wt 211.0 lb

## 2019-08-09 DIAGNOSIS — K5909 Other constipation: Secondary | ICD-10-CM

## 2019-08-09 DIAGNOSIS — Z6838 Body mass index (BMI) 38.0-38.9, adult: Secondary | ICD-10-CM | POA: Diagnosis not present

## 2019-08-10 ENCOUNTER — Encounter (INDEPENDENT_AMBULATORY_CARE_PROVIDER_SITE_OTHER): Payer: Self-pay | Admitting: Family Medicine

## 2019-08-10 NOTE — Progress Notes (Signed)
Chief Complaint:   OBESITY Ariana Mcdaniel is here to discuss her progress with her obesity treatment plan along with follow-up of her obesity related diagnoses. Ariana Mcdaniel is on the Category 4 Plan and states she is following her eating plan approximately 90% of the time. Ariana Mcdaniel states she is walking for 30 minutes 5 times per week.  Today's visit was #: 7 Starting weight: 239 lbs Starting date: 04/20/2019 Today's weight: 211 lbs Today's date: 08/09/2019 Total lbs lost to date: 28 Total lbs lost since last in-office visit: 7  Interim History: Ariana Mcdaniel was started on Saxenda at her last office visit and she is tolerating this well. She feels her appetite is very well controlled with Saxenda. She is on Saxenda 0.6 mg daily. She denies nausea but notes constipation. She is eating all of the food on the plan now, but not when she first started Saxenda.  Subjective:   1. Other constipation Ariana Mcdaniel has constipation from [place]. She was taking miralax daily and she has added Ducolax as needed.  Assessment/Plan:   1. Other constipation . Ariana Mcdaniel will continue miralax and Ducolax. Orders and follow up as documented in patient record.    2. Class 2 severe obesity with serious comorbidity and body mass index (BMI) of 38.0 to 38.9 in adult, unspecified obesity type (HCC) Ariana Mcdaniel is currently in the action stage of change. As such, her goal is to continue with weight loss efforts. She has agreed to the Category 4 Plan.   We discussed various medication options to help Ariana Mcdaniel with her weight loss efforts and we both agreed to continue Saxenda at 0.6 mg daily.  Exercise goals: As is.  Behavioral modification strategies: increasing lean protein intake, increasing water intake and no skipping meals.  Ariana Mcdaniel has agreed to follow-up with our clinic in 3 weeks. She was informed of the importance of frequent follow-up visits to maximize her success with intensive lifestyle modifications for her multiple health  conditions.   Objective:   Blood pressure 94/64, pulse 85, temperature 98.2 F (36.8 C), temperature source Oral, height 5\' 2"  (1.575 m), weight 211 lb (95.7 kg), SpO2 97 %. Body mass index is 38.59 kg/m.  General: Cooperative, alert, well developed, in no acute distress. HEENT: Conjunctivae and lids unremarkable. Cardiovascular: Regular rhythm.  Lungs: Normal work of breathing. Neurologic: No focal deficits.   Lab Results  Component Value Date   CREATININE 0.74 04/20/2019   BUN 15 04/20/2019   NA 140 04/20/2019   K 4.9 04/20/2019   CL 105 04/20/2019   CO2 21 04/20/2019   Lab Results  Component Value Date   ALT 28 04/20/2019   AST 15 04/20/2019   ALKPHOS 98 04/20/2019   BILITOT 0.3 04/20/2019   Lab Results  Component Value Date   HGBA1C 5.7 (H) 04/20/2019   HGBA1C 5.6 10/14/2018   Lab Results  Component Value Date   INSULIN 17.7 04/20/2019   Lab Results  Component Value Date   TSH 1.740 10/14/2018   Lab Results  Component Value Date   CHOL 205 (H) 10/14/2018   HDL 64 10/14/2018   LDLCALC 113 (H) 10/14/2018   TRIG 141 10/14/2018   CHOLHDL 3.2 10/14/2018   Lab Results  Component Value Date   WBC 9.0 12/07/2018   HGB 12.3 12/07/2018   HCT 38.3 12/07/2018   MCV 80.1 12/07/2018   PLT 293.0 12/07/2018   No results found for: IRON, TIBC, FERRITIN  Attestation Statements:   Reviewed by clinician on day  of visit: allergies, medications, problem list, medical history, surgical history, family history, social history, and previous encounter notes.   Wilhemena Durie, am acting as Location manager for [employer], FNP-C.  I have reviewed the above documentation for accuracy and completeness, and I agree with the above. -  Georgianne Fick, FNP

## 2019-08-24 ENCOUNTER — Encounter: Payer: Self-pay | Admitting: Radiology

## 2019-08-29 ENCOUNTER — Ambulatory Visit (INDEPENDENT_AMBULATORY_CARE_PROVIDER_SITE_OTHER): Payer: BC Managed Care – PPO | Admitting: Family Medicine

## 2019-08-29 ENCOUNTER — Other Ambulatory Visit: Payer: Self-pay

## 2019-08-29 ENCOUNTER — Encounter (INDEPENDENT_AMBULATORY_CARE_PROVIDER_SITE_OTHER): Payer: Self-pay | Admitting: Family Medicine

## 2019-08-29 VITALS — BP 103/70 | HR 82 | Temp 98.5°F | Ht 62.0 in | Wt 211.0 lb

## 2019-08-29 DIAGNOSIS — E559 Vitamin D deficiency, unspecified: Secondary | ICD-10-CM

## 2019-08-29 DIAGNOSIS — Z6838 Body mass index (BMI) 38.0-38.9, adult: Secondary | ICD-10-CM | POA: Diagnosis not present

## 2019-08-29 DIAGNOSIS — R7303 Prediabetes: Secondary | ICD-10-CM | POA: Diagnosis not present

## 2019-08-29 MED ORDER — VITAMIN D (ERGOCALCIFEROL) 1.25 MG (50000 UNIT) PO CAPS: 50000.0000 [IU] | ORAL_CAPSULE | ORAL | 0 refills | Status: DC

## 2019-08-31 NOTE — Progress Notes (Signed)
Chief Complaint:   OBESITY Ariana Mcdaniel is here to discuss her progress with her obesity treatment plan along with follow-up of her obesity related diagnoses. Ariana Mcdaniel is on the Category 4 Plan and states she is following her eating plan approximately 60% of the time. Ariana Mcdaniel states she is doing 0 minutes 0 times per week.  Today's visit was #: 8 Starting weight: 239 lbs Starting date: 04/20/2019 Today's weight: 211 lbs Today's date: 08/29/2019 Total lbs lost to date: 28 Total lbs lost since last in-office visit: 0  Interim History: Ariana Mcdaniel notes she has been to several celebrations over the past few weeks and did eat some off plan foods. She is now back on the plan starting today. She is on Saxenda 0.6 mg daily and notes some hunger. However, she has not been eating on the plan and eating too many carbohydrates.  Subjective:   1. Vitamin D deficiency Florestine's last Vit D low at 19.3. She is on prescription Vit D.  2. Pre-diabetes Valine has a diagnosis of pre-diabetes based on her elevated Hgb A1c and was informed this puts her at greater risk of developing diabetes. Last A1c was 5.7 and she is not on metformin. She is on Saxenda for appetite reduction. She continues to work on diet and exercise to decrease her risk of diabetes. She denies nausea or hypoglycemia.  Lab Results  Component Value Date   HGBA1C 5.7 (H) 04/20/2019   Lab Results  Component Value Date   INSULIN 17.7 04/20/2019   Assessment/Plan:   1. Vitamin D deficiency Low Vitamin D level contributes to fatigue and are associated with obesity, breast, and colon cancer. We will refill prescription Vitamin D for 1 month. [place] will follow-up for routine testing of Vitamin D, at least 2-3 times per year to avoid over-replacement.  - Vitamin D, Ergocalciferol, (DRISDOL) 1.25 MG (50000 UNIT) CAPS capsule; Take 1 capsule (50,000 Units total) by mouth every 7 (seven) days.  Dispense: 4 capsule; Refill: 0  2.  Pre-diabetes Ariana Mcdaniel will continue her meal plan, and will continue to work on weight loss, exercise, and decreasing simple carbohydrates to help decrease the risk of diabetes.   3. Class 2 severe obesity with serious comorbidity and body mass index (BMI) of 38.0 to 38.9 in adult, unspecified obesity type (HCC) Ariana Mcdaniel is currently in the action stage of change. As such, her goal is to continue with weight loss efforts. She has agreed to the Category 4 Plan.   We discussed various medication options to help Ariana Mcdaniel with her weight loss efforts and we both agreed to continue Saxenda and she may increase to 1.2 mg if needed. She will try eating on the plan first to see if this aleviates her hunger.  Exercise goals: No exercise has been prescribed at this time.  Behavioral modification strategies: increasing lean protein intake, decreasing simple carbohydrates and travel eating strategies.  Ariana Mcdaniel has agreed to follow-up with our clinic in 2 to 3 weeks. She was informed of the importance of frequent follow-up visits to maximize her success with intensive lifestyle modifications for her multiple health conditions.   Objective:   Blood pressure 103/70, pulse 82, temperature 98.5 F (36.9 C), temperature source Oral, height 5\' 2"  (1.575 m), weight 211 lb (95.7 kg), SpO2 99 %. Body mass index is 38.59 kg/m.  General: Cooperative, alert, well developed, in no acute distress. HEENT: Conjunctivae and lids unremarkable. Cardiovascular: Regular rhythm.  Lungs: Normal work of breathing. Neurologic: No focal deficits.  Lab Results  Component Value Date   CREATININE 0.74 04/20/2019   BUN 15 04/20/2019   NA 140 04/20/2019   K 4.9 04/20/2019   CL 105 04/20/2019   CO2 21 04/20/2019   Lab Results  Component Value Date   ALT 28 04/20/2019   AST 15 04/20/2019   ALKPHOS 98 04/20/2019   BILITOT 0.3 04/20/2019   Lab Results  Component Value Date   HGBA1C 5.7 (H) 04/20/2019   HGBA1C 5.6 10/14/2018    Lab Results  Component Value Date   INSULIN 17.7 04/20/2019   Lab Results  Component Value Date   TSH 1.740 10/14/2018   Lab Results  Component Value Date   CHOL 205 (H) 10/14/2018   HDL 64 10/14/2018   LDLCALC 113 (H) 10/14/2018   TRIG 141 10/14/2018   CHOLHDL 3.2 10/14/2018   Lab Results  Component Value Date   WBC 9.0 12/07/2018   HGB 12.3 12/07/2018   HCT 38.3 12/07/2018   MCV 80.1 12/07/2018   PLT 293.0 12/07/2018   No results found for: IRON, TIBC, FERRITIN  Attestation Statements:   Reviewed by clinician on day of visit: allergies, medications, problem list, medical history, surgical history, family history, social history, and previous encounter notes.   Wilhemena Durie, am acting as Location manager for [employer], FNP-C.  I have reviewed the above documentation for accuracy and completeness, and I agree with the above. -  Georgianne Fick, FNP

## 2019-09-05 ENCOUNTER — Encounter (INDEPENDENT_AMBULATORY_CARE_PROVIDER_SITE_OTHER): Payer: Self-pay | Admitting: Family Medicine

## 2019-09-15 ENCOUNTER — Encounter (INDEPENDENT_AMBULATORY_CARE_PROVIDER_SITE_OTHER): Payer: Self-pay | Admitting: Family Medicine

## 2019-09-15 ENCOUNTER — Other Ambulatory Visit: Payer: Self-pay

## 2019-09-15 ENCOUNTER — Ambulatory Visit (INDEPENDENT_AMBULATORY_CARE_PROVIDER_SITE_OTHER): Payer: BC Managed Care – PPO | Admitting: Family Medicine

## 2019-09-15 VITALS — BP 101/67 | HR 86 | Temp 98.2°F | Ht 62.0 in | Wt 209.0 lb

## 2019-09-15 DIAGNOSIS — R7303 Prediabetes: Secondary | ICD-10-CM

## 2019-09-15 DIAGNOSIS — Z6838 Body mass index (BMI) 38.0-38.9, adult: Secondary | ICD-10-CM | POA: Diagnosis not present

## 2019-09-20 NOTE — Progress Notes (Signed)
Chief Complaint:   OBESITY Ariana Mcdaniel is here to discuss her progress with her obesity treatment plan along with follow-up of her obesity related diagnoses. Ariana Mcdaniel is on the Category 4 Plan and states she is following her eating plan approximately 80% of the time. Ariana Mcdaniel states she is doing resistance 3-4 times per week.  Today's visit was #: 9 Starting weight: 239 lbs Starting date: 04/20/2019 Today's weight: 209 lbs Today's date: 09/15/2019 Total lbs lost to date: 30 Total lbs lost since last in-office visit: 2  Interim History: Ariana Mcdaniel is taking 0.9 mg Saxenda daily, and her hunger is well controlled. She did have a beach trip this past weekend but still lost 2 lbs. She is going to [place] next week. She is getting all of her protein in.  Subjective:   1. Pre-diabetes Ariana Mcdaniel has a diagnosis of pre-diabetes based on her elevated Hgb A1c and was informed this puts her at greater risk of developing diabetes. She is not on metformin. She is on Saxenda and she denies polyphagia. She continues to work on diet and exercise to decrease her risk of diabetes.   Lab Results  Component Value Date   HGBA1C 5.7 (H) 04/20/2019   Lab Results  Component Value Date   INSULIN 17.7 04/20/2019   Assessment/Plan:   1. Pre-diabetes Ariana Mcdaniel will continue her meal plan, and will continue to work on weight loss, exercise, and decreasing simple carbohydrates to help decrease the risk of diabetes.   2. Class 2 severe obesity with serious comorbidity and body mass index (BMI) of 38.0 to 38.9 in adult, unspecified obesity type (HCC) Ariana Mcdaniel is currently in the action stage of change. As such, her goal is to continue with weight loss efforts. She has agreed to the Category 4 Plan.   We discussed various medication options to help Ariana Mcdaniel with her weight loss efforts and we both agreed to continue Saxenda at 0.9 mg, but she may go up to 1.2 mg daily.  Exercise goals: Add cardio.  Behavioral modification  strategies: travel eating strategies.  Ariana Mcdaniel has agreed to follow-up with our clinic in 3 to 4 weeks with Dr. Owens Shark. She was informed of the importance of frequent follow-up visits to maximize her success with intensive lifestyle modifications for her multiple health conditions.   Objective:   Blood pressure 101/67, pulse 86, temperature 98.2 F (36.8 C), temperature source Oral, height 5\' 2"  (1.575 m), weight 209 lb (94.8 kg), last menstrual period 08/24/2019, SpO2 97 %. Body mass index is 38.23 kg/m.  General: Cooperative, alert, well developed, in no acute distress. HEENT: Conjunctivae and lids unremarkable. Cardiovascular: Regular rhythm.  Lungs: Normal work of breathing. Neurologic: No focal deficits.   Lab Results  Component Value Date   CREATININE 0.74 04/20/2019   BUN 15 04/20/2019   NA 140 04/20/2019   K 4.9 04/20/2019   CL 105 04/20/2019   CO2 21 04/20/2019   Lab Results  Component Value Date   ALT 28 04/20/2019   AST 15 04/20/2019   ALKPHOS 98 04/20/2019   BILITOT 0.3 04/20/2019   Lab Results  Component Value Date   HGBA1C 5.7 (H) 04/20/2019   HGBA1C 5.6 10/14/2018   Lab Results  Component Value Date   INSULIN 17.7 04/20/2019   Lab Results  Component Value Date   TSH 1.740 10/14/2018   Lab Results  Component Value Date   CHOL 205 (H) 10/14/2018   HDL 64 10/14/2018   LDLCALC 113 (H) 10/14/2018  TRIG 141 10/14/2018   CHOLHDL 3.2 10/14/2018   Lab Results  Component Value Date   WBC 9.0 12/07/2018   HGB 12.3 12/07/2018   HCT 38.3 12/07/2018   MCV 80.1 12/07/2018   PLT 293.0 12/07/2018   No results found for: IRON, TIBC, FERRITIN  Attestation Statements:   Reviewed by clinician on day of visit: allergies, medications, problem list, medical history, surgical history, family history, social history, and previous encounter notes.   Wilhemena Durie, am acting as Location manager for [employer], FNP-C.  I have reviewed the above  documentation for accuracy and completeness, and I agree with the above. -  Georgianne Fick, FNP

## 2019-09-21 ENCOUNTER — Encounter (INDEPENDENT_AMBULATORY_CARE_PROVIDER_SITE_OTHER): Payer: Self-pay | Admitting: Family Medicine

## 2019-10-10 ENCOUNTER — Ambulatory Visit (INDEPENDENT_AMBULATORY_CARE_PROVIDER_SITE_OTHER): Payer: BC Managed Care – PPO | Admitting: Bariatrics

## 2019-10-10 ENCOUNTER — Other Ambulatory Visit: Payer: Self-pay

## 2019-10-10 ENCOUNTER — Encounter (INDEPENDENT_AMBULATORY_CARE_PROVIDER_SITE_OTHER): Payer: Self-pay | Admitting: Bariatrics

## 2019-10-10 VITALS — BP 127/78 | HR 77 | Temp 98.1°F | Ht 62.0 in | Wt 208.0 lb

## 2019-10-10 DIAGNOSIS — E559 Vitamin D deficiency, unspecified: Secondary | ICD-10-CM | POA: Diagnosis not present

## 2019-10-10 DIAGNOSIS — Z6839 Body mass index (BMI) 39.0-39.9, adult: Secondary | ICD-10-CM

## 2019-10-10 DIAGNOSIS — Z9189 Other specified personal risk factors, not elsewhere classified: Secondary | ICD-10-CM

## 2019-10-10 DIAGNOSIS — R7303 Prediabetes: Secondary | ICD-10-CM | POA: Diagnosis not present

## 2019-10-10 DIAGNOSIS — Z6838 Body mass index (BMI) 38.0-38.9, adult: Secondary | ICD-10-CM

## 2019-10-10 MED ORDER — BD PEN NEEDLE NANO 2ND GEN 32G X 4 MM MISC: 0 refills | Status: DC

## 2019-10-10 MED ORDER — VITAMIN D (ERGOCALCIFEROL) 1.25 MG (50000 UNIT) PO CAPS: 50000.0000 [IU] | ORAL_CAPSULE | ORAL | 0 refills | Status: DC

## 2019-10-10 MED ORDER — SAXENDA 18 MG/3ML ~~LOC~~ SOPN: 3.0000 mg | PEN_INJECTOR | Freq: Every day | SUBCUTANEOUS | 0 refills | Status: DC

## 2019-10-10 NOTE — Progress Notes (Signed)
Chief Complaint:   Ariana Mcdaniel is here to discuss her progress with her Ariana treatment plan along with follow-up of her Ariana related diagnoses. Ariana Mcdaniel is on the Category 4 Plan and states she is following her eating plan approximately 80% of the time. Ariana Mcdaniel states she is doing weights 30 minutes 3-4 times per week.  Today's visit was #: 10 Starting weight: 239 lbs Starting date: 04/20/2019 Today's weight: 208 lbs Today's date: 10/10/2019 Total lbs lost to date: 31 Total lbs lost since last in-office visit: 1  Interim History: Ariana Mcdaniel is down 1 lb. She reports getting adequate protein and water intake.  Subjective:   Vitamin D deficiency. No nausea, vomiting, or muscle weakness.    Ref. Range 04/20/2019 11:40  Vitamin D, 25-Hydroxy Latest Ref Range: 30.0 - 100.0 ng/mL 19.3 (L)   Prediabetes. Ariana Mcdaniel has a diagnosis of prediabetes based on her elevated HgA1c and was informed this puts her at greater risk of developing diabetes. She continues to work on diet and exercise to decrease her risk of diabetes. She denies nausea or hypoglycemia. No polyphagia.  Lab Results  Component Value Date   HGBA1C 5.7 (H) 04/20/2019   Lab Results  Component Value Date   INSULIN 17.7 04/20/2019   At risk for osteoporosis. Ariana Mcdaniel is at higher risk of osteopenia and osteoporosis due to Vitamin D deficiency.   Assessment/Plan:   Vitamin D deficiency. Low Vitamin D level contributes to fatigue and are associated with Ariana, breast, and colon cancer. She was given a prescription for Vitamin D, Ergocalciferol, (DRISDOL) 1.25 MG (50000 UNIT) CAPS capsule every week #4 with 0 refills and will follow-up for routine testing of Vitamin D, at least 2-3 times per year to avoid over-replacement.   Prediabetes. Ariana Mcdaniel will continue to work on weight loss, exercise, increasing healthy fats and protein, and decreasing simple carbohydrates to help decrease the risk of diabetes.   At risk for  osteoporosis. Ariana Mcdaniel was given approximately 15 minutes of osteoporosis prevention counseling today. Ariana Mcdaniel is at risk for osteopenia and osteoporosis due to her Vitamin D deficiency. She was encouraged to take her Vitamin D and follow her higher calcium diet and increase strengthening exercise to help strengthen her bones and decrease her risk of osteopenia and osteoporosis.  Repetitive spaced learning was employed today to elicit superior memory formation and behavioral change.  Class 2 severe Ariana with serious comorbidity and body mass index (BMI) of 38.0 to 38.9 in adult, unspecified Ariana type ([place]). Prescriptions were given for Liraglutide - Weight Management (SAXENDA) 18 MG/3ML SOPN inject into skin daily, 1 month supply with 0 refills and insulin Pen Needle (BD PEN NEEDLE NANO 2ND GEN) 32G X 4 MM MISC, 1 needle daily for Saxenda #50 with 0 refills.  Ariana Mcdaniel is currently in the action stage of change. As such, her goal is to continue with weight loss efforts. She has agreed to the Category 4 Plan.   Exercise goals: All adults should avoid inactivity. Some physical activity is better than none, and adults who participate in any amount of physical activity gain some health benefits.  Behavioral modification strategies: increasing lean protein intake, decreasing simple carbohydrates, increasing vegetables, increasing water intake, decreasing eating out, no skipping meals, meal planning and cooking strategies, keeping healthy foods in the home and planning for success.  Ariana Mcdaniel has agreed to follow-up with our clinic in 2-3 weeks. She was informed of the importance of frequent follow-up visits to maximize her success with  intensive lifestyle modifications for her multiple health conditions.   Objective:   Blood pressure 127/78, pulse 77, temperature 98.1 F (36.7 C), height 5\' 2"  (1.575 m), weight 208 lb (94.3 kg), last menstrual period 09/14/2019, SpO2 97 %. Body mass index is 38.04  kg/m.  General: Cooperative, alert, well developed, in no acute distress. HEENT: Conjunctivae and lids unremarkable. Cardiovascular: Regular rhythm.  Lungs: Normal work of breathing. Neurologic: No focal deficits.   Lab Results  Component Value Date   CREATININE 0.74 04/20/2019   BUN 15 04/20/2019   NA 140 04/20/2019   K 4.9 04/20/2019   CL 105 04/20/2019   CO2 21 04/20/2019   Lab Results  Component Value Date   ALT 28 04/20/2019   AST 15 04/20/2019   ALKPHOS 98 04/20/2019   BILITOT 0.3 04/20/2019   Lab Results  Component Value Date   HGBA1C 5.7 (H) 04/20/2019   HGBA1C 5.6 10/14/2018   Lab Results  Component Value Date   INSULIN 17.7 04/20/2019   Lab Results  Component Value Date   TSH 1.740 10/14/2018   Lab Results  Component Value Date   CHOL 205 (H) 10/14/2018   HDL 64 10/14/2018   LDLCALC 113 (H) 10/14/2018   TRIG 141 10/14/2018   CHOLHDL 3.2 10/14/2018   Lab Results  Component Value Date   WBC 9.0 12/07/2018   HGB 12.3 12/07/2018   HCT 38.3 12/07/2018   MCV 80.1 12/07/2018   PLT 293.0 12/07/2018   No results found for: IRON, TIBC, FERRITIN  Attestation Statements:   Reviewed by clinician on day of visit: allergies, medications, problem list, medical history, surgical history, family history, social history, and previous encounter notes.  Ariana Mcdaniel Dk, am acting as Location manager for [employer], DO   I have reviewed the above documentation for accuracy and completeness, and I agree with the above. Jearld Lesch, DO

## 2019-10-30 ENCOUNTER — Other Ambulatory Visit (INDEPENDENT_AMBULATORY_CARE_PROVIDER_SITE_OTHER): Payer: Self-pay | Admitting: Bariatrics

## 2019-10-30 DIAGNOSIS — E559 Vitamin D deficiency, unspecified: Secondary | ICD-10-CM

## 2019-10-31 ENCOUNTER — Ambulatory Visit (INDEPENDENT_AMBULATORY_CARE_PROVIDER_SITE_OTHER): Payer: BC Managed Care – PPO | Admitting: Family Medicine

## 2019-10-31 ENCOUNTER — Encounter (INDEPENDENT_AMBULATORY_CARE_PROVIDER_SITE_OTHER): Payer: Self-pay | Admitting: Family Medicine

## 2019-10-31 ENCOUNTER — Other Ambulatory Visit: Payer: Self-pay

## 2019-10-31 VITALS — BP 103/69 | HR 72 | Temp 98.3°F | Ht 62.0 in | Wt 211.0 lb

## 2019-10-31 DIAGNOSIS — E559 Vitamin D deficiency, unspecified: Secondary | ICD-10-CM | POA: Diagnosis not present

## 2019-10-31 DIAGNOSIS — Z6838 Body mass index (BMI) 38.0-38.9, adult: Secondary | ICD-10-CM

## 2019-11-01 ENCOUNTER — Encounter (INDEPENDENT_AMBULATORY_CARE_PROVIDER_SITE_OTHER): Payer: Self-pay | Admitting: Family Medicine

## 2019-11-01 NOTE — Progress Notes (Signed)
Chief Complaint:   OBESITY Ariana Mcdaniel is here to discuss her progress with her obesity treatment plan along with follow-up of her obesity related diagnoses. Ariana Mcdaniel is on the Category 4 Plan and states she is following her eating plan approximately 70% of the time. Ariana Mcdaniel states she is doing 0 minutes 0 times per week.  Today's visit was #: 11 Starting weight: 239 lbs Starting date: 04/20/2019 Today's weight: 211 lbs Today's date: 10/31/2019 Total lbs lost to date: 28 Total lbs lost since last in-office visit: 0  Interim History: Ariana Mcdaniel has been forgetting her Kirke Shaggy but she has now switched to taking it in the evening. She is working 60 hours per week and she has no time to cook or meal plan.  Subjective:   1. Vitamin D deficiency Ariana Mcdaniel often forgets to take her Vit D. Last vit D level was low at 19.3.  Assessment/Plan:   1. Vitamin D deficiency Low Vitamin D level contributes to fatigue and are associated with obesity, breast, and colon cancer. Ariana Mcdaniel agreed to continue taking prescription Vitamin D 50,000 IU every week, and she is to set an alarm to remember to take it. She will follow-up for routine testing of Vitamin D, at least 2-3 times per year to avoid over-replacement. We will recheck labs in 2 months.  2. Class 2 severe obesity with serious comorbidity and body mass index (BMI) of 38.0 to 38.9 in adult, unspecified obesity type (HCC) Ariana Mcdaniel is currently in the action stage of change. As such, her goal is to continue with weight loss efforts. She has agreed to the Category 4 Plan and keeping a food journal and adhering to recommended goals of 350-450 calories and 30 grams of protein daily.   We discussed various medication options to help Ariana Mcdaniel with her weight loss efforts and we both agreed to take Saxenda 1.2 mg daily.  Exercise goals: No exercise has been prescribed at this time.  Behavioral modification strategies: increasing lean protein intake and decreasing simple  carbohydrates.  Ariana Mcdaniel has agreed to follow-up with our clinic in 3 weeks. She was informed of the importance of frequent follow-up visits to maximize her success with intensive lifestyle modifications for her multiple health conditions.   Objective:   Blood pressure 103/69, pulse 72, temperature 98.3 F (36.8 C), temperature source Oral, height 5\' 2"  (1.575 m), weight 211 lb (95.7 kg), SpO2 99 %. Body mass index is 38.59 kg/m.  General: Cooperative, alert, well developed, in no acute distress. HEENT: Conjunctivae and lids unremarkable. Cardiovascular: Regular rhythm.  Lungs: Normal work of breathing. Neurologic: No focal deficits.   Lab Results  Component Value Date   CREATININE 0.74 04/20/2019   BUN 15 04/20/2019   NA 140 04/20/2019   K 4.9 04/20/2019   CL 105 04/20/2019   CO2 21 04/20/2019   Lab Results  Component Value Date   ALT 28 04/20/2019   AST 15 04/20/2019   ALKPHOS 98 04/20/2019   BILITOT 0.3 04/20/2019   Lab Results  Component Value Date   HGBA1C 5.7 (H) 04/20/2019   HGBA1C 5.6 10/14/2018   Lab Results  Component Value Date   INSULIN 17.7 04/20/2019   Lab Results  Component Value Date   TSH 1.740 10/14/2018   Lab Results  Component Value Date   CHOL 205 (H) 10/14/2018   HDL 64 10/14/2018   LDLCALC 113 (H) 10/14/2018   TRIG 141 10/14/2018   CHOLHDL 3.2 10/14/2018   Lab Results  Component Value  Date   WBC 9.0 12/07/2018   HGB 12.3 12/07/2018   HCT 38.3 12/07/2018   MCV 80.1 12/07/2018   PLT 293.0 12/07/2018   No results found for: IRON, TIBC, FERRITIN  Attestation Statements:   Reviewed by clinician on day of visit: allergies, medications, problem list, medical history, surgical history, family history, social history, and previous encounter notes.   Wilhemena Durie, am acting as Location manager for [employer], FNP-C.  I have reviewed the above documentation for accuracy and completeness, and I agree with the above. - Georgianne Fick, FNP

## 2019-11-08 ENCOUNTER — Encounter (INDEPENDENT_AMBULATORY_CARE_PROVIDER_SITE_OTHER): Payer: Self-pay

## 2019-11-08 NOTE — Telephone Encounter (Signed)
Sent pt a Adult nurse

## 2019-11-21 ENCOUNTER — Ambulatory Visit (INDEPENDENT_AMBULATORY_CARE_PROVIDER_SITE_OTHER): Payer: BC Managed Care – PPO | Admitting: Family Medicine

## 2019-11-21 ENCOUNTER — Encounter (INDEPENDENT_AMBULATORY_CARE_PROVIDER_SITE_OTHER): Payer: Self-pay | Admitting: Family Medicine

## 2019-11-21 ENCOUNTER — Other Ambulatory Visit: Payer: Self-pay

## 2019-11-21 VITALS — BP 106/70 | HR 79 | Temp 98.3°F | Ht 62.0 in | Wt 213.0 lb

## 2019-11-21 DIAGNOSIS — Z6838 Body mass index (BMI) 38.0-38.9, adult: Secondary | ICD-10-CM | POA: Diagnosis not present

## 2019-11-21 DIAGNOSIS — E559 Vitamin D deficiency, unspecified: Secondary | ICD-10-CM | POA: Diagnosis not present

## 2019-11-21 DIAGNOSIS — Z9189 Other specified personal risk factors, not elsewhere classified: Secondary | ICD-10-CM | POA: Diagnosis not present

## 2019-11-21 DIAGNOSIS — F3289 Other specified depressive episodes: Secondary | ICD-10-CM | POA: Diagnosis not present

## 2019-11-21 MED ORDER — SAXENDA 18 MG/3ML ~~LOC~~ SOPN: 3.0000 mg | PEN_INJECTOR | Freq: Every day | SUBCUTANEOUS | 0 refills | Status: DC

## 2019-11-21 MED ORDER — VITAMIN D (ERGOCALCIFEROL) 1.25 MG (50000 UNIT) PO CAPS: 50000.0000 [IU] | ORAL_CAPSULE | ORAL | 0 refills | Status: DC

## 2019-11-21 MED ORDER — BD PEN NEEDLE NANO 2ND GEN 32G X 4 MM MISC: 0 refills | Status: DC

## 2019-11-22 NOTE — Progress Notes (Signed)
Chief Complaint:   OBESITY Ariana Mcdaniel is here to discuss her progress with her obesity treatment plan along with follow-up of her obesity related diagnoses. Ariana Mcdaniel is on the Category 4 Plan and keeping a food journal and adhering to recommended goals of 350-450 calories and 30 grams of protein daily and states she is following her eating plan approximately 80% of the time. Ariana Mcdaniel states she is doing 0 minutes 0 times per week.  Today's visit was #: 12 Starting weight: 239 lbs Starting date: 04/20/2019 Today's weight: 213 lbs Today's date: 11/21/2019 Total lbs lost to date: 26 Total lbs lost since last in-office visit: 0  Interim History: Ariana Mcdaniel is on Saxenda 1.2 mg daily. She is now remembering to take it regularly. She notes constipation. Shedenies nausea. She notes some cravings rather than hunger. She is on the plan at breakfast and lunch.   Subjective:   1. Vitamin D deficiency Grizel's Vit D level is low at 19.3. She is on prescription Vit D.  2. Other depression, with emotional eating  Ariana Mcdaniel notes cravings. She has hives with bupropion in the past.  3. At risk for side effect of medication Ariana Mcdaniel id at risk for drug side effects due to increased dose of Saxenda.  Assessment/Plan:   1. Vitamin D deficiency Low Vitamin D level contributes to fatigue and are associated with obesity, breast, and colon cancer. We will refill prescription Vitamin D for 1 month, and we will recheck her Vit D level at her next office visit. [name] will follow-up for routine testing of Vitamin D, at least 2-3 times per year to avoid over-replacement.  - Vitamin D, Ergocalciferol, (DRISDOL) 1.25 MG (50000 UNIT) CAPS capsule; Take 1 capsule (50,000 Units total) by mouth every 7 (seven) days.  Dispense: 4 capsule; Refill: 0  2. Other depression, with emotional eating  Behavior modification techniques were discussed today to help Taci deal with her emotional/non-hunger eating behaviors. We will  continue to titrate dose of Saxenda.  3. At risk for side effect of medication Ariana Mcdaniel was given approximately 15 minutes of drug side effect counseling today regarding risk of constipation and nausea as we increase dose of Saxenda. . We discussed side effect possibility and risk versus benefits. Ariana Mcdaniel agreed to the medication and will contact this office if these side effects are intolerable.  Repetitive spaced learning was employed today to elicit superior memory formation and behavioral change.  4. Class 2 severe obesity with serious comorbidity and body mass index (BMI) of 38.0 to 38.9 in adult, unspecified obesity type (HCC) Ariana Mcdaniel is currently in the action stage of change. As such, her goal is to continue with weight loss efforts. She has agreed to the Category 4 Plan.   We both agreed to increase Saxenda to 1.5 mg (1.2 mg plus 5 clicks), and we will refill Saxenda for 1 month and nano needles #100 with no refill.  - Liraglutide -Weight Management (SAXENDA) 18 MG/3ML SOPN; Inject 3 mg into the skin daily.  Dispense: 15 mL; Refill: 0 - Insulin Pen Needle (BD PEN NEEDLE NANO 2ND GEN) 32G X 4 MM MISC; Use 1 needle daily to inject Saxenda.  Dispense: 50 each; Refill: 0  Exercise goals: No exercise has been prescribed at this time.  Behavioral modification strategies: increasing lean protein intake, decreasing simple carbohydrates, meal planning and cooking strategies and avoiding temptations.  Ariana Mcdaniel has agreed to follow-up with our clinic in 2 to 3 weeks.  Objective:   Blood pressure 106/70,  pulse 79, temperature 98.3 F (36.8 C), temperature source Oral, height 5\' 2"  (1.575 m), weight 213 lb (96.6 kg), SpO2 97 %. Body mass index is 38.96 kg/m.  General: Cooperative, alert, well developed, in no acute distress. HEENT: Conjunctivae and lids unremarkable. Cardiovascular: Regular rhythm.  Lungs: Normal work of breathing. Neurologic: No focal deficits.   Lab Results  Component  Value Date   CREATININE 0.74 04/20/2019   BUN 15 04/20/2019   NA 140 04/20/2019   K 4.9 04/20/2019   CL 105 04/20/2019   CO2 21 04/20/2019   Lab Results  Component Value Date   ALT 28 04/20/2019   AST 15 04/20/2019   ALKPHOS 98 04/20/2019   BILITOT 0.3 04/20/2019   Lab Results  Component Value Date   HGBA1C 5.7 (H) 04/20/2019   HGBA1C 5.6 10/14/2018   Lab Results  Component Value Date   INSULIN 17.7 04/20/2019   Lab Results  Component Value Date   TSH 1.740 10/14/2018   Lab Results  Component Value Date   CHOL 205 (H) 10/14/2018   HDL 64 10/14/2018   LDLCALC 113 (H) 10/14/2018   TRIG 141 10/14/2018   CHOLHDL 3.2 10/14/2018   Lab Results  Component Value Date   WBC 9.0 12/07/2018   HGB 12.3 12/07/2018   HCT 38.3 12/07/2018   MCV 80.1 12/07/2018   PLT 293.0 12/07/2018   No results found for: IRON, TIBC, FERRITIN  Attestation Statements:   Reviewed by clinician on day of visit: allergies, medications, problem list, medical history, surgical history, family history, social history, and previous encounter notes.   Wilhemena Durie, am acting as Location manager for [employer], FNP-C.  I have reviewed the above documentation for accuracy and completeness, and I agree with the above. -  Georgianne Fick, FNP

## 2019-12-01 ENCOUNTER — Encounter (INDEPENDENT_AMBULATORY_CARE_PROVIDER_SITE_OTHER): Payer: Self-pay

## 2019-12-12 ENCOUNTER — Other Ambulatory Visit: Payer: Self-pay

## 2019-12-12 ENCOUNTER — Encounter (INDEPENDENT_AMBULATORY_CARE_PROVIDER_SITE_OTHER): Payer: Self-pay | Admitting: Family Medicine

## 2019-12-12 ENCOUNTER — Ambulatory Visit (INDEPENDENT_AMBULATORY_CARE_PROVIDER_SITE_OTHER): Payer: BC Managed Care – PPO | Admitting: Family Medicine

## 2019-12-12 VITALS — BP 111/72 | HR 73 | Temp 98.1°F | Ht 62.0 in | Wt 211.0 lb

## 2019-12-12 DIAGNOSIS — R7303 Prediabetes: Secondary | ICD-10-CM

## 2019-12-12 DIAGNOSIS — E559 Vitamin D deficiency, unspecified: Secondary | ICD-10-CM | POA: Diagnosis not present

## 2019-12-12 DIAGNOSIS — E7849 Other hyperlipidemia: Secondary | ICD-10-CM | POA: Insufficient documentation

## 2019-12-12 DIAGNOSIS — Z6838 Body mass index (BMI) 38.0-38.9, adult: Secondary | ICD-10-CM

## 2019-12-12 NOTE — Progress Notes (Signed)
Chief Complaint:   OBESITY Ariana Mcdaniel is here to discuss her progress with her obesity treatment plan along with follow-up of her obesity related diagnoses. Ariana Mcdaniel is on the Category 4 Plan and states she is following her eating plan approximately 70% of the time. Ariana Mcdaniel states she is walking for 30-45 minutes 3-5 times per week.  Today's visit was #: 13 Starting weight: 239 lbs Starting date: 04/20/2019 Today's weight: 211 lbs Today's date: 12/12/2019 Total lbs lost to date: 28 Total lbs lost since last in-office visit: 2  Interim History: Ariana Mcdaniel's hunger is well managed with Saxenda. She traveled recently but still did well on the plan. She is getting in most of the food. She denies constipation or nausea.  Subjective:   1. Pre-diabetes  Last A1c was 5.7. She is on Saxenda. Lab Results  Component Value Date   HGBA1C 5.7 (H) 04/20/2019   Lab Results  Component Value Date   INSULIN 17.7 04/20/2019   2. Other hyperlipidemia Ariana Mcdaniel is not on statin. Last LDL was elevated at 113, HDL was low at 34, and triglycerides were within normal limits. She has a history of elevated triglycerides.   Lab Results  Component Value Date   ALT 28 04/20/2019   AST 15 04/20/2019   ALKPHOS 98 04/20/2019   BILITOT 0.3 04/20/2019   Lab Results  Component Value Date   CHOL 205 (H) 10/14/2018   HDL 64 10/14/2018   LDLCALC 113 (H) 10/14/2018   TRIG 141 10/14/2018   CHOLHDL 3.2 10/14/2018   3. Vitamin D deficiency Ariana Mcdaniel's last Vit D level was low at 19.3. She is on weekly prescription Vit D.  Assessment/Plan:   1. Pre-diabetes We will check labs today. Continue Saxenda.  - Comprehensive metabolic panel - Hemoglobin A1c - Insulin, random  2. Other hyperlipidemia  We will check labs today.   3. Vitamin D deficiency  Zyriah agreed to continue taking prescription Vitamin D 50,000 IU every week, and we will check labs today.   - VITAMIN D 25 Hydroxy (Vit-D Deficiency,  Fractures)  4. Class 2 severe obesity with serious comorbidity and body mass index (BMI) of 38.0 to 38.9 in adult, unspecified obesity type (HCC) Ariana Mcdaniel is currently in the action stage of change. As such, her goal is to continue with weight loss efforts. She has agreed to the Category 4 Plan.   We both agreed to continue Saxenda at 1.5 mg.  Exercise goals: As is.  Behavioral modification strategies: increasing lean protein intake.  Ariana Mcdaniel has agreed to follow-up with our clinic in 4 weeks. Ariana Mcdaniel was informed we would discuss her lab results at her next visit unless there is a critical issue that needs to be addressed sooner. Ariana Mcdaniel agreed to keep her next visit at the agreed upon time to discuss these results.  Objective:   Blood pressure 111/72, pulse 73, temperature 98.1 F (36.7 C), temperature source Oral, height 5\' 2"  (1.575 m), weight 211 lb (95.7 kg), SpO2 99 %. Body mass index is 38.59 kg/m.  General: Cooperative, alert, well developed, in no acute distress. HEENT: Conjunctivae and lids unremarkable. Cardiovascular: Regular rhythm.  Lungs: Normal work of breathing. Neurologic: No focal deficits.   Lab Results  Component Value Date   CREATININE 0.74 04/20/2019   BUN 15 04/20/2019   NA 140 04/20/2019   K 4.9 04/20/2019   CL 105 04/20/2019   CO2 21 04/20/2019   Lab Results  Component Value Date   ALT 28 04/20/2019  AST 15 04/20/2019   ALKPHOS 98 04/20/2019   BILITOT 0.3 04/20/2019   Lab Results  Component Value Date   HGBA1C 5.7 (H) 04/20/2019   HGBA1C 5.6 10/14/2018   Lab Results  Component Value Date   INSULIN 17.7 04/20/2019   Lab Results  Component Value Date   TSH 1.740 10/14/2018   Lab Results  Component Value Date   CHOL 205 (H) 10/14/2018   HDL 64 10/14/2018   LDLCALC 113 (H) 10/14/2018   TRIG 141 10/14/2018   CHOLHDL 3.2 10/14/2018   Lab Results  Component Value Date   WBC 9.0 12/07/2018   HGB 12.3 12/07/2018   HCT 38.3 12/07/2018    MCV 80.1 12/07/2018   PLT 293.0 12/07/2018   No results found for: IRON, TIBC, FERRITIN  Attestation Statements:   Reviewed by clinician on day of visit: allergies, medications, problem list, medical history, surgical history, family history, social history, and previous encounter notes.   Wilhemena Durie, am acting as Location manager for [employer], FNP-C.  I have reviewed the above documentation for accuracy and completeness, and I agree with the above. -  Georgianne Fick, FNP

## 2019-12-13 ENCOUNTER — Encounter (INDEPENDENT_AMBULATORY_CARE_PROVIDER_SITE_OTHER): Payer: Self-pay | Admitting: Family Medicine

## 2019-12-13 LAB — LIPID PANEL WITH LDL/HDL RATIO
Cholesterol, Total: 175 mg/dL (ref 100–199)
HDL: 57 mg/dL (ref >39)
LDL Chol Calc (NIH): 104 mg/dL — ABNORMAL HIGH (ref 0–99)
LDL/HDL Ratio: 1.8 ratio (ref 0.0–3.2)
Triglycerides: 76 mg/dL (ref 0–149)
VLDL Cholesterol Cal: 14 mg/dL (ref 5–40)

## 2019-12-13 LAB — COMPREHENSIVE METABOLIC PANEL
ALT: 19 IU/L (ref 0–32)
AST: 16 IU/L (ref 0–40)
Albumin/Globulin Ratio: 1.5 (ref 1.2–2.2)
Albumin: 3.7 g/dL — ABNORMAL LOW (ref 3.8–4.8)
Alkaline Phosphatase: 65 IU/L (ref 44–121)
BUN/Creatinine Ratio: 19 (ref 9–23)
BUN: 13 mg/dL (ref 6–24)
Bilirubin Total: <0.2 mg/dL (ref 0.0–1.2)
CO2: 25 mmol/L (ref 20–29)
Calcium: 8.8 mg/dL (ref 8.7–10.2)
Chloride: 105 mmol/L (ref 96–106)
Creatinine, Ser: 0.69 mg/dL (ref 0.57–1.00)
GFR calc Af Amer: 125 mL/min/{1.73_m2} (ref >59)
GFR calc non Af Amer: 108 mL/min/{1.73_m2} (ref >59)
Globulin, Total: 2.4 g/dL (ref 1.5–4.5)
Glucose: 89 mg/dL (ref 65–99)
Potassium: 4.5 mmol/L (ref 3.5–5.2)
Sodium: 140 mmol/L (ref 134–144)
Total Protein: 6.1 g/dL (ref 6.0–8.5)

## 2019-12-13 LAB — HEMOGLOBIN A1C
Est. average glucose Bld gHb Est-mCnc: 105 mg/dL
Hgb A1c MFr Bld: 5.3 % (ref 4.8–5.6)

## 2019-12-13 LAB — VITAMIN D 25 HYDROXY (VIT D DEFICIENCY, FRACTURES): Vit D, 25-Hydroxy: 45.6 ng/mL (ref 30.0–100.0)

## 2019-12-13 LAB — INSULIN, RANDOM: INSULIN: 15.2 u[IU]/mL (ref 2.6–24.9)

## 2019-12-18 ENCOUNTER — Other Ambulatory Visit (INDEPENDENT_AMBULATORY_CARE_PROVIDER_SITE_OTHER): Payer: Self-pay | Admitting: Family Medicine

## 2019-12-18 DIAGNOSIS — Z6838 Body mass index (BMI) 38.0-38.9, adult: Secondary | ICD-10-CM

## 2019-12-19 ENCOUNTER — Encounter (INDEPENDENT_AMBULATORY_CARE_PROVIDER_SITE_OTHER): Payer: Self-pay

## 2019-12-19 NOTE — Telephone Encounter (Signed)
Message sent to pt.

## 2019-12-23 ENCOUNTER — Other Ambulatory Visit: Payer: Self-pay | Admitting: Family Medicine

## 2019-12-23 ENCOUNTER — Other Ambulatory Visit: Payer: Self-pay

## 2019-12-23 ENCOUNTER — Ambulatory Visit
Admission: RE | Admit: 2019-12-23 | Discharge: 2019-12-23 | Disposition: A | Discharge status: Home / Self Care | Payer: BC Managed Care – PPO | Source: Ambulatory Visit | Attending: Family Medicine | Admitting: Family Medicine

## 2019-12-23 DIAGNOSIS — R921 Mammographic calcification found on diagnostic imaging of breast: Secondary | ICD-10-CM

## 2019-12-23 DIAGNOSIS — N6310 Unspecified lump in the right breast, unspecified quadrant: Secondary | ICD-10-CM

## 2020-01-09 ENCOUNTER — Ambulatory Visit (INDEPENDENT_AMBULATORY_CARE_PROVIDER_SITE_OTHER): Payer: BC Managed Care – PPO | Admitting: Family Medicine

## 2020-01-11 ENCOUNTER — Other Ambulatory Visit: Payer: Self-pay

## 2020-01-11 ENCOUNTER — Encounter (INDEPENDENT_AMBULATORY_CARE_PROVIDER_SITE_OTHER): Payer: Self-pay | Admitting: Family Medicine

## 2020-01-11 ENCOUNTER — Ambulatory Visit (INDEPENDENT_AMBULATORY_CARE_PROVIDER_SITE_OTHER): Payer: BC Managed Care – PPO | Admitting: Family Medicine

## 2020-01-11 VITALS — BP 101/66 | HR 91 | Temp 98.3°F | Ht 62.0 in | Wt 212.0 lb

## 2020-01-11 DIAGNOSIS — Z6838 Body mass index (BMI) 38.0-38.9, adult: Secondary | ICD-10-CM | POA: Diagnosis not present

## 2020-01-11 DIAGNOSIS — E559 Vitamin D deficiency, unspecified: Secondary | ICD-10-CM

## 2020-01-11 DIAGNOSIS — E8881 Metabolic syndrome: Secondary | ICD-10-CM | POA: Diagnosis not present

## 2020-01-11 MED ORDER — VITAMIN D (ERGOCALCIFEROL) 1.25 MG (50000 UNIT) PO CAPS: 50000.0000 [IU] | ORAL_CAPSULE | ORAL | 0 refills | Status: DC

## 2020-01-11 NOTE — Progress Notes (Signed)
The 10-year ASCVD risk score Mikey Bussing DC Brooke Bonito., et al., 2013) is: 0.3%   Values used to calculate the score:     Age: 44 years     Sex: Female     Is Non-Hispanic African American: No     Diabetic: No     Tobacco smoker: No     Systolic Blood Pressure: 482 mmHg     Is BP treated: No     HDL Cholesterol: 57 mg/dL     Total Cholesterol: 175 mg/dL

## 2020-01-12 ENCOUNTER — Encounter (INDEPENDENT_AMBULATORY_CARE_PROVIDER_SITE_OTHER): Payer: Self-pay | Admitting: Family Medicine

## 2020-01-12 DIAGNOSIS — E8881 Metabolic syndrome: Secondary | ICD-10-CM | POA: Insufficient documentation

## 2020-01-12 DIAGNOSIS — E88819 Insulin resistance, unspecified: Secondary | ICD-10-CM | POA: Insufficient documentation

## 2020-01-12 NOTE — Progress Notes (Signed)
Chief Complaint:   OBESITY Ariana Mcdaniel is here to discuss her progress with her obesity treatment plan along with follow-up of her obesity related diagnoses. Ariana Mcdaniel is on the Category 4 Plan and states she is following her eating plan approximately 60% of the time. Ariana Mcdaniel states she is doing 0 minutes 0 times per week.  Today's visit was #: 14 Starting weight: 239 lbs Starting date: 04/20/2019 Today's weight: 212 lbs Today's date: 01/11/2020 Total lbs lost to date: 27 Total lbs lost since last in-office visit: 0  Interim History: Ariana Mcdaniel is up a few lbs of water weight. She has been eating out quite a bit due to being busy in the evenings. She is on Saxenda 1.2 mg. She had forgotten to take it for a while. She would like to stop the [place] because she is tired of the daily injections.  Subjective:   1. Insulin resistance Ariana Mcdaniel is no longer pre-diabetic. Her A1c is down from 5.7 to 5.3.   Lab Results  Component Value Date   INSULIN 15.2 12/12/2019   INSULIN 17.7 04/20/2019   Lab Results  Component Value Date   HGBA1C 5.3 12/12/2019   2. Vitamin D deficiency Ariana Mcdaniel's Vit D level is at goal. She is on prescription Vit D. I discussed labs with the patient today.  Assessment/Plan:   1. Insulin resistance We will discontinue Saxenda.  2. Vitamin D deficiency  We will refill prescription Vitamin D for 1 month.   - Vitamin D, Ergocalciferol, (DRISDOL) 1.25 MG (50000 UNIT) CAPS capsule; Take 1 capsule (50,000 Units total) by mouth every 7 (seven) days.  Dispense: 4 capsule; Refill: 0  3. Class 2 severe obesity with serious comorbidity and body mass index (BMI) of 38.0 to 38.9 in adult, unspecified obesity type (HCC) Ariana Mcdaniel is currently in the action stage of change. As such, her goal is to continue with weight loss efforts. She has agreed to the Category 4 Plan.  We will consider starting Wegovy after the first of the year.   Exercise goals: No exercise has been prescribed at  this time.  Behavioral modification strategies: decreasing eating out.  Ariana Mcdaniel has agreed to follow-up with our clinic in 3 weeks.   Objective:   Blood pressure 101/66, pulse 91, temperature 98.3 F (36.8 C), height 5\' 2"  (1.575 m), weight 212 lb (96.2 kg), SpO2 97 %. Body mass index is 38.78 kg/m.  General: Cooperative, alert, well developed, in no acute distress. HEENT: Conjunctivae and lids unremarkable. Cardiovascular: Regular rhythm.  Lungs: Normal work of breathing. Neurologic: No focal deficits.   Lab Results  Component Value Date   CREATININE 0.69 12/12/2019   BUN 13 12/12/2019   NA 140 12/12/2019   K 4.5 12/12/2019   CL 105 12/12/2019   CO2 25 12/12/2019   Lab Results  Component Value Date   ALT 19 12/12/2019   AST 16 12/12/2019   ALKPHOS 65 12/12/2019   BILITOT <0.2 12/12/2019   Lab Results  Component Value Date   HGBA1C 5.3 12/12/2019   HGBA1C 5.7 (H) 04/20/2019   HGBA1C 5.6 10/14/2018   Lab Results  Component Value Date   INSULIN 15.2 12/12/2019   INSULIN 17.7 04/20/2019   Lab Results  Component Value Date   TSH 1.740 10/14/2018   Lab Results  Component Value Date   CHOL 175 12/12/2019   HDL 57 12/12/2019   LDLCALC 104 (H) 12/12/2019   TRIG 76 12/12/2019   CHOLHDL 3.2 10/14/2018   Lab  Results  Component Value Date   WBC 9.0 12/07/2018   HGB 12.3 12/07/2018   HCT 38.3 12/07/2018   MCV 80.1 12/07/2018   PLT 293.0 12/07/2018   No results found for: IRON, TIBC, FERRITIN  Attestation Statements:   Reviewed by clinician on day of visit: allergies, medications, problem list, medical history, surgical history, family history, social history, and previous encounter notes.   Wilhemena Durie, am acting as Location manager for [employer], FNP-C.  I have reviewed the above documentation for accuracy and completeness, and I agree with the above. -  Georgianne Fick, FNP

## 2020-01-17 ENCOUNTER — Ambulatory Visit
Admission: RE | Admit: 2020-01-17 | Discharge: 2020-01-17 | Disposition: A | Discharge status: Home / Self Care | Payer: BC Managed Care – PPO | Source: Ambulatory Visit | Attending: Family Medicine | Admitting: Family Medicine

## 2020-01-17 ENCOUNTER — Other Ambulatory Visit: Payer: Self-pay

## 2020-01-17 DIAGNOSIS — R921 Mammographic calcification found on diagnostic imaging of breast: Secondary | ICD-10-CM

## 2020-01-18 ENCOUNTER — Other Ambulatory Visit: Payer: Self-pay | Admitting: Family Medicine

## 2020-01-18 DIAGNOSIS — N6310 Unspecified lump in the right breast, unspecified quadrant: Secondary | ICD-10-CM

## 2020-01-27 ENCOUNTER — Ambulatory Visit
Admission: RE | Admit: 2020-01-27 | Discharge: 2020-01-27 | Disposition: A | Discharge status: Home / Self Care | Payer: BC Managed Care – PPO | Source: Ambulatory Visit | Attending: Family Medicine | Admitting: Family Medicine

## 2020-01-27 ENCOUNTER — Other Ambulatory Visit: Payer: Self-pay

## 2020-01-27 DIAGNOSIS — N6310 Unspecified lump in the right breast, unspecified quadrant: Secondary | ICD-10-CM

## 2020-01-31 ENCOUNTER — Ambulatory Visit (INDEPENDENT_AMBULATORY_CARE_PROVIDER_SITE_OTHER): Payer: BC Managed Care – PPO | Admitting: Family Medicine

## 2020-02-06 ENCOUNTER — Ambulatory Visit (INDEPENDENT_AMBULATORY_CARE_PROVIDER_SITE_OTHER): Payer: BC Managed Care – PPO | Admitting: Family Medicine

## 2020-02-06 ENCOUNTER — Other Ambulatory Visit: Payer: Self-pay

## 2020-02-06 ENCOUNTER — Encounter (INDEPENDENT_AMBULATORY_CARE_PROVIDER_SITE_OTHER): Payer: Self-pay | Admitting: Family Medicine

## 2020-02-06 VITALS — BP 101/68 | HR 75 | Temp 98.3°F | Ht 62.0 in | Wt 213.0 lb

## 2020-02-06 DIAGNOSIS — Z6839 Body mass index (BMI) 39.0-39.9, adult: Secondary | ICD-10-CM

## 2020-02-06 DIAGNOSIS — E559 Vitamin D deficiency, unspecified: Secondary | ICD-10-CM | POA: Diagnosis not present

## 2020-02-06 DIAGNOSIS — E8881 Metabolic syndrome: Secondary | ICD-10-CM

## 2020-02-06 MED ORDER — VITAMIN D (ERGOCALCIFEROL) 1.25 MG (50000 UNIT) PO CAPS: 50000.0000 [IU] | ORAL_CAPSULE | ORAL | 0 refills | Status: DC

## 2020-02-07 ENCOUNTER — Ambulatory Visit (INDEPENDENT_AMBULATORY_CARE_PROVIDER_SITE_OTHER): Payer: BC Managed Care – PPO | Admitting: Family Medicine

## 2020-02-07 ENCOUNTER — Encounter (INDEPENDENT_AMBULATORY_CARE_PROVIDER_SITE_OTHER): Payer: Self-pay | Admitting: Family Medicine

## 2020-02-07 NOTE — Progress Notes (Signed)
Chief Complaint:   OBESITY Ariana Mcdaniel is here to discuss her progress with her obesity treatment plan along with follow-up of her obesity related diagnoses. Ariana Mcdaniel is on the Category 4 Plan and states she is following her eating plan approximately 90% of the time. Ariana Mcdaniel states she is exercising 0 minutes 0 times per week.  Today's visit was #: 15 Starting weight: 239 lbs Starting date: 04/20/2019 Today's weight: 213 lbs Today's date: 02/06/2020 Total lbs lost to date: 26 Total lbs lost since last in-office visit: 0  Interim History: Ariana Mcdaniel has been off plan over the last 4 days, but is only up 1 lb. She is now back on plan. She does not like the texture of [place] yogurt so she does not eat the yogurt with lunch on plan 4. Kirke Shaggy was discontinued at her last office visit at her request (she did not like daily injections) and she does not feel excessively hungry.  Subjective:   Vitamin D deficiency. Vitamin D level was low at 45.6 on 12/12/2019. Ariana Mcdaniel is on weekly prescription Vitamin D.   Ref. Range 12/12/2019 08:14  Vitamin D, 25-Hydroxy Latest Ref Range: 30.0 - 100.0 ng/mL 45.6   Insulin resistance. Tea has a diagnosis of insulin resistance based on her elevated fasting insulin level >5. Ariana Mcdaniel was previously prediabetic; A1c is now 5.3, down from 5.7. She denies polyphagia.  Lab Results  Component Value Date   INSULIN 15.2 12/12/2019   INSULIN 17.7 04/20/2019   Lab Results  Component Value Date   HGBA1C 5.3 12/12/2019   Assessment/Plan:   Vitamin D deficiency. She was given a refill on her Vitamin D, Ergocalciferol, (DRISDOL) 1.25 MG (50000 UNIT) CAPS capsule every week #4 with 0 refills and will follow-up for routine testing of Vitamin D, at least 2-3 times per year to avoid over-replacement.   Insulin resistance.  Ariana Mcdaniel agreed to follow-up with [place] as directed to closely monitor her progress. She will continue her meal plan as directed.   Class 2 severe  obesity with serious comorbidity and body mass index (BMI) of 39.0 to 39.9 in adult, unspecified obesity type ([place]).  Ariana Mcdaniel is currently in the action stage of change. As such, her goal is to continue with weight loss efforts. She has agreed to the Category 4 Plan.   We discussed substitutions for [place] yogurt.  Handout was provided on Protein Equivalents. We may consider starting Wegovy in January.  Exercise goals: No exercise has been prescribed at this time.  Behavioral modification strategies: increasing lean protein intake and decreasing simple carbohydrates.  Ariana Mcdaniel has agreed to follow-up with our clinic in 3 weeks.   Objective:   Blood pressure 101/68, pulse 75, temperature 98.3 F (36.8 C), height 5\' 2"  (1.575 m), weight 213 lb (96.6 kg), SpO2 97 %. Body mass index is 38.96 kg/m.  General: Cooperative, alert, well developed, in no acute distress. HEENT: Conjunctivae and lids unremarkable. Cardiovascular: Regular rhythm.  Lungs: Normal work of breathing. Neurologic: No focal deficits.   Lab Results  Component Value Date   CREATININE 0.69 12/12/2019   BUN 13 12/12/2019   NA 140 12/12/2019   K 4.5 12/12/2019   CL 105 12/12/2019   CO2 25 12/12/2019   Lab Results  Component Value Date   ALT 19 12/12/2019   AST 16 12/12/2019   ALKPHOS 65 12/12/2019   BILITOT <0.2 12/12/2019   Lab Results  Component Value Date   HGBA1C 5.3 12/12/2019   HGBA1C 5.7 (  H) 04/20/2019   HGBA1C 5.6 10/14/2018   Lab Results  Component Value Date   INSULIN 15.2 12/12/2019   INSULIN 17.7 04/20/2019   Lab Results  Component Value Date   TSH 1.740 10/14/2018   Lab Results  Component Value Date   CHOL 175 12/12/2019   HDL 57 12/12/2019   LDLCALC 104 (H) 12/12/2019   TRIG 76 12/12/2019   CHOLHDL 3.2 10/14/2018   Lab Results  Component Value Date   WBC 9.0 12/07/2018   HGB 12.3 12/07/2018   HCT 38.3 12/07/2018   MCV 80.1 12/07/2018   PLT 293.0 12/07/2018   No results  found for: IRON, TIBC, FERRITIN  Attestation Statements:   Reviewed by clinician on day of visit: allergies, medications, problem list, medical history, surgical history, family history, social history, and previous encounter notes.  IMichaelene Song, am acting as Location manager for [employer], FNP-C   I have reviewed the above documentation for accuracy and completeness, and I agree with the above. -  Georgianne Fick, FNP

## 2020-02-20 ENCOUNTER — Ambulatory Visit: Payer: Self-pay | Admitting: Surgery

## 2020-02-20 DIAGNOSIS — N6320 Unspecified lump in the left breast, unspecified quadrant: Secondary | ICD-10-CM

## 2020-02-20 NOTE — H&P (Signed)
Ariana Mcdaniel Appointment: 02/20/2020 1:50 PM Location: [facility] Patient #: [id number] DOB: [birth year] Married / Language: Ariana Mcdaniel / Race: White Female  History of Present Illness Ariana Mcdaniel A. Alaisha Eversley MD; 02/20/2020 3:23 PM) Patient words: Patient sent at the request of Dr. Isaiah Blakes due to abnormal mammography. She had married in each breast in the upper quadrant which showed atypical density/mass. Core biopsy right showed fibroadenoma and cold biopsy of left showed flat atypia. She has no complaints of breast mass, breast pain, nipple discharge or any change either breast. She had a great maternal aunt with breast cancer but no other family history of breast cancer noted.    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  EXAM: DIGITAL DIAGNOSTIC BILATERAL MAMMOGRAM WITH CAD AND TOMO  ULTRASOUND RIGHT BREAST  COMPARISON: Previous exam(s).  ACR Breast Density Category b: There are scattered areas of fibroglandular density.  FINDINGS: Mammogram:  Right breast: There is a stable mass in the upper outer right breast best seen on the MLO view measuring 0.7 cm. The originally seen asymmetry in the medial aspect of the right breast does not persist again today. No new suspicious mass, distortion, or microcalcifications are identified to suggest presence of malignancy.  Left breast: Spot 2D magnification views were performed in addition to standard views for calcifications in the outer left breast. On the spot magnification views there is a group of punctate calcifications in the lower outer left breast spanning 0.8 cm. No associated  suspicious mass or asymmetry.  Mammographic images were processed with CAD.  Ultrasound:  Targeted ultrasound is performed in the right breast at 9 o'clock 4 cm from the nipple demonstrating a cluster of anechoic masses measuring 1.1 x 0.5 x 0.9 cm, previously measuring 1.1 x 0.5 x 0.7 cm. At 10 o'clock 6 cm from the nipple there is an oval circumscribed hypoechoic mass measuring 0.7 x 0.5 x 1.0 cm, previously measuring 0.7 x 0.4 x 1.0 cm.  IMPRESSION: 1. Indeterminate calcifications in the lower outer LEFT breast spanning 0.8 cm.  2. Stable benign cluster of cysts in the RIGHT breast at 9 o'clock.  3. Stable probably benign mass in the RIGHT breast at 10 o'clock possibly representing a complicated cyst or solid mass.  RECOMMENDATION: 1. Stereotactic core needle biopsy of the left breast calcifications.  2. If the biopsy returns as a benign pathology, recommend diagnostic bilateral mammogram and right breast ultrasound in 1 year to confirm 2 year stability of the probably benign right breast mass. If the biopsy returns as atypia or malignancy recommend ultrasound-guided core needle biopsy of the probably benign right breast mass at 10 o'clock.  The patient will be scheduled for biopsy prior to leaving our office today.  BI-RADS CATEGORY 4: Suspicious.   Electronically Signed By: Audie Pinto M.D. On: 12/23/2019 08:40.  The patient is a 44 year old female.   Past Surgical History Darden Palmer, [place]; 02/20/2020 2:10 PM) Oral Surgery Tonsillectomy  Diagnostic Studies History Darden Palmer, [place]; 02/20/2020 2:10 PM) Colonoscopy never Mammogram within last year Pap Smear 1-5 years ago  Allergies Darden Palmer, RMA; 02/20/2020 2:14 PM) Amoxicillin *PENICILLINS* Clindamycin HCl *CHEMICALS* oxyCODONE HCl *ANALGESICS - OPIOID* Allergies Reconciled  Medication History Darden Palmer, [place]; 02/20/2020 2:15 PM) Omeprazole (20MG  Tablet DR, Oral)  Active. Vitamin D3 (1.25 MG(50000 UT) Capsule, Oral) Active. Medications Reconciled  Social History Darden Palmer, [place]; 02/20/2020 2:10 PM) Alcohol use Occasional alcohol use. Caffeine use Carbonated beverages. No drug use Tobacco use Former smoker.  Family History Darden Palmer, [place]; 02/20/2020 2:10 PM) Breast Cancer Family Members In General. Depression Family Members In General. Hypertension Family Members In General. Thyroid problems Family Members In General, Mother.  Pregnancy / Birth History Darden Palmer, [place]; 02/20/2020 2:10 PM) Age at menarche 18 years. Gravida 2 Length (months) of breastfeeding 3-6 Maternal age 62-25 Para 2 Regular periods  Other Problems Darden Palmer, [place]; 02/20/2020 2:10 PM) Gastroesophageal Reflux Disease Kidney Stone Lump In Breast Pulmonary Embolism / Blood Clot in Legs     Review of Systems Darden Palmer RMA; 02/20/2020 2:10 PM) General Not Present- Appetite Loss, Chills, Fatigue, Fever, Night Sweats, Weight Gain and Weight Loss. Skin Not Present- Change in Wart/Mole, Dryness, Hives, Jaundice, New Lesions, Non-Healing Wounds, Rash and Ulcer. HEENT Present- Seasonal Allergies. Not Present- Earache, Hearing Loss, Hoarseness, Nose Bleed, Oral Ulcers, Ringing in the Ears, Sinus Pain, Sore Throat, Visual Disturbances, Wears glasses/contact lenses and Yellow Eyes. Respiratory Not Present- Bloody sputum, Chronic Cough, Difficulty Breathing, Snoring and Wheezing. Breast Present- Breast Mass. Not Present- Breast Pain, Nipple Discharge and Skin Changes. Cardiovascular Not Present- Chest Pain, Difficulty Breathing Lying Down, Leg Cramps, Palpitations, Rapid Heart Rate, Shortness of Breath and Swelling of Extremities. Gastrointestinal Not Present- Abdominal Pain, Bloating, Bloody Stool, Change in Bowel Habits, Chronic diarrhea, Constipation, Difficulty Swallowing, Excessive gas, Gets full quickly at meals, Hemorrhoids,  Indigestion, Nausea, Rectal Pain and Vomiting. Female Genitourinary Not Present- Frequency, Nocturia, Painful Urination, Pelvic Pain and Urgency. Musculoskeletal Not Present- Back Pain, Joint Pain, Joint Stiffness, Muscle Pain, Muscle Weakness and Swelling of Extremities. Neurological Not Present- Decreased Memory, Fainting, Headaches, Numbness, Seizures, Tingling, Tremor, Trouble walking and Weakness. Psychiatric Not Present- Anxiety, Bipolar, Change in Sleep Pattern, Depression, Fearful and Frequent crying. Endocrine Not Present- Cold Intolerance, Excessive Hunger, Hair Changes, Heat Intolerance, Hot flashes and New Diabetes. Hematology Not Present- Blood Thinners, Easy Bruising, Excessive bleeding, Gland problems, HIV and Persistent Infections.  Vitals Lattie Haw N[place]ita RMA; 02/20/2020 2:16 PM) 02/20/2020 2:15 PM Weight: 218.38 lb Height: 62in Body Surface Area: 1.98 m Body Mass Index: 39.94 kg/m  Temp.: 98.60F  Pulse: 93 (Regular)  P.OX: 97% (Room air) BP: 106/72(Sitting, Left Arm, Standard)        Physical Exam (Deen Deguia A. Arizbeth Cawthorn MD; 02/20/2020 3:23 PM)  General Mental Status-Alert. General Appearance-Consistent with stated age. Hydration-Well hydrated. Voice-Normal.  Head and Neck Head-normocephalic, atraumatic with no lesions or palpable masses. Trachea-midline. Thyroid Gland Characteristics - normal size and consistency.  Eye Eyeball - Bilateral-Extraocular movements intact. Sclera/Conjunctiva - Bilateral-No scleral icterus.  Chest and Lung Exam Chest and lung exam reveals -quiet, even and easy respiratory effort with no use of accessory muscles and on auscultation, normal breath sounds, no adventitious sounds and normal vocal resonance. Inspection Chest Wall - Normal. Back - normal.  Breast Breast - Left-Symmetric, Non Tender, No Biopsy scars, no Dimpling - Left, No Inflammation, No Lumpectomy scars, No Mastectomy scars, No Peau d'  Orange. Breast - Right-Symmetric, Non Tender, No Biopsy scars, no Dimpling - Right, No Inflammation, No Lumpectomy scars, No Mastectomy scars, No Peau d' Orange. Breast Lump-No Palpable Breast Mass.  Cardiovascular Cardiovascular examination reveals -normal heart sounds, regular rate and rhythm with no murmurs and normal pedal pulses  bilaterally.  Abdomen Inspection Inspection of the abdomen reveals - No Hernias. Skin - Scar - no surgical scars. Palpation/Percussion Palpation and Percussion of the abdomen reveal - Soft, Non Tender, No Rebound tenderness, No Rigidity (guarding) and No hepatosplenomegaly. Auscultation Auscultation of the abdomen reveals - Bowel sounds normal.  Neurologic Neurologic evaluation reveals -alert and oriented x 3 with no impairment of recent or remote memory. Mental Status-Normal.  Musculoskeletal Normal Exam - Left-Upper Extremity Strength Normal and Lower Extremity Strength Normal. Normal Exam - Right-Upper Extremity Strength Normal and Lower Extremity Strength Normal.  Lymphatic Head & Neck  General Head & Neck Lymphatics: Bilateral - Description - Normal. Axillary  General Axillary Region: Bilateral - Description - Normal. Tenderness - Non Tender. Femoral & Inguinal  Generalized Femoral & Inguinal Lymphatics: Bilateral - Description - Normal. Tenderness - Non Tender.    Assessment & Plan (Khrystyne Arpin A. Manila Rommel MD; 02/20/2020 3:23 PM)  MASS OF LEFT BREAST ON MAMMOGRAM (N63.20) Impression: Flat atypia noted on core biopsy. Discussed the pros and cons of lumpectomy but recommend left breast seed localized due to atypia. Risks and benefits discussed. Long-term expectations discussed. Risk of lumpectomy include bleeding, infection, seroma, more surgery, use of seed/wire, wound care, cosmetic deformity and the need for other treatments, death , blood clots, death. Pt agrees to proceed.  Current Plans Pt Education - [facility] Breast Biopsy HCI:  discussed with patient and provided information. The anatomy and the physiology was discussed. The pathophysiology and natural history of the disease was discussed. Options were discussed and recommendations were made. Technique, risks, benefits, & alternatives were discussed. Risks such as stroke, heart attack, bleeding, indection, death, and other risks discussed. Questions answered. The patient agrees to proceed.  BREAST FIBROADENOMA, RIGHT (D24.1) Impression: No further intervention at this time required. Pathophysiology of fibroadenoma discussed with the patient as well as the pros and cons of surgery versus observation. Given the benign nature, she chooses to observe it for now.  Current Plans Pt Education - [facility] Free Text Education/Instructions: discussed with patient and provided information.

## 2020-02-27 ENCOUNTER — Encounter (INDEPENDENT_AMBULATORY_CARE_PROVIDER_SITE_OTHER): Payer: Self-pay

## 2020-02-28 ENCOUNTER — Other Ambulatory Visit: Payer: Self-pay

## 2020-02-28 ENCOUNTER — Ambulatory Visit (INDEPENDENT_AMBULATORY_CARE_PROVIDER_SITE_OTHER): Payer: BC Managed Care – PPO | Admitting: Family Medicine

## 2020-02-28 ENCOUNTER — Encounter (INDEPENDENT_AMBULATORY_CARE_PROVIDER_SITE_OTHER): Payer: Self-pay | Admitting: Family Medicine

## 2020-02-28 VITALS — BP 127/71 | HR 81 | Temp 98.0°F | Ht 62.0 in | Wt 217.0 lb

## 2020-02-28 DIAGNOSIS — E559 Vitamin D deficiency, unspecified: Secondary | ICD-10-CM

## 2020-02-28 DIAGNOSIS — Z6839 Body mass index (BMI) 39.0-39.9, adult: Secondary | ICD-10-CM

## 2020-02-28 DIAGNOSIS — Z9189 Other specified personal risk factors, not elsewhere classified: Secondary | ICD-10-CM | POA: Diagnosis not present

## 2020-02-28 DIAGNOSIS — E8881 Metabolic syndrome: Secondary | ICD-10-CM | POA: Diagnosis not present

## 2020-02-28 MED ORDER — VITAMIN D (ERGOCALCIFEROL) 1.25 MG (50000 UNIT) PO CAPS: 50000.0000 [IU] | ORAL_CAPSULE | ORAL | 0 refills | Status: DC

## 2020-02-28 MED ORDER — INSULIN PEN NEEDLE 32G X 4 MM MISC: 0 refills | Status: DC

## 2020-02-28 NOTE — Progress Notes (Signed)
Chief Complaint:   OBESITY Ariana Mcdaniel is here to discuss her progress with her obesity treatment plan along with follow-up of her obesity related diagnoses. [name] is on the Category 4 Plan and states she is following her eating plan approximately 60% of the time. [name] states she is not exercising regularly.  Today's visit was #: 16 Starting weight: 239 lbs Starting date: 04/20/2019 Today's weight: 217 lbs Today's date: 02/28/2020 Total lbs lost to date: 22 lbs Total lbs lost since last in-office visit: 0  Interim History: [name] says she has been struggling to stick to plan and restarted Saxenda yesterday.   She notes she has been out of control with her eating recently. She is taking 0.6 mg daily. She is tolerating it well.  She is traveling to [place] to visit family Dec. 30 but feels she will be able to do fairly well on the meal plan.   Subjective:   1. Vitamin D deficiency Vitamin D is slightly low at 45.6 on weekly prescription vitamin D.  2. Insulin resistance Pamel is not on metformin.  She notes excessive hunger.  Lab Results  Component Value Date   INSULIN 15.2 12/12/2019   INSULIN 17.7 04/20/2019   Lab Results  Component Value Date   HGBA1C 5.3 12/12/2019   3. At risk for side effect of medication [name] is at risk for side effects due to increasing dose of Saxenda.  Assessment/Plan:   1. Vitamin D deficiency Will refill vitamin D 50,000 IU weekly today, as per below.  - Vitamin D, Ergocalciferol, (DRISDOL) 1.25 MG (50000 UNIT) CAPS capsule; Take 1 capsule (50,000 Units total) by mouth every 7 (seven) days.  Dispense: 4 capsule; Refill: 0  2. Insulin resistance Restart Saxenda.  3. At risk for side effect of medication [name] was given approximately 15 minutes of drug side effect counseling today.  We discussed side effect possibility and risk versus benefits. [name] agreed to the medication and will contact this office if these side effects are  intolerable.  Repetitive spaced learning was employed today to elicit superior memory formation and behavioral change.  4. Class 2 severe obesity with serious comorbidity and body mass index (BMI) of 39.0 to 39.9 in adult, unspecified obesity type (HCC)  - Insulin Pen Needle 32G X 4 MM MISC; Use 1 needle daily to inject Saxenda.  Dispense: 100 each; Refill: 0  [name] is currently in the action stage of change. As such, her goal is to continue with weight loss efforts. She has agreed to the Category 4 Plan.   Restart Saxenda.  She will increase to 0.9 mg in 1 week if no nausea.  May increase to 1.2 mg after that.  Exercise goals: No exercise has been prescribed at this time.  Behavioral modification strategies: increasing lean protein intake and decreasing simple carbohydrates.  [name] has agreed to follow-up with our clinic in 3 weeks.  Objective:   Blood pressure 127/71, pulse 81, temperature 98 F (36.7 C), height 5\' 2"  (1.575 m), weight 217 lb (98.4 kg), SpO2 98 %. Body mass index is 39.69 kg/m.  General: Cooperative, alert, well developed, in no acute distress. HEENT: Conjunctivae and lids unremarkable. Cardiovascular: Regular rhythm.  Lungs: Normal work of breathing. Neurologic: No focal deficits.   Lab Results  Component Value Date   CREATININE 0.69 12/12/2019   BUN 13 12/12/2019   NA 140 12/12/2019   K 4.5 12/12/2019   CL 105 12/12/2019   CO2 25 12/12/2019  Lab Results  Component Value Date   ALT 19 12/12/2019   AST 16 12/12/2019   ALKPHOS 65 12/12/2019   BILITOT <0.2 12/12/2019   Lab Results  Component Value Date   HGBA1C 5.3 12/12/2019   HGBA1C 5.7 (H) 04/20/2019   HGBA1C 5.6 10/14/2018   Lab Results  Component Value Date   INSULIN 15.2 12/12/2019   INSULIN 17.7 04/20/2019   Lab Results  Component Value Date   TSH 1.740 10/14/2018   Lab Results  Component Value Date   CHOL 175 12/12/2019   HDL 57 12/12/2019   LDLCALC 104 (H) 12/12/2019    TRIG 76 12/12/2019   CHOLHDL 3.2 10/14/2018   Lab Results  Component Value Date   WBC 9.0 12/07/2018   HGB 12.3 12/07/2018   HCT 38.3 12/07/2018   MCV 80.1 12/07/2018   PLT 293.0 12/07/2018   Attestation Statements:   Reviewed by clinician on day of visit: allergies, medications, problem list, medical history, surgical history, family history, social history, and previous encounter notes.  I, Water quality scientist, CMA, am acting as Location manager for [employer], [place].  I have reviewed the above documentation for accuracy and completeness, and I agree with the above. -  Georgianne Fick, FNP

## 2020-02-29 ENCOUNTER — Encounter (INDEPENDENT_AMBULATORY_CARE_PROVIDER_SITE_OTHER): Payer: Self-pay | Admitting: Family Medicine

## 2020-03-13 ENCOUNTER — Other Ambulatory Visit: Payer: Self-pay | Admitting: Surgery

## 2020-03-13 DIAGNOSIS — N6320 Unspecified lump in the left breast, unspecified quadrant: Secondary | ICD-10-CM

## 2020-03-20 ENCOUNTER — Encounter (INDEPENDENT_AMBULATORY_CARE_PROVIDER_SITE_OTHER): Payer: Self-pay | Admitting: Family Medicine

## 2020-03-20 ENCOUNTER — Telehealth (INDEPENDENT_AMBULATORY_CARE_PROVIDER_SITE_OTHER): Payer: Self-pay | Admitting: Family Medicine

## 2020-03-20 DIAGNOSIS — E8881 Metabolic syndrome: Secondary | ICD-10-CM

## 2020-03-20 DIAGNOSIS — Z6839 Body mass index (BMI) 39.0-39.9, adult: Secondary | ICD-10-CM

## 2020-03-21 ENCOUNTER — Encounter: Payer: Self-pay | Admitting: Family Medicine

## 2020-03-21 ENCOUNTER — Telehealth (INDEPENDENT_AMBULATORY_CARE_PROVIDER_SITE_OTHER): Payer: Self-pay | Admitting: Family Medicine

## 2020-03-21 ENCOUNTER — Other Ambulatory Visit: Payer: Self-pay

## 2020-03-21 VITALS — Wt 214.0 lb

## 2020-03-21 DIAGNOSIS — R110 Nausea: Secondary | ICD-10-CM

## 2020-03-21 DIAGNOSIS — J0140 Acute pansinusitis, unspecified: Secondary | ICD-10-CM

## 2020-03-21 MED ORDER — DOXYCYCLINE HYCLATE 100 MG PO TABS: 100.0000 mg | ORAL_TABLET | Freq: Two times a day (BID) | ORAL | 0 refills | Status: AC

## 2020-03-21 MED ORDER — ONDANSETRON HCL 4 MG PO TABS: 4.0000 mg | ORAL_TABLET | Freq: Three times a day (TID) | ORAL | 0 refills | Status: DC | PRN

## 2020-03-21 NOTE — Progress Notes (Signed)
I connected with Ariana Mcdaniel on 03/21/20 at 11:40 AM EST by video and verified that I am speaking with the correct person using two identifiers.   I discussed the limitations, risks, security and privacy concerns of performing an evaluation and management service by video and the availability of in person appointments. I also discussed with the patient that there may be a patient responsible charge related to this service. The patient expressed understanding and agreed to proceed.  Patient location: Home Provider Location: [facility] Participants: Lesleigh Noe and Barton Fanny   Subjective:     Ariana Mcdaniel is a 45 y.o. female presenting for Cough (Onset of sx was 03/17/20. Tested negative for covid on 03/18/20 with PCR), Sore Throat, Fatigue, and Fever (100.7 oral yesterday am )     Cough Episode onset: 03/17/2020. The cough is non-productive. Associated symptoms include a fever, headaches, myalgias (improved), nasal congestion and a sore throat. Pertinent negatives include no shortness of breath. Treatments tried: zyrtec, robitussin cold and flu. Her past medical history is significant for environmental allergies. There is no history of asthma.  Sore Throat  Associated symptoms include coughing and headaches. Pertinent negatives include no shortness of breath.  Fever  Associated symptoms include coughing, headaches and a sore throat.   Fatigue and body aches on Saturday and bad HA Sunday was the same so got tested Monday/Tuesday some improvement Last night work up with sore throat and head congestion  No loss of taste or smell Husband was sick last week and tested negative for covid  No flu shot, has not been vaccinated for covid  Has not used saline rinse   Review of Systems  Constitutional: Positive for fever.  HENT: Positive for sore throat.   Respiratory: Positive for cough. Negative for shortness of breath.   Musculoskeletal: Positive for myalgias  (improved).  Allergic/Immunologic: Positive for environmental allergies.  Neurological: Positive for headaches.     Social History   Tobacco Use  Smoking Status Former Smoker  . Quit date: 01/04/2010  . Years since quitting: 10.2  Smokeless Tobacco Never Used        Objective:   BP Readings from Last 3 Encounters:  02/28/20 127/71  02/06/20 101/68  01/11/20 101/66   Wt Readings from Last 3 Encounters:  03/21/20 214 lb (97.1 kg)  02/28/20 217 lb (98.4 kg)  02/06/20 213 lb (96.6 kg)   Wt 214 lb (97.1 kg)   LMP 03/11/2020 (Exact Date)   BMI 39.14 kg/m    Physical Exam Constitutional:      Appearance: Normal appearance. She is not ill-appearing.  HENT:     Head: Normocephalic and atraumatic.     Right Ear: External ear normal.     Left Ear: External ear normal.  Eyes:     Conjunctiva/sclera: Conjunctivae normal.  Pulmonary:     Effort: Pulmonary effort is normal. No respiratory distress.  Neurological:     Mental Status: She is alert. Mental status is at baseline.  Psychiatric:        Mood and Affect: Mood normal.        Behavior: Behavior normal.        Thought Content: Thought content normal.        Judgment: Judgment normal.             Assessment & Plan:   Problem List Items Addressed This Visit      Respiratory   Acute non-recurrent pansinusitis -  Primary   Relevant Medications   doxycycline (VIBRA-TABS) 100 MG tablet    Other Visit Diagnoses    Nausea       Relevant Medications   ondansetron (ZOFRAN) 4 MG tablet     Covid negative.  Discussed that it could potentially be covid but with pcr test this is more accurate. She will check with employer to see if retesting is necessary Work note - ok to return once afebrile x 24 hours Suspect possible flu but too late for medication so no need to treat Given sinus symptoms abx sent to start if not improving in the next 2-3 days   Return if symptoms worsen or fail to improve.  Lesleigh Noe, MD

## 2020-03-21 NOTE — Patient Instructions (Signed)
Based on your symptoms, it looks like you have a virus.   Antibiotics are not need for a viral infection but the following will help:   1. Drink plenty of fluids 2. Get lots of rest  Sinus Congestion 1) Neti Pot (Saline rinse) -- 2 times day -- if tolerated 2) Flonase (Store Brand ok) - once daily 3) Over the counter congestion medications  Cough 1) Cough drops can be helpful 2) Nyquil (or nighttime cough medication) 3) Honey is proven to be one of the best cough medications  4) Cough medicine with Dextromethorphan can also be helpful  Sore Throat 1) Honey as above, cough drops 2) Ibuprofen or Aleve can be helpful 3) Salt water Gargles  If you develop fevers (Temperature >100.4), chills, worsening symptoms or symptoms lasting longer than 10 days return to clinic.    

## 2020-03-26 ENCOUNTER — Encounter (INDEPENDENT_AMBULATORY_CARE_PROVIDER_SITE_OTHER): Payer: Self-pay | Admitting: Family Medicine

## 2020-03-26 NOTE — Progress Notes (Signed)
TeleHealth Visit:  Due to the COVID-19 pandemic, this visit was completed with telemedicine (audio/video) technology to reduce patient and provider exposure as well as to preserve personal protective equipment.   [name] has verbally consented to this TeleHealth visit. The patient is located at home, the provider is located at the [facility] office. The participants in this visit include the listed provider and patient. The visit was conducted today via MyChart video.   Chief Complaint: OBESITY Ariana Mcdaniel is here to discuss her progress with her obesity treatment plan along with follow-up of her obesity related diagnoses. Ariana Mcdaniel is on the Category 4 Plan and states she is following her eating plan approximately 80% of the time. Ariana Mcdaniel states she is doing 0 minutes 0 times per week.  Today's visit was #: 17 Starting weight: 239 lbs Starting date: 04/20/2019  Interim History: Ariana Mcdaniel is sick but her COVID test was negative. She has been sticking to the meal plan very well. She is off Saxenda, but her hunger is satisfied. She is getting protein in. She notes that her water intake could be better. She weighs 215 lbs at home today. She would like to stay off [place] for now, as she feels she is doing ok without it.  Subjective:   1. Insulin resistance Ariana Mcdaniel denies polyphagia. Her last fasting insulin was 15.2, and A1c of 5.3 (previously 5.7 in pre-diabetic range).   Lab Results  Component Value Date   INSULIN 15.2 12/12/2019   INSULIN 17.7 04/20/2019   Lab Results  Component Value Date   HGBA1C 5.3 12/12/2019   Assessment/Plan:   1. Insulin resistance Ariana Mcdaniel will  Continue her meal plan, and will continue to work on weight loss, exercise, and decreasing simple carbohydrates to help decrease the risk of diabetes.  2. Class 2 severe obesity with serious comorbidity and body mass index (BMI) of 39.0 to 39.9 in adult, unspecified obesity type (HCC) Ariana Mcdaniel is currently in  the action stage of change. As such, her goal is to continue with weight loss efforts. She has agreed to the Category 4 Plan.   Exercise goals: No exercise has been prescribed at this time.  Behavioral modification strategies: increasing lean protein intake, decreasing simple carbohydrates and increasing water intake.  Ariana Mcdaniel has agreed to follow-up with our clinic in 3 weeks.   Objective:   VITALS: Per patient if applicable, see vitals. GENERAL: Alert and in no acute distress. CARDIOPULMONARY: No increased WOB. Speaking in clear sentences.  PSYCH: Pleasant and cooperative. Speech normal rate and rhythm. Affect is appropriate. Insight and judgement are appropriate. Attention is focused, linear, and appropriate.  NEURO: Oriented as arrived to appointment on time with no prompting.   Lab Results  Component Value Date   CREATININE 0.69 12/12/2019   BUN 13 12/12/2019   NA 140 12/12/2019   K 4.5 12/12/2019   CL 105 12/12/2019   CO2 25 12/12/2019   Lab Results  Component Value Date   ALT 19 12/12/2019   AST 16 12/12/2019   ALKPHOS 65 12/12/2019   BILITOT <0.2 12/12/2019   Lab Results  Component Value Date   HGBA1C 5.3 12/12/2019   HGBA1C 5.7 (H) 04/20/2019   HGBA1C 5.6 10/14/2018   Lab Results  Component Value Date   INSULIN 15.2 12/12/2019   INSULIN 17.7 04/20/2019   Lab Results  Component Value Date   TSH 1.740 10/14/2018   Lab Results  Component Value Date   CHOL 175 12/12/2019   HDL  57 12/12/2019   LDLCALC 104 (H) 12/12/2019   TRIG 76 12/12/2019   CHOLHDL 3.2 10/14/2018   Lab Results  Component Value Date   WBC 9.0 12/07/2018   HGB 12.3 12/07/2018   HCT 38.3 12/07/2018   MCV 80.1 12/07/2018   PLT 293.0 12/07/2018   No results found for: IRON, TIBC, FERRITIN  Attestation Statements:   Reviewed by clinician on day of visit: allergies, medications, problem list, medical history, surgical history, family history, social history, and previous encounter  notes.   Wilhemena Durie, am acting as Location manager for [employer], FNP-C.  I have reviewed the above documentation for accuracy and completeness, and I agree with the above. - Georgianne Fick, FNP

## 2020-04-09 ENCOUNTER — Other Ambulatory Visit: Payer: Self-pay

## 2020-04-09 ENCOUNTER — Ambulatory Visit (INDEPENDENT_AMBULATORY_CARE_PROVIDER_SITE_OTHER): Payer: BC Managed Care – PPO | Admitting: Family Medicine

## 2020-04-09 ENCOUNTER — Encounter (INDEPENDENT_AMBULATORY_CARE_PROVIDER_SITE_OTHER): Payer: Self-pay | Admitting: Family Medicine

## 2020-04-09 VITALS — BP 112/78 | HR 83 | Temp 98.1°F | Ht 62.0 in | Wt 218.0 lb

## 2020-04-09 DIAGNOSIS — E8881 Metabolic syndrome: Secondary | ICD-10-CM

## 2020-04-09 DIAGNOSIS — E559 Vitamin D deficiency, unspecified: Secondary | ICD-10-CM | POA: Diagnosis not present

## 2020-04-09 DIAGNOSIS — E88819 Insulin resistance, unspecified: Secondary | ICD-10-CM

## 2020-04-09 DIAGNOSIS — Z6839 Body mass index (BMI) 39.0-39.9, adult: Secondary | ICD-10-CM | POA: Diagnosis not present

## 2020-04-09 MED ORDER — VITAMIN D (ERGOCALCIFEROL) 1.25 MG (50000 UNIT) PO CAPS: 50000.0000 [IU] | ORAL_CAPSULE | ORAL | 0 refills | Status: DC

## 2020-04-10 ENCOUNTER — Encounter (INDEPENDENT_AMBULATORY_CARE_PROVIDER_SITE_OTHER): Payer: Self-pay | Admitting: Family Medicine

## 2020-04-10 HISTORY — PX: BREAST EXCISIONAL BIOPSY: SUR124

## 2020-04-10 NOTE — Progress Notes (Signed)
Chief Complaint:   OBESITY Ariana Mcdaniel is here to discuss her progress with her obesity treatment plan along with follow-up of her obesity related diagnoses. Ariana Mcdaniel is on the Category 4 Plan and states she is following her eating plan approximately 50% of the time. Ariana Mcdaniel states she is doing 0 minutes 0 times per week.  Today's visit was #: 18 Starting weight: 239 lbs Starting date: 04/20/2019 Today's weight: 218 lbs Today's date: 04/09/2020 Total lbs lost to date: 21 Total lbs lost since last in-office visit: (+1)  Interim History: Ariana Mcdaniel is retaining some water today yper bioimpedance reading (about 3-4 lbs). Her period is late and she feels bloated. She is going to [place] on vacation. But she plans on being cognizant of the food choices. She still feels she is doing well without Saxenda in regard to her appetite. She has been off [place] for a few months at her request.  She has a breast lumpectomy scheduled for February 16th that she is worried about. She has atypical cells and was told there is a 1 in 10 chance it is cancer. She has family hx of breast cancer.  Subjective:   1. Vitamin D deficiency Ariana Mcdaniel's last Vit D level was nearly at goal at 45.6. She is on weekly prescription Vit D.  2. Insulin resistance Ariana Mcdaniel was on Saxenda for polyphagia previously, but she asked to discontinue. She continues to work on diet and exercise to decrease her risk of diabetes.  Lab Results  Component Value Date   INSULIN 15.2 12/12/2019   INSULIN 17.7 04/20/2019   Lab Results  Component Value Date   HGBA1C 5.3 12/12/2019   Assessment/Plan:   1. Vitamin D deficiency We will refill prescription Vitamin D for 1 month. Ariana Mcdaniel will follow-up for routine testing of Vitamin D, at least 2-3 times per year to avoid over-replacement.  - Vitamin D, Ergocalciferol, (DRISDOL) 1.25 MG (50000 UNIT) CAPS capsule; Take 1 capsule (50,000 Units total) by mouth every 7 (seven) days.  Dispense: 4  capsule; Refill: 0  2. Insulin resistance Ariana Mcdaniel will continue her meal plan, and will continue to work on weight loss, exercise, and decreasing simple carbohydrates to help decrease the risk of diabetes. Ariana Mcdaniel agreed to follow-up with [place] as directed to closely monitor her progress.  3. Class 2 severe obesity with serious comorbidity and body mass index (BMI) of 39.0 to 39.9 in adult, unspecified obesity type (HCC) Ariana Mcdaniel is currently in the action stage of change. As such, her goal is to continue with weight loss efforts. She has agreed to the Category 4 Plan.   Exercise goals: All adults should avoid inactivity. Some physical activity is better than none, and adults who participate in any amount of physical activity gain some health benefits.  Behavioral modification strategies: decreasing simple carbohydrates and travel eating strategies.  Ariana Mcdaniel has agreed to follow-up with our clinic in 3 to 4 weeks.   Objective:   Blood pressure 112/78, pulse 83, temperature 98.1 F (36.7 C), height 5\' 2"  (1.575 m), weight 218 lb (98.9 kg), last menstrual period 03/11/2020, SpO2 98 %. Body mass index is 39.87 kg/m.  General: Cooperative, alert, well developed, in no acute distress. HEENT: Conjunctivae and lids unremarkable. Cardiovascular: Regular rhythm.  Lungs: Normal work of breathing. Neurologic: No focal deficits.   Lab Results  Component Value Date   CREATININE 0.69 12/12/2019   BUN 13 12/12/2019   NA 140 12/12/2019   K 4.5 12/12/2019   CL 105  12/12/2019   CO2 25 12/12/2019   Lab Results  Component Value Date   ALT 19 12/12/2019   AST 16 12/12/2019   ALKPHOS 65 12/12/2019   BILITOT <0.2 12/12/2019   Lab Results  Component Value Date   HGBA1C 5.3 12/12/2019   HGBA1C 5.7 (H) 04/20/2019   HGBA1C 5.6 10/14/2018   Lab Results  Component Value Date   INSULIN 15.2 12/12/2019   INSULIN 17.7 04/20/2019   Lab Results  Component Value Date   TSH 1.740 10/14/2018   Lab  Results  Component Value Date   CHOL 175 12/12/2019   HDL 57 12/12/2019   LDLCALC 104 (H) 12/12/2019   TRIG 76 12/12/2019   CHOLHDL 3.2 10/14/2018   Lab Results  Component Value Date   WBC 9.0 12/07/2018   HGB 12.3 12/07/2018   HCT 38.3 12/07/2018   MCV 80.1 12/07/2018   PLT 293.0 12/07/2018   No results found for: IRON, TIBC, FERRITIN  Attestation Statements:   Reviewed by clinician on day of visit: allergies, medications, problem list, medical history, surgical history, family history, social history, and previous encounter notes.   Wilhemena Durie, am acting as Location manager for [employer], FNP-C.  I have reviewed the above documentation for accuracy and completeness, and I agree with the above. -  Georgianne Fick, FNP

## 2020-04-20 ENCOUNTER — Encounter (HOSPITAL_BASED_OUTPATIENT_CLINIC_OR_DEPARTMENT_OTHER): Payer: Self-pay | Admitting: Surgery

## 2020-04-20 ENCOUNTER — Other Ambulatory Visit: Payer: Self-pay

## 2020-04-21 ENCOUNTER — Other Ambulatory Visit (HOSPITAL_COMMUNITY)
Admission: RE | Admit: 2020-04-21 | Discharge: 2020-04-21 | Disposition: A | Discharge status: Home / Self Care | Payer: BC Managed Care – PPO | Source: Ambulatory Visit | Attending: Surgery | Admitting: Surgery

## 2020-04-21 DIAGNOSIS — Z01812 Encounter for preprocedural laboratory examination: Secondary | ICD-10-CM | POA: Diagnosis not present

## 2020-04-21 DIAGNOSIS — Z20822 Contact with and (suspected) exposure to covid-19: Secondary | ICD-10-CM | POA: Diagnosis not present

## 2020-04-22 LAB — SARS CORONAVIRUS 2 (TAT 6-24 HRS): SARS Coronavirus 2: NEGATIVE

## 2020-04-24 ENCOUNTER — Ambulatory Visit
Admission: RE | Admit: 2020-04-24 | Discharge: 2020-04-24 | Disposition: A | Discharge status: Home / Self Care | Payer: BC Managed Care – PPO | Source: Ambulatory Visit | Attending: Surgery | Admitting: Surgery

## 2020-04-24 ENCOUNTER — Other Ambulatory Visit: Payer: Self-pay | Admitting: Surgery

## 2020-04-24 ENCOUNTER — Other Ambulatory Visit: Payer: Self-pay

## 2020-04-24 ENCOUNTER — Encounter (HOSPITAL_BASED_OUTPATIENT_CLINIC_OR_DEPARTMENT_OTHER)
Admission: RE | Admit: 2020-04-24 | Discharge: 2020-04-24 | Disposition: A | Discharge status: Home / Self Care | Payer: BC Managed Care – PPO | Source: Ambulatory Visit | Attending: Surgery | Admitting: Surgery

## 2020-04-24 DIAGNOSIS — N6489 Other specified disorders of breast: Secondary | ICD-10-CM | POA: Diagnosis present

## 2020-04-24 DIAGNOSIS — Z885 Allergy status to narcotic agent status: Secondary | ICD-10-CM | POA: Diagnosis not present

## 2020-04-24 DIAGNOSIS — Z01812 Encounter for preprocedural laboratory examination: Secondary | ICD-10-CM | POA: Insufficient documentation

## 2020-04-24 DIAGNOSIS — Z881 Allergy status to other antibiotic agents status: Secondary | ICD-10-CM | POA: Diagnosis not present

## 2020-04-24 DIAGNOSIS — N6320 Unspecified lump in the left breast, unspecified quadrant: Secondary | ICD-10-CM

## 2020-04-24 DIAGNOSIS — Z87891 Personal history of nicotine dependence: Secondary | ICD-10-CM | POA: Diagnosis not present

## 2020-04-24 DIAGNOSIS — Z803 Family history of malignant neoplasm of breast: Secondary | ICD-10-CM | POA: Diagnosis not present

## 2020-04-24 DIAGNOSIS — D241 Benign neoplasm of right breast: Secondary | ICD-10-CM | POA: Diagnosis not present

## 2020-04-24 DIAGNOSIS — Z880 Allergy status to penicillin: Secondary | ICD-10-CM | POA: Diagnosis not present

## 2020-04-24 LAB — COMPREHENSIVE METABOLIC PANEL
ALT: 27 U/L (ref 0–44)
AST: 18 U/L (ref 15–41)
Albumin: 3.4 g/dL — ABNORMAL LOW (ref 3.5–5.0)
Alkaline Phosphatase: 66 U/L (ref 38–126)
Anion gap: 9 (ref 5–15)
BUN: 14 mg/dL (ref 6–20)
CO2: 25 mmol/L (ref 22–32)
Calcium: 8.7 mg/dL — ABNORMAL LOW (ref 8.9–10.3)
Chloride: 105 mmol/L (ref 98–111)
Creatinine, Ser: 0.7 mg/dL (ref 0.44–1.00)
GFR, Estimated: >60 mL/min (ref >60)
Glucose, Bld: 80 mg/dL (ref 70–99)
Potassium: 3.9 mmol/L (ref 3.5–5.1)
Sodium: 139 mmol/L (ref 135–145)
Total Bilirubin: 0.3 mg/dL (ref 0.3–1.2)
Total Protein: 6.8 g/dL (ref 6.5–8.1)

## 2020-04-24 LAB — CBC WITH DIFFERENTIAL/PLATELET
Abs Immature Granulocytes: 0.01 10*3/uL (ref 0.00–0.07)
Basophils Absolute: 0.1 10*3/uL (ref 0.0–0.1)
Basophils Relative: 1 %
Eosinophils Absolute: 0.1 10*3/uL (ref 0.0–0.5)
Eosinophils Relative: 2 %
HCT: 36.7 % (ref 36.0–46.0)
Hemoglobin: 11.7 g/dL — ABNORMAL LOW (ref 12.0–15.0)
Immature Granulocytes: 0 %
Lymphocytes Relative: 38 %
Lymphs Abs: 2.4 10*3/uL (ref 0.7–4.0)
MCH: 25.4 pg — ABNORMAL LOW (ref 26.0–34.0)
MCHC: 31.9 g/dL (ref 30.0–36.0)
MCV: 79.8 fL — ABNORMAL LOW (ref 80.0–100.0)
Monocytes Absolute: 0.5 10*3/uL (ref 0.1–1.0)
Monocytes Relative: 7 %
Neutro Abs: 3.3 10*3/uL (ref 1.7–7.7)
Neutrophils Relative %: 52 %
Platelets: 347 10*3/uL (ref 150–400)
RBC: 4.6 MIL/uL (ref 3.87–5.11)
RDW: 14.7 % (ref 11.5–15.5)
WBC: 6.3 10*3/uL (ref 4.0–10.5)
nRBC: 0 % (ref 0.0–0.2)

## 2020-04-24 LAB — POCT PREGNANCY, URINE: Preg Test, Ur: NEGATIVE

## 2020-04-24 NOTE — Progress Notes (Signed)
Pt states is coming for labs today.

## 2020-04-24 NOTE — Progress Notes (Signed)

## 2020-04-25 ENCOUNTER — Ambulatory Visit (HOSPITAL_BASED_OUTPATIENT_CLINIC_OR_DEPARTMENT_OTHER): Payer: BC Managed Care – PPO | Admitting: Anesthesiology

## 2020-04-25 ENCOUNTER — Encounter (HOSPITAL_BASED_OUTPATIENT_CLINIC_OR_DEPARTMENT_OTHER): Payer: Self-pay | Admitting: Surgery

## 2020-04-25 ENCOUNTER — Encounter (HOSPITAL_BASED_OUTPATIENT_CLINIC_OR_DEPARTMENT_OTHER)
Admission: RE | Disposition: A | Discharge status: Home / Self Care | Payer: Self-pay | Source: Home / Self Care | Attending: Surgery

## 2020-04-25 ENCOUNTER — Ambulatory Visit
Admission: RE | Admit: 2020-04-25 | Discharge: 2020-04-25 | Disposition: A | Discharge status: Home / Self Care | Payer: BC Managed Care – PPO | Source: Ambulatory Visit | Attending: Surgery | Admitting: Surgery

## 2020-04-25 ENCOUNTER — Ambulatory Visit (HOSPITAL_BASED_OUTPATIENT_CLINIC_OR_DEPARTMENT_OTHER)
Admission: RE | Admit: 2020-04-25 | Discharge: 2020-04-25 | Disposition: A | Discharge status: Home / Self Care | Payer: BC Managed Care – PPO | Attending: Surgery | Admitting: Surgery

## 2020-04-25 DIAGNOSIS — Z881 Allergy status to other antibiotic agents status: Secondary | ICD-10-CM | POA: Insufficient documentation

## 2020-04-25 DIAGNOSIS — N6320 Unspecified lump in the left breast, unspecified quadrant: Secondary | ICD-10-CM

## 2020-04-25 DIAGNOSIS — Z885 Allergy status to narcotic agent status: Secondary | ICD-10-CM | POA: Insufficient documentation

## 2020-04-25 DIAGNOSIS — D241 Benign neoplasm of right breast: Secondary | ICD-10-CM | POA: Insufficient documentation

## 2020-04-25 DIAGNOSIS — N6489 Other specified disorders of breast: Secondary | ICD-10-CM | POA: Insufficient documentation

## 2020-04-25 DIAGNOSIS — Z803 Family history of malignant neoplasm of breast: Secondary | ICD-10-CM | POA: Insufficient documentation

## 2020-04-25 DIAGNOSIS — Z880 Allergy status to penicillin: Secondary | ICD-10-CM | POA: Insufficient documentation

## 2020-04-25 DIAGNOSIS — Z87891 Personal history of nicotine dependence: Secondary | ICD-10-CM | POA: Insufficient documentation

## 2020-04-25 HISTORY — DX: Other specified postprocedural states: Z98.890

## 2020-04-25 HISTORY — PX: BREAST LUMPECTOMY WITH RADIOACTIVE SEED LOCALIZATION: SHX6424

## 2020-04-25 HISTORY — DX: Nausea with vomiting, unspecified: R11.2

## 2020-04-25 SURGERY — BREAST LUMPECTOMY WITH RADIOACTIVE SEED LOCALIZATION
Anesthesia: General | Site: Breast | Laterality: Left

## 2020-04-25 MED ORDER — DEXAMETHASONE SODIUM PHOSPHATE 10 MG/ML IJ SOLN
INTRAMUSCULAR | Status: AC
  Filled 2020-04-25: qty 1

## 2020-04-25 MED ORDER — DEXAMETHASONE SODIUM PHOSPHATE 4 MG/ML IJ SOLN
INTRAMUSCULAR | Status: DC | PRN
  Administered 2020-04-25: 10 mg via INTRAVENOUS

## 2020-04-25 MED ORDER — CHLORHEXIDINE GLUCONATE CLOTH 2 % EX PADS: 6.0000 | MEDICATED_PAD | Freq: Once | CUTANEOUS | Status: DC

## 2020-04-25 MED ORDER — SCOPOLAMINE 1 MG/3DAYS TD PT72
MEDICATED_PATCH | TRANSDERMAL | Status: AC
  Filled 2020-04-25: qty 1

## 2020-04-25 MED ORDER — PROPOFOL 10 MG/ML IV BOLUS
INTRAVENOUS | Status: DC | PRN
  Administered 2020-04-25: 200 mg via INTRAVENOUS

## 2020-04-25 MED ORDER — CIPROFLOXACIN IN D5W 400 MG/200ML IV SOLN
INTRAVENOUS | Status: AC
  Filled 2020-04-25: qty 200

## 2020-04-25 MED ORDER — MIDAZOLAM HCL 2 MG/2ML IJ SOLN
INTRAMUSCULAR | Status: AC
  Filled 2020-04-25: qty 2

## 2020-04-25 MED ORDER — SODIUM CHLORIDE 0.9 % IV SOLN
INTRAVENOUS | Status: DC | PRN
  Administered 2020-04-25: 50 mL

## 2020-04-25 MED ORDER — HYDROCODONE-ACETAMINOPHEN 5-325 MG PO TABS
1.0000 | ORAL_TABLET | Freq: Once | ORAL | Status: AC
Start: 2020-04-25 — End: 2020-04-25
  Administered 2020-04-25: 1 via ORAL

## 2020-04-25 MED ORDER — ONDANSETRON HCL 4 MG/2ML IJ SOLN
INTRAMUSCULAR | Status: AC
  Filled 2020-04-25: qty 2

## 2020-04-25 MED ORDER — FENTANYL CITRATE (PF) 100 MCG/2ML IJ SOLN
INTRAMUSCULAR | Status: AC
  Filled 2020-04-25: qty 2

## 2020-04-25 MED ORDER — ONDANSETRON HCL 4 MG/2ML IJ SOLN: 4.0000 mg | Freq: Four times a day (QID) | INTRAMUSCULAR | Status: DC | PRN

## 2020-04-25 MED ORDER — GABAPENTIN 300 MG PO CAPS
300.0000 mg | ORAL_CAPSULE | ORAL | Status: AC
  Administered 2020-04-25: 300 mg via ORAL

## 2020-04-25 MED ORDER — MIDAZOLAM HCL 5 MG/5ML IJ SOLN
INTRAMUSCULAR | Status: DC | PRN
  Administered 2020-04-25: 2 mg via INTRAVENOUS

## 2020-04-25 MED ORDER — BUPIVACAINE-EPINEPHRINE (PF) 0.25% -1:200000 IJ SOLN
INTRAMUSCULAR | Status: DC | PRN
  Administered 2020-04-25: 20 mL

## 2020-04-25 MED ORDER — LACTATED RINGERS IV SOLN: INTRAVENOUS | Status: DC

## 2020-04-25 MED ORDER — CIPROFLOXACIN IN D5W 400 MG/200ML IV SOLN
400.0000 mg | INTRAVENOUS | Status: AC
  Administered 2020-04-25: 400 mg via INTRAVENOUS

## 2020-04-25 MED ORDER — GABAPENTIN 300 MG PO CAPS
ORAL_CAPSULE | ORAL | Status: AC
  Filled 2020-04-25: qty 1

## 2020-04-25 MED ORDER — FENTANYL CITRATE (PF) 100 MCG/2ML IJ SOLN: 25.0000 ug | INTRAMUSCULAR | Status: DC | PRN

## 2020-04-25 MED ORDER — PROPOFOL 500 MG/50ML IV EMUL
INTRAVENOUS | Status: AC
  Filled 2020-04-25: qty 50

## 2020-04-25 MED ORDER — CELECOXIB 200 MG PO CAPS
200.0000 mg | ORAL_CAPSULE | ORAL | Status: AC
  Administered 2020-04-25: 200 mg via ORAL

## 2020-04-25 MED ORDER — LIDOCAINE 2% (20 MG/ML) 5 ML SYRINGE
INTRAMUSCULAR | Status: DC | PRN
  Administered 2020-04-25: 60 mg via INTRAVENOUS

## 2020-04-25 MED ORDER — IBUPROFEN 800 MG PO TABS: 800.0000 mg | ORAL_TABLET | Freq: Three times a day (TID) | ORAL | 0 refills | Status: DC | PRN

## 2020-04-25 MED ORDER — HYDROCODONE-ACETAMINOPHEN 5-325 MG PO TABS
ORAL_TABLET | ORAL | Status: AC
  Filled 2020-04-25: qty 1

## 2020-04-25 MED ORDER — CELECOXIB 200 MG PO CAPS
ORAL_CAPSULE | ORAL | Status: AC
  Filled 2020-04-25: qty 1

## 2020-04-25 MED ORDER — TRAMADOL HCL 50 MG PO TABS: 50.0000 mg | ORAL_TABLET | Freq: Four times a day (QID) | ORAL | 0 refills | Status: DC | PRN

## 2020-04-25 MED ORDER — 0.9 % SODIUM CHLORIDE (POUR BTL) OPTIME
TOPICAL | Status: DC | PRN
  Administered 2020-04-25: 200 mL

## 2020-04-25 MED ORDER — FENTANYL CITRATE (PF) 100 MCG/2ML IJ SOLN
INTRAMUSCULAR | Status: DC | PRN
  Administered 2020-04-25: 50 ug via INTRAVENOUS

## 2020-04-25 MED ORDER — SCOPOLAMINE 1 MG/3DAYS TD PT72
1.0000 | MEDICATED_PATCH | TRANSDERMAL | Status: DC
  Administered 2020-04-25: 1.5 mg via TRANSDERMAL

## 2020-04-25 MED ORDER — SODIUM CHLORIDE 0.9 % IV SOLN
INTRAVENOUS | Status: AC
  Filled 2020-04-25: qty 10

## 2020-04-25 SURGICAL SUPPLY — 40 items
ADH SKN CLS APL DERMABOND .7 (GAUZE/BANDAGES/DRESSINGS) ×1
APL PRP STRL LF DISP 70% ISPRP (MISCELLANEOUS) ×1
APPLIER CLIP 9.375 MED OPEN (MISCELLANEOUS) ×2
APR CLP MED 9.3 20 MLT OPN (MISCELLANEOUS) ×1
BINDER BREAST XXLRG (GAUZE/BANDAGES/DRESSINGS) ×1 IMPLANT
BLADE SURG 15 STRL LF DISP TIS (BLADE) ×1 IMPLANT
BLADE SURG 15 STRL SS (BLADE) ×2
CHLORAPREP W/TINT 26 (MISCELLANEOUS) ×2 IMPLANT
CLIP APPLIE 9.375 MED OPEN (MISCELLANEOUS) IMPLANT
COVER BACK TABLE 60X90IN (DRAPES) ×2 IMPLANT
COVER MAYO STAND STRL (DRAPES) ×2 IMPLANT
COVER PROBE W GEL 5X96 (DRAPES) ×2 IMPLANT
DERMABOND ADVANCED (GAUZE/BANDAGES/DRESSINGS) ×1
DERMABOND ADVANCED .7 DNX12 (GAUZE/BANDAGES/DRESSINGS) ×1 IMPLANT
DRAPE LAPAROTOMY 100X72 PEDS (DRAPES) ×2 IMPLANT
DRAPE UTILITY XL STRL (DRAPES) ×2 IMPLANT
ELECT COATED BLADE 2.86 ST (ELECTRODE) ×2 IMPLANT
ELECT REM PT RETURN 9FT ADLT (ELECTROSURGICAL) ×2
ELECTRODE REM PT RTRN 9FT ADLT (ELECTROSURGICAL) ×1 IMPLANT
GLOVE ECLIPSE 8.0 STRL XLNG CF (GLOVE) ×2 IMPLANT
GLOVE SRG 8 PF TXTR STRL LF DI (GLOVE) ×1 IMPLANT
GLOVE SURG ENC MOIS LTX SZ6.5 (GLOVE) ×1 IMPLANT
GLOVE SURG UNDER POLY LF SZ8 (GLOVE) ×2
GOWN STRL REUS W/ TWL LRG LVL3 (GOWN DISPOSABLE) ×2 IMPLANT
GOWN STRL REUS W/ TWL XL LVL3 (GOWN DISPOSABLE) ×1 IMPLANT
GOWN STRL REUS W/TWL LRG LVL3 (GOWN DISPOSABLE) ×2
GOWN STRL REUS W/TWL XL LVL3 (GOWN DISPOSABLE) ×2
KIT MARKER MARGIN INK (KITS) ×2 IMPLANT
NDL HYPO 25X1 1.5 SAFETY (NEEDLE) ×1 IMPLANT
NEEDLE HYPO 25X1 1.5 SAFETY (NEEDLE) ×2 IMPLANT
NS IRRIG 1000ML POUR BTL (IV SOLUTION) ×2 IMPLANT
PACK BASIN DAY SURGERY FS (CUSTOM PROCEDURE TRAY) ×2 IMPLANT
PENCIL SMOKE EVACUATOR (MISCELLANEOUS) ×2 IMPLANT
SLEEVE SCD COMPRESS KNEE MED (MISCELLANEOUS) ×2 IMPLANT
SPONGE LAP 4X18 RFD (DISPOSABLE) ×2 IMPLANT
SUT MNCRL AB 4-0 PS2 18 (SUTURE) ×2 IMPLANT
SUT VICRYL 3-0 CR8 SH (SUTURE) ×2 IMPLANT
SYR CONTROL 10ML LL (SYRINGE) ×2 IMPLANT
TOWEL GREEN STERILE FF (TOWEL DISPOSABLE) ×2 IMPLANT
TRAY FAXITRON CT DISP (TRAY / TRAY PROCEDURE) ×2 IMPLANT

## 2020-04-25 NOTE — Anesthesia Procedure Notes (Signed)
Procedure Name: LMA Insertion Date/Time: 04/25/2020 8:33 AM Performed by: Maryella Shivers, CRNA Pre-anesthesia Checklist: Patient identified, Emergency Drugs available, Suction available and Patient being monitored Patient Re-evaluated:Patient Re-evaluated prior to induction Oxygen Delivery Method: Circle system utilized Preoxygenation: Pre-oxygenation with 100% oxygen Induction Type: IV induction Ventilation: Mask ventilation without difficulty LMA: LMA inserted LMA Size: 4.0 Number of attempts: 1 Airway Equipment and Method: Bite block Placement Confirmation: positive ETCO2 Tube secured with: Tape Dental Injury: Teeth and Oropharynx as per pre-operative assessment

## 2020-04-25 NOTE — Anesthesia Preprocedure Evaluation (Signed)
Anesthesia Evaluation  Patient identified by MRN, date of birth, ID band Patient awake    Reviewed: Allergy & Precautions, H&P , NPO status , Patient's Chart, lab work & pertinent test results  History of Anesthesia Complications (+) PONV and history of anesthetic complications  Airway Mallampati: II   Neck ROM: full    Dental   Pulmonary former smoker,    breath sounds clear to auscultation       Cardiovascular negative cardio ROS   Rhythm:regular Rate:Normal     Neuro/Psych PSYCHIATRIC DISORDERS Depression  Neuromuscular disease    GI/Hepatic GERD  ,  Endo/Other    Renal/GU stones     Musculoskeletal   Abdominal   Peds  Hematology   Anesthesia Other Findings   Reproductive/Obstetrics                             Anesthesia Physical Anesthesia Plan  ASA: II  Anesthesia Plan: General   Post-op Pain Management:    Induction: Intravenous  PONV Risk Score and Plan: 4 or greater and Ondansetron, Dexamethasone, Midazolam, Treatment may vary due to age or medical condition and Scopolamine patch - Pre-op  Airway Management Planned: LMA  Additional Equipment:   Intra-op Plan:   Post-operative Plan: Extubation in OR  Informed Consent: I have reviewed the patients History and Physical, chart, labs and discussed the procedure including the risks, benefits and alternatives for the proposed anesthesia with the patient or authorized representative who has indicated his/her understanding and acceptance.     Dental advisory given  Plan Discussed with: CRNA, Anesthesiologist and Surgeon  Anesthesia Plan Comments:         Anesthesia Quick Evaluation

## 2020-04-25 NOTE — Anesthesia Postprocedure Evaluation (Signed)
Anesthesia Post Note  Patient: Ariana Mcdaniel  Procedure(s) Performed: LEFT BREAST LUMPECTOMY WITH RADIOACTIVE SEED LOCALIZATION (Left Breast)     Patient location during evaluation: PACU Anesthesia Type: General Level of consciousness: awake and alert Pain management: pain level controlled Vital Signs Assessment: post-procedure vital signs reviewed and stable Respiratory status: spontaneous breathing, nonlabored ventilation, respiratory function stable and patient connected to nasal cannula oxygen Cardiovascular status: blood pressure returned to baseline and stable Postop Assessment: no apparent nausea or vomiting Anesthetic complications: no   No complications documented.  Last Vitals:  Vitals:   04/25/20 0945 04/25/20 0955  BP: 124/70 124/78  Pulse: 67 74  Resp: 16 16  Temp:  36.8 C  SpO2: 97% 97%    Last Pain:  Vitals:   04/25/20 0955  TempSrc:   PainSc: [place]

## 2020-04-25 NOTE — Interval H&P Note (Signed)
History and Physical Interval Note:  04/25/2020 8:25 AM  Ariana Mcdaniel  has presented today for surgery, with the diagnosis of LEFT BREAST MASS.  The various methods of treatment have been discussed with the patient and family. After consideration of risks, benefits and other options for treatment, the patient has consented to  Procedure(s): LEFT BREAST LUMPECTOMY WITH RADIOACTIVE SEED LOCALIZATION (Left) as a surgical intervention.  The patient's history has been reviewed, patient examined, no change in status, stable for surgery.  I have reviewed the patient's chart and labs.  Questions were answered to the patient's satisfaction.     [place]

## 2020-04-25 NOTE — H&P (Signed)
Ariana Mcdaniel Appointment: 02/20/2020 1:50 PM Location: [facility] Patient #: [id number] DOB: 1975/11/07 Married / Language: [name] / Race: White Female  History of Present Illness Ariana Mcdaniel A. Maclin Guerrette MD; 02/20/2020 3:23 PM) Patient words: Patient sent at the request of Dr. Isaiah Mcdaniel due to abnormal mammography. She had married in each breast in the upper quadrant which showed atypical density/mass. Core biopsy right showed fibroadenoma and core biopsy of left showed flat atypia. She has no complaints of breast mass, breast pain, nipple discharge or any change either breast. She had a great maternal aunt with breast cancer but no other family history of breast cancer noted.    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  EXAM: DIGITAL DIAGNOSTIC BILATERAL MAMMOGRAM WITH CAD AND TOMO  ULTRASOUND RIGHT BREAST  COMPARISON: Previous exam(s).  ACR Breast Density Category b: There are scattered areas of fibroglandular density.  FINDINGS: Mammogram:  Right breast: There is a stable mass in the upper outer right breast best seen on the MLO view measuring 0.7 cm. The originally seen asymmetry in the medial aspect of the right breast does not persist again today. No new suspicious mass, distortion, or microcalcifications are identified to suggest presence of malignancy.  Left breast: Spot 2D magnification views were performed in addition to standard views for calcifications in the outer left breast. On the spot magnification views there is a group of punctate calcifications in the lower outer left breast spanning 0.8 cm.  No associated suspicious mass or asymmetry.  Mammographic images were processed with CAD.  Ultrasound:  Targeted ultrasound is performed in the right breast at 9 o'clock 4 cm from the nipple demonstrating a cluster of anechoic masses measuring 1.1 x 0.5 x 0.9 cm, previously measuring 1.1 x 0.5 x 0.7 cm. At 10 o'clock 6 cm from the nipple there is an oval circumscribed hypoechoic mass measuring 0.7 x 0.5 x 1.0 cm, previously measuring 0.7 x 0.4 x 1.0 cm.  IMPRESSION: 1. Indeterminate calcifications in the lower outer LEFT breast spanning 0.8 cm.  2. Stable benign cluster of cysts in the RIGHT breast at 9 o'clock.  3. Stable probably benign mass in the RIGHT breast at 10 o'clock possibly representing a complicated cyst or solid mass.  RECOMMENDATION: 1. Stereotactic core needle biopsy of the left breast calcifications.  2. If the biopsy returns as a benign pathology, recommend diagnostic bilateral mammogram and right breast ultrasound in 1 year to confirm 2 year stability of the probably benign right breast mass. If the biopsy returns as atypia or malignancy recommend ultrasound-guided core needle biopsy of the probably benign right breast mass at 10 o'clock.  The patient will be scheduled for biopsy prior to leaving our office today.  BI-RADS CATEGORY 4: Suspicious.   Electronically Signed By: Audie Pinto M.D. On: 12/23/2019 08:40.  The patient is a 45 year old female.   Past Surgical History Darden Palmer, [place]; 02/20/2020 2:10 PM) Oral Surgery Tonsillectomy  Diagnostic Studies History Darden Palmer, [place]; 02/20/2020 2:10 PM) Colonoscopy never Mammogram within last year Pap Smear 1-5 years ago  Allergies Darden Palmer, RMA; 02/20/2020 2:14 PM) Amoxicillin *PENICILLINS* Clindamycin HCl *CHEMICALS* oxyCODONE HCl *ANALGESICS - OPIOID* Allergies Reconciled  Medication History Darden Palmer, [place]; 02/20/2020 2:15  PM) Omeprazole (20MG  Tablet DR, Oral) Active. Vitamin D3 (1.25 MG(50000 UT) Capsule, Oral) Active. Medications Reconciled  Social History Darden Palmer, [place]; 02/20/2020 2:10 PM) Alcohol use Occasional alcohol use. Caffeine use Carbonated beverages. No drug use Tobacco use Former smoker.  Family History Darden Palmer, [place]; 02/20/2020 2:10 PM) Breast Cancer Family Members In General. Depression Family Members In General. Hypertension Family Members In General. Thyroid problems Family Members In General, Mother.  Pregnancy / Birth History Darden Palmer, [place]; 02/20/2020 2:10 PM) Age at menarche 60 years. Gravida 2 Length (months) of breastfeeding 3-6 Maternal age 18-25 Para 2 Regular periods  Other Problems Darden Palmer, [place]; 02/20/2020 2:10 PM) Gastroesophageal Reflux Disease Kidney Stone Lump In Breast Pulmonary Embolism / Blood Clot in Legs     Review of Systems Darden Palmer RMA; 02/20/2020 2:10 PM) General Not Present- Appetite Loss, Chills, Fatigue, Fever, Night Sweats, Weight Gain and Weight Loss. Skin Not Present- Change in Wart/Mole, Dryness, Hives, Jaundice, New Lesions, Non-Healing Wounds, Rash and Ulcer. HEENT Present- Seasonal Allergies. Not Present- Earache, Hearing Loss, Hoarseness, Nose Bleed, Oral Ulcers, Ringing in the Ears, Sinus Pain, Sore Throat, Visual Disturbances, Wears glasses/contact lenses and Yellow Eyes. Respiratory Not Present- Bloody sputum, Chronic Cough, Difficulty Breathing, Snoring and Wheezing. Breast Present- Breast Mass. Not Present- Breast Pain, Nipple Discharge and Skin Changes. Cardiovascular Not Present- Chest Pain, Difficulty Breathing Lying Down, Leg Cramps, Palpitations, Rapid Heart Rate, Shortness of Breath and Swelling of Extremities. Gastrointestinal Not Present- Abdominal Pain, Bloating, Bloody Stool, Change in Bowel Habits, Chronic diarrhea, Constipation, Difficulty Swallowing, Excessive gas,  Gets full quickly at meals, Hemorrhoids, Indigestion, Nausea, Rectal Pain and Vomiting. Female Genitourinary Not Present- Frequency, Nocturia, Painful Urination, Pelvic Pain and Urgency. Musculoskeletal Not Present- Back Pain, Joint Pain, Joint Stiffness, Muscle Pain, Muscle Weakness and Swelling of Extremities. Neurological Not Present- Decreased Memory, Fainting, Headaches, Numbness, Seizures, Tingling, Tremor, Trouble walking and Weakness. Psychiatric Not Present- Anxiety, Bipolar, Change in Sleep Pattern, Depression, Fearful and Frequent crying. Endocrine Not Present- Cold Intolerance, Excessive Hunger, Hair Changes, Heat Intolerance, Hot flashes and New Diabetes. Hematology Not Present- Blood Thinners, Easy Bruising, Excessive bleeding, Gland problems, HIV and Persistent Infections.  Vitals Lattie Haw Buchanan RMA; 02/20/2020 2:16 PM) 02/20/2020 2:15 PM Weight: 218.38 lb Height: 62in Body Surface Area: 1.98 m Body Mass Index: 39.94 kg/m  Temp.: 98.26F  Pulse: 93 (Regular)  P.OX: 97% (Room air) BP: 106/72(Sitting, Left Arm, Standard)        Physical Exam (Fawnda Vitullo A. Marco Adelson MD; 02/20/2020 3:23 PM)  General Mental Status-Alert. General Appearance-Consistent with stated age. Hydration-Well hydrated. Voice-Normal.  Head and Neck Head-normocephalic, atraumatic with no lesions or palpable masses. Trachea-midline. Thyroid Gland Characteristics - normal size and consistency.  Eye Eyeball - Bilateral-Extraocular movements intact. Sclera/Conjunctiva - Bilateral-No scleral icterus.  Chest and Lung Exam Chest and lung exam reveals -quiet, even and easy respiratory effort with no use of accessory muscles and on auscultation, normal breath sounds, no adventitious sounds and normal vocal resonance. Inspection Chest Wall - Normal. Back - normal.  Breast Breast - Left-Symmetric, Non Tender, No Biopsy scars, no Dimpling - Left, No Inflammation,  No Lumpectomy scars, No Mastectomy scars, No [place]au [place]' [place]. Breast - Right-Symmetric, Non Tender, No Biopsy scars, no Dimpling - Right, No Inflammation, No Lumpectomy scars, No Mastectomy scars, No Peau d' Orange. Breast Lump-No Palpable Breast Mass.  Cardiovascular Cardiovascular examination reveals -normal heart sounds, regular rate and rhythm with no murmurs and normal pedal pulses  bilaterally.  Abdomen Inspection Inspection of the abdomen reveals - No Hernias. Skin - Scar - no surgical scars. Palpation/Percussion Palpation and Percussion of the abdomen reveal - Soft, Non Tender, No Rebound tenderness, No Rigidity (guarding) and No hepatosplenomegaly. Auscultation Auscultation of the abdomen reveals - Bowel sounds normal.  Neurologic Neurologic evaluation reveals -alert and oriented x 3 with no impairment of recent or remote memory. Mental Status-Normal.  Musculoskeletal Normal Exam - Left-Upper Extremity Strength Normal and Lower Extremity Strength Normal. Normal Exam - Right-Upper Extremity Strength Normal and Lower Extremity Strength Normal.  Lymphatic Head & Neck  General Head & Neck Lymphatics: Bilateral - Description - Normal. Axillary  General Axillary Region: Bilateral - Description - Normal. Tenderness - Non Tender. Femoral & Inguinal  Generalized Femoral & Inguinal Lymphatics: Bilateral - Description - Normal. Tenderness - Non Tender.    Assessment & Plan (Lashunta Frieden A. Toneisha Savary MD; 02/20/2020 3:23 PM)  MASS OF LEFT BREAST ON MAMMOGRAM (N63.20) Impression: Flat atypia noted on core biopsy. Discussed the pros and cons of lumpectomy but recommend left breast seed localized due to atypia. Risks and benefits discussed. Long-term expectations discussed. Risk of lumpectomy include bleeding, infection, seroma, more surgery, use of seed/wire, wound care, cosmetic deformity and the need for other treatments, death , blood clots, death. Pt agrees to  proceed.  Current Plans Pt Education - [facility] Breast Biopsy HCI: discussed with patient and provided information. The anatomy and the physiology was discussed. The pathophysiology and natural history of the disease was discussed. Options were discussed and recommendations were made. Technique, risks, benefits, & alternatives were discussed. Risks such as stroke, heart attack, bleeding, indection, death, and other risks discussed. Questions answered. The patient agrees to proceed.  BREAST FIBROADENOMA, RIGHT (D24.1) Impression: No further intervention at this time required. Pathophysiology of fibroadenoma discussed with the patient as well as the pros and cons of surgery versus observation. Given the benign nature, she chooses to observe it for now.  Current Plans Pt Education - [facility] Free Text Education/Instructions: discussed with patient and provided information.

## 2020-04-25 NOTE — Discharge Instructions (Signed)
[place] Office Phone Number [phone]  BREAST BIOPSY/ PARTIAL MASTECTOMY: POST OP INSTRUCTIONS  Always review your discharge instruction sheet given to you by the facility where your surgery was performed.  IF YOU HAVE DISABILITY OR FAMILY LEAVE FORMS, YOU MUST BRING THEM TO THE OFFICE FOR PROCESSING.  DO NOT GIVE THEM TO YOUR DOCTOR.  1. A prescription for pain medication may be given to you upon discharge.  Take your pain medication as prescribed, if needed.  If narcotic pain medicine is not needed, then you may take acetaminophen (Tylenol) or ibuprofen (Advil) as needed. 2. Take your usually prescribed medications unless otherwise directed 3. If you need a refill on your pain medication, please contact your pharmacy.  They will contact our office to request authorization.  Prescriptions will not be filled after 5pm or on week-ends. 4. You should eat very light the first 24 hours after surgery, such as soup, crackers, pudding, etc.  Resume your normal diet the day after surgery. 5. Most patients will experience some swelling and bruising in the breast.  Ice packs and a good support bra will help.  Swelling and bruising can take several days to resolve.  6. It is common to experience some constipation if taking pain medication after surgery.  Increasing fluid intake and taking a stool softener will usually help or prevent this problem from occurring.  A mild laxative (Milk of Magnesia or Miralax) should be taken according to package directions if there are no bowel movements after 48 hours. 7. Unless discharge instructions indicate otherwise, you may remove your bandages 24-48 hours after surgery, and you may shower at that time.  You may have steri-strips (small skin tapes) in place directly over the incision.  These strips should be left on the skin for 7-10 days.  If your surgeon used skin glue on the incision, you may shower in 24 hours.  The glue will flake off over the  next 2-3 weeks.  Any sutures or staples will be removed at the office during your follow-up visit. 8. ACTIVITIES:  You may resume regular daily activities (gradually increasing) beginning the next day.  Wearing a good support bra or sports bra minimizes pain and swelling.  You may have sexual intercourse when it is comfortable. a. You may drive when you no longer are taking prescription pain medication, you can comfortably wear a seatbelt, and you can safely maneuver your car and apply brakes. b. RETURN TO WORK:  ______________________________________________________________________________________ 9. You should see your doctor in the office for a follow-up appointment approximately two weeks after your surgery.  Your doctor's nurse will typically make your follow-up appointment when she calls you with your pathology report.  Expect your pathology report 2-3 business days after your surgery.  You may call to check if you do not hear from [place] after three days. 10. OTHER INSTRUCTIONS: _______________________________________________________________________________________________ _____________________________________________________________________________________________________________________________________ _____________________________________________________________________________________________________________________________________ _____________________________________________________________________________________________________________________________________  WHEN TO CALL YOUR DOCTOR: 1. Fever over 101.0 2. Nausea and/or vomiting. 3. Extreme swelling or bruising. 4. Continued bleeding from incision. 5. Increased pain, redness, or drainage from the incision.  The clinic staff is available to answer your questions during regular business hours.  Please don't hesitate to call and ask to speak to one of the nurses for clinical concerns.  If you have a medical emergency, go to the nearest  emergency room or call 911.  A surgeon from [facility] is always on call at the hospital.  For further questions, please visit [email or web address]   *  You had Norco/Vicodin 5-325 at 9:55 am   Post Anesthesia Home Care Instructions  Activity: Get plenty of rest for the remainder of the day. A responsible individual must stay with you for 24 hours following the procedure.  For the next 24 hours, DO NOT: -Drive a car -Paediatric nurse -Drink alcoholic beverages -Take any medication unless instructed by your physician -Make any legal decisions or sign important papers.  Meals: Start with liquid foods such as gelatin or soup. Progress to regular foods as tolerated. Avoid greasy, spicy, heavy foods. If nausea and/or vomiting occur, drink only clear liquids until the nausea and/or vomiting subsides. Call your physician if vomiting continues.  Special Instructions/Symptoms: Your throat may feel dry or sore from the anesthesia or the breathing tube placed in your throat during surgery. If this causes discomfort, gargle with warm salt water. The discomfort should disappear within 24 hours.  If you had a scopolamine patch placed behind your ear for the management of post- operative nausea and/or vomiting:  1. The medication in the patch is effective for 72 hours, after which it should be removed.  Wrap patch in a tissue and discard in the trash. Wash hands thoroughly with soap and water. 2. You may remove the patch earlier than 72 hours if you experience unpleasant side effects which may include dry mouth, dizziness or visual disturbances. 3. Avoid touching the patch. Wash your hands with soap and water after contact with the patch.

## 2020-04-25 NOTE — Op Note (Signed)
Preoperative diagnosis: Left breast atypia flat  Postoperative diagnosis: Same  Procedure: Left breast seed localized lumpectomy  Surgeon: Erroll Luna, MD  Anesthesia: LMA with local  EBL: Minimal  Specimen: Left breast tissue with seed and clip verified by Faxitron  Drains: None  IV fluids: Per anesthesia record  Indications for procedure: The patient is a 45 year old female found to have bilateral mammographic abnormalities.  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  Given the atypical cells she was referred for evaluation for left breast lumpectomy.  We discussed the findings and significance of potential upgrade risk to malignancy being as high as 20%.  She opted for left breast lumpectomy for that reason.  Nonsurgical options were discussed as well.The procedure has been discussed with the patient. Alternatives to surgery have been discussed with the patient.  Risks of surgery include bleeding,  Infection,  Seroma formation, death,  and the need for further surgery.   The patient understands and wishes to proceed.   Description of procedure: The patient was met in the holding area and questions were answered.  Neoprobe used to localize seed left breast lower outer quadrant.  This was placed per radiology as an outpatient.  She was then taken back to the operating.  She is placed supine upon the OR table.  After induction general esthesia left breast was prepped and draped in sterile fashion and timeout performed.  Proper patient, site and procedure verified and films were available for review.  Neoprobe used and seed localized left lower outer breast.  Curvilinear incision was made over the signal.  Dissection was carried out and all tissue around the seed and clip were excised with a grossly negative margin.  The Faxitron image revealed both seed and clip to be in the specimen.  The cavity was irrigated  with antibiotic solution and then irrigated again with plain saline until clean.  There is no bleeding.  Local anesthetic was infiltrated.  The wound was closed with 3-0 Vicryl and 4-0 Monocryl.  Dermabond was applied.  All counts were found to be correct.  Patient was awoke extubated taken to recovery in satisfactory condition.

## 2020-04-25 NOTE — Transfer of Care (Signed)
Immediate Anesthesia Transfer of Care Note  Patient: Ariana Mcdaniel  Procedure(s) Performed: LEFT BREAST LUMPECTOMY WITH RADIOACTIVE SEED LOCALIZATION (Left Breast)  Patient Location: PACU  Anesthesia Type:General  Level of Consciousness: sedated  Airway & Oxygen Therapy: Patient Spontanous Breathing and Patient connected to face mask oxygen  Post-op Assessment: Report given to RN and Post -op Vital signs reviewed and stable  Post vital signs: Reviewed and stable  Last Vitals:  Vitals Value Taken Time  BP 119/69 04/25/20 0930  Temp    Pulse 89 04/25/20 0934  Resp 16 04/25/20 0934  SpO2 94 % 04/25/20 0934  Vitals shown include unvalidated device data.  Last Pain:  Vitals:   04/25/20 0919  TempSrc:   PainSc: Asleep         Complications: No complications documented.

## 2020-04-27 ENCOUNTER — Encounter (HOSPITAL_BASED_OUTPATIENT_CLINIC_OR_DEPARTMENT_OTHER): Payer: Self-pay | Admitting: Surgery

## 2020-04-30 ENCOUNTER — Other Ambulatory Visit: Payer: Self-pay

## 2020-04-30 ENCOUNTER — Ambulatory Visit (INDEPENDENT_AMBULATORY_CARE_PROVIDER_SITE_OTHER): Payer: BC Managed Care – PPO | Admitting: Family Medicine

## 2020-04-30 ENCOUNTER — Other Ambulatory Visit (HOSPITAL_COMMUNITY): Payer: BC Managed Care – PPO

## 2020-04-30 ENCOUNTER — Encounter (INDEPENDENT_AMBULATORY_CARE_PROVIDER_SITE_OTHER): Payer: Self-pay | Admitting: Family Medicine

## 2020-04-30 VITALS — BP 133/85 | HR 79 | Temp 98.8°F | Ht 62.0 in | Wt 221.0 lb

## 2020-04-30 DIAGNOSIS — Z6841 Body Mass Index (BMI) 40.0 and over, adult: Secondary | ICD-10-CM

## 2020-04-30 DIAGNOSIS — E8881 Metabolic syndrome: Secondary | ICD-10-CM | POA: Diagnosis not present

## 2020-04-30 DIAGNOSIS — E559 Vitamin D deficiency, unspecified: Secondary | ICD-10-CM | POA: Diagnosis not present

## 2020-04-30 DIAGNOSIS — E88819 Insulin resistance, unspecified: Secondary | ICD-10-CM

## 2020-04-30 LAB — SURGICAL PATHOLOGY

## 2020-04-30 MED ORDER — OZEMPIC (0.25 OR 0.5 MG/DOSE) 2 MG/1.5ML ~~LOC~~ SOPN: 0.2500 mg | PEN_INJECTOR | SUBCUTANEOUS | 0 refills | Status: DC

## 2020-04-30 MED ORDER — VITAMIN D (ERGOCALCIFEROL) 1.25 MG (50000 UNIT) PO CAPS: 50000.0000 [IU] | ORAL_CAPSULE | ORAL | 0 refills | Status: DC

## 2020-05-01 ENCOUNTER — Encounter (INDEPENDENT_AMBULATORY_CARE_PROVIDER_SITE_OTHER): Payer: Self-pay | Admitting: Family Medicine

## 2020-05-01 NOTE — Progress Notes (Signed)
Chief Complaint:   OBESITY Ariana Mcdaniel is here to discuss her progress with her obesity treatment plan along with follow-up of her obesity related diagnoses. Ariana Mcdaniel is on the Category 4 Plan and states she is following her eating plan approximately 0% of the time. Ariana Mcdaniel states she is not exercising at this time.  Today's visit was #: 31 Starting weight: 239 lbs Starting date: 04/20/2019 Today's weight: 221 lbs Today's date: 04/30/2020 Total lbs lost to date: 18 lbs Total lbs lost since last in-office visit: (+3)  Interim History: Ariana Mcdaniel feels that her hunger is fairly well-controlled.  She did not like the daily  injections with Saxenda so we discontinued it.  She has been off plan recently due to stress surrounding having a breast biopsy.  She is still waiting on results.  She started back on Category 4 today.  Subjective:   1. Vitamin D deficiency Vitamin D nearly at goal (67, goal is 13).  On weekly prescription vitamin D.  2. Insulin resistance Notes some polyphagia.  She said a friend told her about Ozempic and she would like to try it.  Lab Results  Component Value Date   INSULIN 15.2 12/12/2019   INSULIN 17.7 04/20/2019   Lab Results  Component Value Date   HGBA1C 5.3 12/12/2019   Assessment/Plan:   1. Vitamin D deficiency Will refill vitamin D 50,000 IU weekly.  Prescription has been sent to pharmacy.  Will check vitamin D level at next office visit.  - Refill Vitamin D, Ergocalciferol, (DRISDOL) 1.25 MG (50000 UNIT) CAPS capsule; Take 1 capsule (50,000 Units total) by mouth every 7 (seven) days.  Dispense: 4 capsule; Refill: 0  2. Insulin resistance Ariana Mcdaniel will start Ozempic 0.25 mg subcutaneously weekly.  If insurance does not cover, will try to get PA for [facility].  A prescription for Ozempic has been sent to her pharmacy.  - Start Semaglutide,0.25 or 0.5MG /DOS, (OZEMPIC, 0.25 OR 0.5 MG/DOSE,) 2 MG/1.5ML SOPN; Inject 0.25 mg into the skin once a week.  Dispense:  1.5 mL; Refill: 0  3. Class 3 severe obesity with serious comorbidity and body mass index (BMI) of 40.0 to 44.9 in adult, unspecified obesity type (HCC)  Ariana Mcdaniel is currently in the action stage of change. As such, her goal is to continue with weight loss efforts. She has agreed to the Category 4 Plan.   Exercise goals: All adults should avoid inactivity. Some physical activity is better than none, and adults who participate in any amount of physical activity gain some health benefits.  Behavioral modification strategies: increasing lean protein intake and decreasing simple carbohydrates.  Ariana Mcdaniel has agreed to follow-up with our clinic in 3-4 weeks, fasting.   Objective:   Blood pressure 133/85, pulse 79, temperature 98.8 F (37.1 C), height 5\' 2"  (1.575 m), weight 221 lb (100.2 kg), last menstrual period 04/12/2020, SpO2 98 %. Body mass index is 40.42 kg/m.  General: Cooperative, alert, well developed, in no acute distress. HEENT: Conjunctivae and lids unremarkable. Cardiovascular: Regular rhythm.  Lungs: Normal work of breathing. Neurologic: No focal deficits.   Lab Results  Component Value Date   CREATININE 0.70 04/24/2020   BUN 14 04/24/2020   NA 139 04/24/2020   K 3.9 04/24/2020   CL 105 04/24/2020   CO2 25 04/24/2020   Lab Results  Component Value Date   ALT 27 04/24/2020   AST 18 04/24/2020   ALKPHOS 66 04/24/2020   BILITOT 0.3 04/24/2020   Lab Results  Component  Value Date   HGBA1C 5.3 12/12/2019   HGBA1C 5.7 (H) 04/20/2019   HGBA1C 5.6 10/14/2018   Lab Results  Component Value Date   INSULIN 15.2 12/12/2019   INSULIN 17.7 04/20/2019   Lab Results  Component Value Date   TSH 1.740 10/14/2018   Lab Results  Component Value Date   CHOL 175 12/12/2019   HDL 57 12/12/2019   LDLCALC 104 (H) 12/12/2019   TRIG 76 12/12/2019   CHOLHDL 3.2 10/14/2018   Lab Results  Component Value Date   WBC 6.3 04/24/2020   HGB 11.7 (L) 04/24/2020   HCT 36.7  04/24/2020   MCV 79.8 (L) 04/24/2020   PLT 347 04/24/2020   Attestation Statements:   Reviewed by clinician on day of visit: allergies, medications, problem list, medical history, surgical history, family history, social history, and previous encounter notes.  I, Water quality scientist, CMA, am acting as Location manager for [employer], [place].  I have reviewed the above documentation for accuracy and completeness, and I agree with the above. -  Georgianne Fick, FNP

## 2020-05-22 ENCOUNTER — Encounter (INDEPENDENT_AMBULATORY_CARE_PROVIDER_SITE_OTHER): Payer: Self-pay | Admitting: Family Medicine

## 2020-05-22 ENCOUNTER — Other Ambulatory Visit: Payer: Self-pay

## 2020-05-22 ENCOUNTER — Ambulatory Visit (INDEPENDENT_AMBULATORY_CARE_PROVIDER_SITE_OTHER): Payer: BC Managed Care – PPO | Admitting: Family Medicine

## 2020-05-22 VITALS — BP 115/78 | HR 77 | Temp 97.9°F | Ht 62.0 in | Wt 220.0 lb

## 2020-05-22 DIAGNOSIS — E88819 Insulin resistance, unspecified: Secondary | ICD-10-CM

## 2020-05-22 DIAGNOSIS — Z6841 Body Mass Index (BMI) 40.0 and over, adult: Secondary | ICD-10-CM | POA: Diagnosis not present

## 2020-05-22 DIAGNOSIS — E559 Vitamin D deficiency, unspecified: Secondary | ICD-10-CM | POA: Diagnosis not present

## 2020-05-22 DIAGNOSIS — E8881 Metabolic syndrome: Secondary | ICD-10-CM

## 2020-05-22 MED ORDER — OZEMPIC (0.25 OR 0.5 MG/DOSE) 2 MG/1.5ML ~~LOC~~ SOPN: 0.5000 mg | PEN_INJECTOR | SUBCUTANEOUS | 0 refills | Status: DC

## 2020-05-23 LAB — INSULIN, RANDOM: INSULIN: 12 u[IU]/mL (ref 2.6–24.9)

## 2020-05-23 LAB — HEMOGLOBIN A1C
Est. average glucose Bld gHb Est-mCnc: 114 mg/dL
Hgb A1c MFr Bld: 5.6 % (ref 4.8–5.6)

## 2020-05-23 LAB — VITAMIN D 25 HYDROXY (VIT D DEFICIENCY, FRACTURES): Vit D, 25-Hydroxy: 26.8 ng/mL — ABNORMAL LOW (ref 30.0–100.0)

## 2020-05-23 NOTE — Progress Notes (Signed)
Chief Complaint:   OBESITY Ariana Mcdaniel is here to discuss her progress with her obesity treatment plan along with follow-up of her obesity related diagnoses. Ariana Mcdaniel is on the Category 4 Plan and states she is following her eating plan approximately 80% of the time. Ariana Mcdaniel states she is doing 0 minutes 0 times per week.  Today's visit was #: 20 Starting weight: 239 lbs Starting date: 04/20/2019 Today's weight: 220 lbs Today's date: 05/22/2020 Total lbs lost to date: 19 lbs Total lbs lost since last in-office visit: 1 lb  Interim History: Ariana Mcdaniel struggles more on weekends with sticking to the plan. 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 Food availability is "iffy" at the competitions.  Subjective:   1. Vitamin D deficiency Ariana Mcdaniel's Vitamin D level was slightly below goal of 50 at 45.6 on 12/12/2019. She is currently taking prescription vitamin D 50,000 IU each week.  Ref. Range 12/12/2019 08:14  Vitamin D, 25-Hydroxy Latest Ref Range: 30.0 - 100.0 ng/mL 45.6   2. Insulin resistance Ariana Mcdaniel's last fasting insulin level was elevated at 15.2. Ozempic was started for polyphagia at last OV. She feels it helps with appetite. She reports mild constipation and denies nausea.  Assessment/Plan:   1. Vitamin D deficiency  She agrees to continue to take prescription Vitamin D @50 ,000 IU every week and will follow-up for routine testing of Vitamin D, at least 2-3 times per year to avoid over-replacement. Check labs today.  - VITAMIN D 25 Hydroxy (Vit-D Deficiency, Fractures)  2. Insulin resistance  Check labs today. Increase Ozempic, as per below.  - Hemoglobin A1c - Insulin, random  - Semaglutide,0.25 or 0.5MG /DOS, (OZEMPIC, 0.25 OR 0.5 MG/DOSE,) 2 MG/1.5ML SOPN; Inject 0.5 mg into the skin once a week.  Dispense: 1.5 mL; Refill: 0  3. Class 3 severe obesity with serious comorbidity and body mass index (BMI) of 40.0 to 44.9 in adult, unspecified obesity type  (HCC) Ariana Mcdaniel is currently in the action stage of change. As such, her goal is to continue with weight loss efforts. She has agreed to the Category 4 Plan.   Exercise goals: Try to walk at lunch  Behavioral modification strategies: increasing lean protein intake and travel eating strategies.  Ariana Mcdaniel has agreed to follow-up with our clinic in 3-4 weeks.   Ariana Mcdaniel was informed we would discuss her lab results at her next visit unless there is a critical issue that needs to be addressed sooner. Ariana Mcdaniel agreed to keep her next visit at the agreed upon time to discuss these results.  Objective:   Blood pressure 115/78, pulse 77, temperature 97.9 F (36.6 C), height 5\' 2"  (1.575 m), weight 220 lb (99.8 kg), SpO2 99 %. Body mass index is 40.24 kg/m.  General: Cooperative, alert, well developed, in no acute distress. HEENT: Conjunctivae and lids unremarkable. Cardiovascular: Regular rhythm.  Lungs: Normal work of breathing. Neurologic: No focal deficits.   Lab Results  Component Value Date   CREATININE 0.70 04/24/2020   BUN 14 04/24/2020   NA 139 04/24/2020   K 3.9 04/24/2020   CL 105 04/24/2020   CO2 25 04/24/2020   Lab Results  Component Value Date   ALT 27 04/24/2020   AST 18 04/24/2020   ALKPHOS 66 04/24/2020   BILITOT 0.3 04/24/2020   Lab Results  Component Value Date   HGBA1C 5.6 05/22/2020   HGBA1C 5.3 12/12/2019   HGBA1C 5.7 (H) 04/20/2019   HGBA1C 5.6 10/14/2018   Lab Results  Component Value Date   INSULIN 12.0 05/22/2020   INSULIN 15.2 12/12/2019   INSULIN 17.7 04/20/2019   Lab Results  Component Value Date   TSH 1.740 10/14/2018   Lab Results  Component Value Date   CHOL 175 12/12/2019   HDL 57 12/12/2019   LDLCALC 104 (H) 12/12/2019   TRIG 76 12/12/2019   CHOLHDL 3.2 10/14/2018   Lab Results  Component Value Date   WBC 6.3 04/24/2020   HGB 11.7 (L) 04/24/2020   HCT 36.7 04/24/2020   MCV 79.8 (L) 04/24/2020   PLT 347 04/24/2020    Attestation  Statements:   Reviewed by clinician on day of visit: allergies, medications, problem list, medical history, surgical history, family history, social history, and previous encounter notes.  Coral Ceo, am acting as Location manager for [employer], [place].  I have reviewed the above documentation for accuracy and completeness, and I agree with the above. -  Georgianne Fick, FNP

## 2020-06-13 ENCOUNTER — Ambulatory Visit (INDEPENDENT_AMBULATORY_CARE_PROVIDER_SITE_OTHER): Payer: BC Managed Care – PPO | Admitting: Family Medicine

## 2020-06-13 ENCOUNTER — Encounter (INDEPENDENT_AMBULATORY_CARE_PROVIDER_SITE_OTHER): Payer: Self-pay

## 2020-07-04 ENCOUNTER — Ambulatory Visit (INDEPENDENT_AMBULATORY_CARE_PROVIDER_SITE_OTHER): Payer: BC Managed Care – PPO | Admitting: Bariatrics

## 2020-07-24 ENCOUNTER — Ambulatory Visit (INDEPENDENT_AMBULATORY_CARE_PROVIDER_SITE_OTHER): Payer: BC Managed Care – PPO | Admitting: Family Medicine

## 2020-07-24 ENCOUNTER — Other Ambulatory Visit: Payer: Self-pay

## 2020-07-24 ENCOUNTER — Encounter (INDEPENDENT_AMBULATORY_CARE_PROVIDER_SITE_OTHER): Payer: Self-pay | Admitting: Family Medicine

## 2020-07-24 VITALS — BP 108/72 | HR 81 | Temp 98.2°F | Ht 62.0 in | Wt 229.0 lb

## 2020-07-24 DIAGNOSIS — R7303 Prediabetes: Secondary | ICD-10-CM

## 2020-07-24 DIAGNOSIS — E8881 Metabolic syndrome: Secondary | ICD-10-CM

## 2020-07-24 DIAGNOSIS — E559 Vitamin D deficiency, unspecified: Secondary | ICD-10-CM | POA: Diagnosis not present

## 2020-07-24 DIAGNOSIS — Z6841 Body Mass Index (BMI) 40.0 and over, adult: Secondary | ICD-10-CM | POA: Diagnosis not present

## 2020-07-24 MED ORDER — VITAMIN D (ERGOCALCIFEROL) 1.25 MG (50000 UNIT) PO CAPS: 50000.0000 [IU] | ORAL_CAPSULE | ORAL | 0 refills | Status: DC

## 2020-07-24 MED ORDER — OZEMPIC (1 MG/DOSE) 2 MG/1.5ML ~~LOC~~ SOPN: 1.0000 mg | PEN_INJECTOR | SUBCUTANEOUS | 0 refills | Status: DC

## 2020-07-25 NOTE — Progress Notes (Signed)
Chief Complaint:   OBESITY Ariana Mcdaniel is here to discuss her progress with her obesity treatment plan along with follow-up of her obesity related diagnoses. Ariana Mcdaniel is on the Category 4 Plan and states she is following her eating plan approximately 50% of the time. Ariana Mcdaniel states she is not currently exercising.  Today's visit was #: 21 Starting weight: 239 lbs Starting date: 04/20/2019 Today's weight: 229 lbs Today's date: 07/24/2020 Total lbs lost to date: 10 Total lbs lost since last in-office visit: 0  Interim History: Ariana Mcdaniel has not been into the office since 05/22/2020. She is up 9 lbs today. She is on plan at breakfast and lunch. She tends to fall off plan at supper. She has not been shopping recently to have the groceries she needs.  Subjective:   1. Insulin resistance Ariana Mcdaniel reports hunger. She is on Ozempic 0.5 mg. She notes increased GERD from the [place]. Her A1c has increased to 5.6 from 5.3.  Lab Results  Component Value Date   HGBA1C 5.6 05/22/2020   Lab Results  Component Value Date   INSULIN 12.0 05/22/2020   INSULIN 15.2 12/12/2019   INSULIN 17.7 04/20/2019   2. Vitamin D deficiency Ariana Mcdaniel's last Vit D was 26.8 (down from 45.6). She has been compliant with weekly prescription Vit D.  Assessment/Plan:   1. Insulin resistance -Increase dose of Ozempic 1 mg, as prescribed below. _ she may take OTC Omeprazole 20 mg BID if needed.  - Semaglutide, 1 MG/DOSE, (OZEMPIC, 1 MG/DOSE,) 2 MG/1.5ML SOPN; Inject 1 mg into the skin once a week.  Dispense: 1.5 mL; Refill: 0  2. Vitamin D deficiency  She agrees to continue to take prescription Vitamin D @50 ,000 IU every week and will follow-up for routine testing of Vitamin D, at least 2-3 times per year to avoid over-replacement.  - Vitamin D, Ergocalciferol, (DRISDOL) 1.25 MG (50000 UNIT) CAPS capsule; Take 1 capsule (50,000 Units total) by mouth every 7 (seven) days.  Dispense: 5 capsule; Refill: 0  3. Obesity: Current  BMI 40.23  Ariana Mcdaniel is currently in the action stage of change. As such, her goal is to continue with weight loss efforts. She has agreed to the Category 4 Plan.   Exercise goals: No exercise has been prescribed at this time.  Behavioral modification strategies: increasing lean protein intake, decreasing simple carbohydrates, meal planning and cooking strategies and keeping healthy foods in the home.  Ariana Mcdaniel has agreed to follow-up with our clinic in 5 weeks.   Objective:   Blood pressure 108/72, pulse 81, temperature 98.2 F (36.8 C), height 5\' 2"  (1.575 m), weight 229 lb (103.9 kg), SpO2 98 %. Body mass index is 41.88 kg/m.  General: Cooperative, alert, well developed, in no acute distress. HEENT: Conjunctivae and lids unremarkable. Cardiovascular: Regular rhythm.  Lungs: Normal work of breathing. Neurologic: No focal deficits.   Lab Results  Component Value Date   CREATININE 0.70 04/24/2020   BUN 14 04/24/2020   NA 139 04/24/2020   K 3.9 04/24/2020   CL 105 04/24/2020   CO2 25 04/24/2020   Lab Results  Component Value Date   ALT 27 04/24/2020   AST 18 04/24/2020   ALKPHOS 66 04/24/2020   BILITOT 0.3 04/24/2020   Lab Results  Component Value Date   HGBA1C 5.6 05/22/2020   HGBA1C 5.3 12/12/2019   HGBA1C 5.7 (H) 04/20/2019   HGBA1C 5.6 10/14/2018   Lab Results  Component Value Date   INSULIN 12.0 05/22/2020  INSULIN 15.2 12/12/2019   INSULIN 17.7 04/20/2019   Lab Results  Component Value Date   TSH 1.740 10/14/2018   Lab Results  Component Value Date   CHOL 175 12/12/2019   HDL 57 12/12/2019   LDLCALC 104 (H) 12/12/2019   TRIG 76 12/12/2019   CHOLHDL 3.2 10/14/2018   Lab Results  Component Value Date   WBC 6.3 04/24/2020   HGB 11.7 (L) 04/24/2020   HCT 36.7 04/24/2020   MCV 79.8 (L) 04/24/2020   PLT 347 04/24/2020    Attestation Statements:   Reviewed by clinician on day of visit: allergies, medications, problem list, medical history, surgical  history, family history, social history, and previous encounter notes.  Coral Ceo, CMA, am acting as Location manager for [employer], [place].  I have reviewed the above documentation for accuracy and completeness, and I agree with the above. -  Georgianne Fick, FNP

## 2020-07-26 ENCOUNTER — Encounter (INDEPENDENT_AMBULATORY_CARE_PROVIDER_SITE_OTHER): Payer: Self-pay | Admitting: Family Medicine

## 2020-08-13 ENCOUNTER — Telehealth: Payer: BC Managed Care – PPO | Admitting: Emergency Medicine

## 2020-08-13 DIAGNOSIS — R1032 Left lower quadrant pain: Secondary | ICD-10-CM

## 2020-08-13 DIAGNOSIS — R300 Dysuria: Secondary | ICD-10-CM

## 2020-08-13 DIAGNOSIS — R112 Nausea with vomiting, unspecified: Secondary | ICD-10-CM

## 2020-08-13 NOTE — Progress Notes (Signed)
Based on what you shared with me, worsening symptoms after a few days of feeling better and constant left lower abdominal pain, I feel your condition warrants further evaluation and I recommend that you be seen in a face to face office visit.   NOTE: If you entered your credit card information for this eVisit, you will not be charged. You may see a "hold" on your card for the $35 but that hold will drop off and you will not have a charge processed.   If you are having a true medical emergency please call 911.      For an urgent face to face visit, [place] has six urgent care centers for your convenience:     [facility] [place] [facility] [place] Get Driving Directions [phone] [address] [place], [place] [address] . 8 am - 4 pm Monday - Friday    [facility]) Get Driving Directions [phone] [address] [place], [place] [address] . 8 am to 8 pm Monday-Friday . 10 am to 6 pm [facility]) Get Driving Directions [phone]  [address] [place],  [place]  [address] . 8 am to 8 pm Monday-Friday . 8 am to 4 pm [facility] Get Driving Directions [phone] [place], [place] [address] . 8 am to 8 pm Monday-Friday . 8 am to 4 pm [facility] Get Driving Directions  [phone] [address] [place], [place] [address] . 8 am to 8 pm Monday-Friday . 8 am to 4 pm [facility] at [place] Get Driving Directions [phone] [address] [place], [place] [address] . 8 am to 8 pm Monday-Friday . 8 am to 4 pm Saturday-Sunday     Your MyChart E-visit questionnaire answers were reviewed by a board certified advanced clinical practitioner to complete your  personal care plan based on your specific symptoms.  Thank you for using e-Visits.    Approximately 5 minutes was spent documenting and reviewing patient's chart.

## 2020-08-20 ENCOUNTER — Other Ambulatory Visit: Payer: Self-pay

## 2020-08-20 ENCOUNTER — Ambulatory Visit: Payer: BC Managed Care – PPO | Admitting: Internal Medicine

## 2020-08-20 ENCOUNTER — Telehealth: Payer: Self-pay

## 2020-08-20 ENCOUNTER — Encounter: Payer: Self-pay | Admitting: Internal Medicine

## 2020-08-20 DIAGNOSIS — R197 Diarrhea, unspecified: Secondary | ICD-10-CM | POA: Insufficient documentation

## 2020-08-20 MED ORDER — HYOSCYAMINE SULFATE 0.125 MG PO TABS: 0.1250 mg | ORAL_TABLET | Freq: Three times a day (TID) | ORAL | 0 refills | Status: DC | PRN

## 2020-08-20 NOTE — Assessment & Plan Note (Signed)
Has IBS type pattern of cramping after eating and then diarrhea Time course suggests this is from the semaglutide---though it has persisted 2 weeks after it should be out of her system Nothing to suggest IBD Not consistent with diverticular disease Will try hyoscyamine instead of the imodium GI referral [facility]) if not improving soon

## 2020-08-20 NOTE — Progress Notes (Signed)
Subjective:    Patient ID: Ariana Mcdaniel, female    DOB: [birth year], 45 y.o.   MRN: [id number]  HPI Here due to GI problems This visit occurred during the SARS-CoV-2 public health emergency.  Safety protocols were in place, including screening questions prior to the visit, additional usage of staff PPE, and extensive cleaning of exam room while observing appropriate contact time as indicated for disinfecting solutions.   Started about 3 weeks ago--had been taking ozempic Tried an increased dose (to 1mg ) on Sunday---next day vomiting and diarrhea This did resolve later in the day  Then 4 days later--started again with diarrhea Worsened over the next 2 days---totally liquid and "constant" Couldn't eat or drink (nausea even with water) Did slowly improve after 1-2 days (crackers and water) Progressed to [place] sandwiches and water after that  Diarrhea recurred later Tried imodium but still going every 30 minutes Then AM left sided cramping and diarrhea One imodium in AM allowed her to go out (vacationing in [place]) Only occasional other stools during the day No blood  Current Outpatient Medications on File Prior to Visit  Medication Sig Dispense Refill   cetirizine (ZYRTEC) 5 MG tablet Take 5 mg by mouth daily.     famotidine (PEPCID) 10 MG tablet Take 10 mg by mouth 2 (two) times daily.     omeprazole (PRILOSEC OTC) 20 MG tablet Take 20 mg by mouth daily.     Vitamin D, Ergocalciferol, (DRISDOL) 1.25 MG (50000 UNIT) CAPS capsule Take 1 capsule (50,000 Units total) by mouth every 7 (seven) days. 5 capsule 0   No current facility-administered medications on file prior to visit.    Allergies  Allergen Reactions   Wellbutrin [Bupropion] Hives   Amoxicillin     itching   Clindamycin/Lincomycin     Caused a superinfection   Oxycodone-Acetaminophen Hives    Past Medical History:  Diagnosis Date   Allergic rhinitis due to pollen    DVT (deep venous thrombosis) ([place]) 01/2017    Fatty liver    GERD (gastroesophageal reflux disease)    Mastodynia    Palpitations    PONV (postoperative nausea and vomiting)    Renal stones     Past Surgical History:  Procedure Laterality Date   BREAST LUMPECTOMY WITH RADIOACTIVE SEED LOCALIZATION Left 04/25/2020   Procedure: LEFT BREAST LUMPECTOMY WITH RADIOACTIVE SEED LOCALIZATION;  Surgeon: Erroll Luna, MD;  Location: [facility];  Service: General;  Laterality: Left;   TONSILLECTOMY     WISDOM TOOTH EXTRACTION      Family History  Problem Relation Age of Onset   Hypertension Maternal Grandmother    Diabetes Maternal Grandmother    Depression Paternal Grandmother    High blood pressure Father    High Cholesterol Father    Thyroid disease Mother    Cancer Paternal Grandfather        non-hodgkin's lymphoma   Heart disease Neg Hx     Social History   Socioeconomic History   Marital status: Married    Spouse name: Mitzi Hansen   Number of children: 2   Years of education: Not on file   Highest education level: Not on file  Occupational History   Occupation: Health visitor    Employer: [employer]  Tobacco Use   Smoking status: Former    Pack years: 0.00    Types: Cigarettes    Quit date: 01/04/2010    Years since quitting: 10.6   Smokeless tobacco: Never  Vaping  Use   Vaping Use: Never used  Substance and Sexual Activity   Alcohol use: Yes    Alcohol/week: 2.0 standard drinks    Types: 2 Cans of beer per week   Drug use: No   Sexual activity: Yes    Partners: Male    Birth control/protection: None    Comment: husband had Vasectomy  Other Topics Concern   Not on file  Social History Narrative   2nd marriage   Social Determinants of Health   Financial Resource Strain: Not on file  Food Insecurity: Not on file  Transportation Needs: Not on file  Physical Activity: Not on file  Stress: Not on file  Social Connections: Not on file  Intimate Partner Violence: Not on file    Review of Systems No weight loss during this Ozempic didn't really cut her appetite No urinary urgency, dysuria, etc (since the first 2 days) Went back to work today---still having symptoms    Objective:   Physical Exam Constitutional:      Appearance: Normal appearance.  Cardiovascular:     Rate and Rhythm: Normal rate and regular rhythm.     Heart sounds: No murmur heard.   No gallop.  Pulmonary:     Effort: Pulmonary effort is normal.     Breath sounds: Normal breath sounds. No wheezing or rales.  Abdominal:     Palpations: Abdomen is soft. There is no mass.     Tenderness: There is no abdominal tenderness. There is no guarding.     Comments: Decreased bowel sounds  Musculoskeletal:     Cervical back: Neck supple.  Lymphadenopathy:     Cervical: No cervical adenopathy.  Neurological:     Mental Status: She is alert.           Assessment & Plan:

## 2020-08-20 NOTE — Telephone Encounter (Signed)
I was unable to speak with pt; v/m on home phone said not accepting calls at this time and no answer on other 2 lines for pt. I spoke with pts husband and pt is a Scientist, clinical (histocompatibility and immunogenetics) of court and went to work today but may not be able to answer phone at work. Pts husband said after e visit on 08/13/20 where pt was advised to go to a face to face appt pts husband said they were in [facility] and pt did not got to UC  or ED. Pt's husband said that pt is in pain; Mr Germano not sure of level of pain but thinks might be consistent pain because he knows she is hurting but pt tries to hide her pain. When pts husband speaks with pt (he just texted her but has not gotten response yet) he will let her know she does need face to face visit and well let pt know she needs to go to [facility] or ED. No available appts at [facility]. Sending note to DR Silvio Pate who will be in office this afternoon and [facility] CMA.

## 2020-08-20 NOTE — Telephone Encounter (Signed)
Looks like she is scheduled with Dr Damita Dunnings tomorrow at 12pm.

## 2020-08-20 NOTE — Telephone Encounter (Signed)
[facility] - Client TELEPHONE ADVICE RECORD [facility] Patient Name: Ariana Mcdaniel YN Gender: Female DOB: 10-20-1975 Age: 45 Y 20 M 21 D Return Phone Number: [phone] (Primary), [phone] (Secondary) Address: [place] [address] Client [facility] - Client Client Site [place] - Day Physician Viviana Simpler- MD Contact Type Call Who Is Calling Patient / Member / Family / Caregiver Call Type Triage / Clinical Relationship To Patient Self Return Phone Number [phone] (Primary) Chief Complaint Flank Pain Reason for Call Symptomatic / Request for [place] states she is calling from office with pt on the line who is having pain in her left side and diarrhea. She has had urine output in the last 8 hours. Additional Comment caller disconnected prior to being placed in nonurgent holding queue. Translation No Disp. Time Eilene Ghazi Time) Disposition Final User 08/20/2020 9:16:53 AM Attempt made - message left Doyle Askew RN, [facility] 08/20/2020 9:28:45 AM Attempt made - no message left Sunday Corn, [facility] 08/20/2020 9:42:47 AM FINAL ATTEMPT MADE - message left Yes Doyle Askew, RN, [employer]

## 2020-08-20 NOTE — Telephone Encounter (Signed)
Michaela Corner scheduled pt.

## 2020-08-21 ENCOUNTER — Ambulatory Visit: Payer: BC Managed Care – PPO | Admitting: Family Medicine

## 2020-08-29 ENCOUNTER — Other Ambulatory Visit: Payer: Self-pay

## 2020-08-29 ENCOUNTER — Encounter (INDEPENDENT_AMBULATORY_CARE_PROVIDER_SITE_OTHER): Payer: Self-pay | Admitting: Family Medicine

## 2020-08-29 ENCOUNTER — Ambulatory Visit (INDEPENDENT_AMBULATORY_CARE_PROVIDER_SITE_OTHER): Payer: BC Managed Care – PPO | Admitting: Family Medicine

## 2020-08-29 VITALS — BP 137/78 | HR 83 | Temp 98.0°F | Ht 62.0 in | Wt 232.0 lb

## 2020-08-29 DIAGNOSIS — E559 Vitamin D deficiency, unspecified: Secondary | ICD-10-CM | POA: Diagnosis not present

## 2020-08-29 DIAGNOSIS — R7303 Prediabetes: Secondary | ICD-10-CM | POA: Diagnosis not present

## 2020-08-29 DIAGNOSIS — Z6841 Body Mass Index (BMI) 40.0 and over, adult: Secondary | ICD-10-CM

## 2020-08-29 MED ORDER — VITAMIN D (ERGOCALCIFEROL) 1.25 MG (50000 UNIT) PO CAPS: 50000.0000 [IU] | ORAL_CAPSULE | ORAL | 0 refills | Status: DC

## 2020-09-04 ENCOUNTER — Encounter (INDEPENDENT_AMBULATORY_CARE_PROVIDER_SITE_OTHER): Payer: Self-pay | Admitting: Family Medicine

## 2020-09-04 NOTE — Progress Notes (Signed)
Chief Complaint:   OBESITY Ariana Mcdaniel is here to discuss her progress with her obesity treatment plan along with follow-up of her obesity related diagnoses. Ariana Mcdaniel is on the Category 4 Plan and states she is following her eating plan approximately 30% of the time. Ariana Mcdaniel states she is walking for 30-60 minutes 5 times per week.  Today's visit was #: 22 Starting weight: 239 lbs Starting date: 04/20/2019 Today's weight: 232 lbs Today's date: 08/29/2020 Total lbs lost to date: 7 Total lbs lost since last in-office visit: +3  Interim History: Ariana Mcdaniel has been mostly off the plan. She has traveled to [place] and [place] on vacation recently. She feels she needs to "reset" and she plans on doing this. She had to stop Ozempic secondary to severe diarrhea. She would like to start coming to the clinic every 2 weeks.  Subjective:   1. Vitamin D deficiency Jinny's last Vit D level was low at 26.8. She is on prescription Vit D 50,000 IU weekly.  2. Pre-diabetes Clotiel stopped Ozempic due to diarrhea. She has been off of it for a month. Her diarrhea is improving.  Metformin caused side effects.   Lab Results  Component Value Date   HGBA1C 5.6 05/22/2020   Lab Results  Component Value Date   INSULIN 12.0 05/22/2020   INSULIN 15.2 12/12/2019   INSULIN 17.7 04/20/2019   Assessment/Plan:   1. Vitamin D deficiency Low Vitamin D level contributes to fatigue and are associated with obesity, breast, and colon cancer. We will refill prescription Vitamin D 50,000 IU every week for 1 month. Ariana Mcdaniel will follow-up for routine testing of Vitamin D, at least 2-3 times per year to avoid over-replacement.  - Vitamin D, Ergocalciferol, (DRISDOL) 1.25 MG (50000 UNIT) CAPS capsule; Take 1 capsule (50,000 Units total) by mouth every 7 (seven) days.  Dispense: 5 capsule; Refill: 0  2. Pre-diabetes Ariana Mcdaniel will continue her meal plan, and will continue to work on weight loss, exercise, and decreasing simple  carbohydrates to help decrease the risk of diabetes.   3. Obesity: Current BMI 42.42 Ariana Mcdaniel is currently in the action stage of change. As such, her goal is to continue with weight loss efforts. She has agreed to the Category 4 Plan.   Exercise goals: As is.  Behavioral modification strategies: increasing lean protein intake and decreasing simple carbohydrates.  Ariana Mcdaniel has agreed to follow-up with our clinic in 3 weeks.  Objective:   Blood pressure 137/78, pulse 83, temperature 98 F (36.7 C), height 5\' 2"  (1.575 m), weight 232 lb (105.2 kg), SpO2 98 %. Body mass index is 42.43 kg/m.  General: Cooperative, alert, well developed, in no acute distress. HEENT: Conjunctivae and lids unremarkable. Cardiovascular: Regular rhythm.  Lungs: Normal work of breathing. Neurologic: No focal deficits.   Lab Results  Component Value Date   CREATININE 0.70 04/24/2020   BUN 14 04/24/2020   NA 139 04/24/2020   K 3.9 04/24/2020   CL 105 04/24/2020   CO2 25 04/24/2020   Lab Results  Component Value Date   ALT 27 04/24/2020   AST 18 04/24/2020   ALKPHOS 66 04/24/2020   BILITOT 0.3 04/24/2020   Lab Results  Component Value Date   HGBA1C 5.6 05/22/2020   HGBA1C 5.3 12/12/2019   HGBA1C 5.7 (H) 04/20/2019   HGBA1C 5.6 10/14/2018   Lab Results  Component Value Date   INSULIN 12.0 05/22/2020   INSULIN 15.2 12/12/2019   INSULIN 17.7 04/20/2019   Lab Results  Component Value Date   TSH 1.740 10/14/2018   Lab Results  Component Value Date   CHOL 175 12/12/2019   HDL 57 12/12/2019   LDLCALC 104 (H) 12/12/2019   TRIG 76 12/12/2019   CHOLHDL 3.2 10/14/2018   Lab Results  Component Value Date   WBC 6.3 04/24/2020   HGB 11.7 (L) 04/24/2020   HCT 36.7 04/24/2020   MCV 79.8 (L) 04/24/2020   PLT 347 04/24/2020   No results found for: IRON, TIBC, FERRITIN  Attestation Statements:   Reviewed by clinician on day of visit: allergies, medications, problem list, medical history,  surgical history, family history, social history, and previous encounter notes.   Wilhemena Durie, am acting as Location manager for [employer], FNP-C.  I have reviewed the above documentation for accuracy and completeness, and I agree with the above. -  Georgianne Fick, FNP

## 2020-09-07 ENCOUNTER — Other Ambulatory Visit: Payer: Self-pay | Admitting: Family Medicine

## 2020-09-07 DIAGNOSIS — Z1231 Encounter for screening mammogram for malignant neoplasm of breast: Secondary | ICD-10-CM

## 2020-09-18 ENCOUNTER — Other Ambulatory Visit: Payer: Self-pay

## 2020-09-18 ENCOUNTER — Ambulatory Visit (INDEPENDENT_AMBULATORY_CARE_PROVIDER_SITE_OTHER): Payer: BC Managed Care – PPO | Admitting: Family Medicine

## 2020-09-18 ENCOUNTER — Encounter (INDEPENDENT_AMBULATORY_CARE_PROVIDER_SITE_OTHER): Payer: Self-pay | Admitting: Family Medicine

## 2020-09-18 VITALS — BP 136/90 | HR 84 | Temp 98.5°F | Ht 62.0 in | Wt 230.0 lb

## 2020-09-18 DIAGNOSIS — E559 Vitamin D deficiency, unspecified: Secondary | ICD-10-CM | POA: Diagnosis not present

## 2020-09-18 DIAGNOSIS — R030 Elevated blood-pressure reading, without diagnosis of hypertension: Secondary | ICD-10-CM | POA: Diagnosis not present

## 2020-09-18 DIAGNOSIS — Z6841 Body Mass Index (BMI) 40.0 and over, adult: Secondary | ICD-10-CM

## 2020-09-18 MED ORDER — VITAMIN D (ERGOCALCIFEROL) 1.25 MG (50000 UNIT) PO CAPS: 50000.0000 [IU] | ORAL_CAPSULE | ORAL | 0 refills | Status: DC

## 2020-09-20 NOTE — Progress Notes (Signed)
Chief Complaint:   OBESITY Ariana Mcdaniel is here to discuss her progress with her obesity treatment plan along with follow-up of her obesity related diagnoses. Ariana Mcdaniel is on the Category 4 Plan and states she is following her eating plan approximately 90% of the time. Ariana Mcdaniel states she is walking 15-30 minutes 5 times per week.  Today's visit was #: 23 Starting weight: 239 lbs Starting date: 04/20/2019 Today's weight: 230 lbs Today's date: 09/18/2020 Total lbs lost to date: 9 Total lbs lost since last in-office visit: 2  Interim History: Ariana Mcdaniel is doing very well on plan and is down 2 lbs. She notes hunger is satisfied when she sticks to the plan. She feels cravings are well controlled. She is getting in all of her protein. She and her husband are cooking supper in the evenings rather than eating out.  Subjective:   1. Vitamin D deficiency Ariana Mcdaniel's Vit D is low at 26.8. She is currently taking prescription vitamin D 50,000 IU each week.   Lab Results  Component Value Date   VD25OH 26.8 (L) 05/22/2020   VD25OH 45.6 12/12/2019   VD25OH 19.3 (L) 04/20/2019   2. Elevated blood pressure reading without diagnosis of hypertension Diastolic BP slightly elevated at 90. Ariana Mcdaniel's BP is 136/90 today. Her BP is usually well WNL.  BP Readings from Last 3 Encounters:  09/18/20 136/90  08/29/20 137/78  08/20/20 104/66   Assessment/Plan:   1. Vitamin D deficiency We will refill prescription Vit D, as per below.  Refill- Vitamin D, Ergocalciferol, (DRISDOL) 1.25 MG (50000 UNIT) CAPS capsule; Take 1 capsule (50,000 Units total) by mouth every 7 (seven) days.  Dispense: 5 capsule; Refill: 0  2. Elevated blood pressure reading without diagnosis of hypertension Continue to monitor.  3. Obesity: Current BMI 42.06  Ariana Mcdaniel is currently in the action stage of change. As such, her goal is to continue with weight loss efforts. She has agreed to the Category 4 Plan.   Exercise goals:  As  is  Behavioral modification strategies: planning for success.  Ariana Mcdaniel has agreed to follow-up with our clinic in 2 weeks.  Objective:   Blood pressure 136/90, pulse 84, temperature 98.5 F (36.9 C), height 5\' 2"  (1.575 m), weight 230 lb (104.3 kg), last menstrual period 09/11/2020, SpO2 97 %. Body mass index is 42.07 kg/m.  General: Cooperative, alert, well developed, in no acute distress. HEENT: Conjunctivae and lids unremarkable. Cardiovascular: Regular rhythm.  Lungs: Normal work of breathing. Neurologic: No focal deficits.   Lab Results  Component Value Date   CREATININE 0.70 04/24/2020   BUN 14 04/24/2020   NA 139 04/24/2020   K 3.9 04/24/2020   CL 105 04/24/2020   CO2 25 04/24/2020   Lab Results  Component Value Date   ALT 27 04/24/2020   AST 18 04/24/2020   ALKPHOS 66 04/24/2020   BILITOT 0.3 04/24/2020   Lab Results  Component Value Date   HGBA1C 5.6 05/22/2020   HGBA1C 5.3 12/12/2019   HGBA1C 5.7 (H) 04/20/2019   HGBA1C 5.6 10/14/2018   Lab Results  Component Value Date   INSULIN 12.0 05/22/2020   INSULIN 15.2 12/12/2019   INSULIN 17.7 04/20/2019   Lab Results  Component Value Date   TSH 1.740 10/14/2018   Lab Results  Component Value Date   CHOL 175 12/12/2019   HDL 57 12/12/2019   LDLCALC 104 (H) 12/12/2019   TRIG 76 12/12/2019   CHOLHDL 3.2 10/14/2018   Lab Results  Component Value Date   VD25OH 26.8 (L) 05/22/2020   VD25OH 45.6 12/12/2019   VD25OH 19.3 (L) 04/20/2019   Lab Results  Component Value Date   WBC 6.3 04/24/2020   HGB 11.7 (L) 04/24/2020   HCT 36.7 04/24/2020   MCV 79.8 (L) 04/24/2020   PLT 347 04/24/2020    Attestation Statements:   Reviewed by clinician on day of visit: allergies, medications, problem list, medical history, surgical history, family history, social history, and previous encounter notes.  Coral Ceo, CMA, am acting as Location manager for [employer], [place].  I have reviewed the above  documentation for accuracy and completeness, and I agree with the above. -  Georgianne Fick, FNP

## 2020-09-24 ENCOUNTER — Encounter (INDEPENDENT_AMBULATORY_CARE_PROVIDER_SITE_OTHER): Payer: Self-pay | Admitting: Family Medicine

## 2020-10-01 ENCOUNTER — Encounter (INDEPENDENT_AMBULATORY_CARE_PROVIDER_SITE_OTHER): Payer: Self-pay

## 2020-10-02 ENCOUNTER — Encounter (INDEPENDENT_AMBULATORY_CARE_PROVIDER_SITE_OTHER): Payer: Self-pay | Admitting: Family Medicine

## 2020-10-02 ENCOUNTER — Ambulatory Visit (INDEPENDENT_AMBULATORY_CARE_PROVIDER_SITE_OTHER): Payer: BC Managed Care – PPO | Admitting: Family Medicine

## 2020-10-02 ENCOUNTER — Other Ambulatory Visit: Payer: Self-pay

## 2020-10-02 VITALS — BP 111/70 | HR 78 | Temp 98.1°F | Ht 62.0 in | Wt 229.0 lb

## 2020-10-02 DIAGNOSIS — K219 Gastro-esophageal reflux disease without esophagitis: Secondary | ICD-10-CM

## 2020-10-02 DIAGNOSIS — E559 Vitamin D deficiency, unspecified: Secondary | ICD-10-CM | POA: Diagnosis not present

## 2020-10-02 DIAGNOSIS — Z6841 Body Mass Index (BMI) 40.0 and over, adult: Secondary | ICD-10-CM | POA: Diagnosis not present

## 2020-10-06 NOTE — Progress Notes (Signed)
Chief Complaint:   OBESITY Ariana Mcdaniel is here to discuss her progress with her obesity treatment plan along with follow-up of her obesity related diagnoses. Ariana Mcdaniel is on the Category 4 Plan and states she is following her eating plan approximately 90% of the time. Ariana Mcdaniel states she is walking for 15-30 minutes 5 times per week.  Today's visit was #: 24 Starting weight: 239 lbs Starting date: 04/20/2019 Today's weight: 229 lbs Today's date: 10/02/2020 Total lbs lost to date: 10 lbs Total lbs lost since last in-office visit: 1 lb  Interim History: Ariana Mcdaniel is adhering to the plan well. She feels huger is mostly satisfied. She does have sugar sweetened beverages at times. She  Is making good choices when not on plan. She is getting in all protein. She walks 15-30 minutes 5 times per week at work. She does not skip meals generally.   Subjective:   1. Vitamin D deficiency Ariana Mcdaniel's Vitamin D level was low at 26.8. She often forgets to take her prescription Vitamin D.  Lab Results  Component Value Date   VD25OH 26.8 (L) 05/22/2020   VD25OH 45.6 12/12/2019   VD25OH 19.3 (L) 04/20/2019    2. Gastroesophageal reflux disease, unspecified whether esophagitis present Ariana Mcdaniel is taking Omeprazole daily. Her GERD is well controlled.   Assessment/Plan:   1. Vitamin D deficiency I discussed ways with Ariana Mcdaniel to be more compliant with Vitamin D. She agrees to continue to take prescription Vitamin D 50,000 IU every week and will follow-up for routine testing of Vitamin D, at least 2-3 times per year to avoid over-replacement.   2. Gastroesophageal reflux disease, unspecified whether esophagitis present Ariana Mcdaniel will continue taking Omeprazole. Intensive lifestyle modifications are the first line treatment for this issue. We discussed several lifestyle modifications today and she will continue to work on diet, exercise and weight loss efforts.  3. Obesity: Current BMI 41.87 Ariana Mcdaniel is currently in the  action stage of change. As such, her goal is to continue with weight loss efforts. She has agreed to the Category 4 Plan and keeping a food journal and adhering to recommended goals of 1700-1800 calories and 100 grams of protein daily.  She will track calories and protein on days she does not follow Category 4.  Exercise goals:  As is.  Behavioral modification strategies: planning for success.  Ariana Mcdaniel has agreed to follow-up with our clinic in 2 weeks.  Objective:   Blood pressure 111/70, pulse 78, temperature 98.1 F (36.7 C), height '5\' 2"'$  (1.575 m), weight 229 lb (103.9 kg), last menstrual period 09/11/2020, SpO2 98 %. Body mass index is 41.88 kg/m.  General: Cooperative, alert, well developed, in no acute distress. HEENT: Conjunctivae and lids unremarkable. Cardiovascular: Regular rhythm.  Lungs: Normal work of breathing. Neurologic: No focal deficits.   Lab Results  Component Value Date   CREATININE 0.70 04/24/2020   BUN 14 04/24/2020   NA 139 04/24/2020   K 3.9 04/24/2020   CL 105 04/24/2020   CO2 25 04/24/2020   Lab Results  Component Value Date   ALT 27 04/24/2020   AST 18 04/24/2020   ALKPHOS 66 04/24/2020   BILITOT 0.3 04/24/2020   Lab Results  Component Value Date   HGBA1C 5.6 05/22/2020   HGBA1C 5.3 12/12/2019   HGBA1C 5.7 (H) 04/20/2019   HGBA1C 5.6 10/14/2018   Lab Results  Component Value Date   INSULIN 12.0 05/22/2020   INSULIN 15.2 12/12/2019   INSULIN 17.7 04/20/2019   Lab  Results  Component Value Date   TSH 1.740 10/14/2018   Lab Results  Component Value Date   CHOL 175 12/12/2019   HDL 57 12/12/2019   LDLCALC 104 (H) 12/12/2019   TRIG 76 12/12/2019   CHOLHDL 3.2 10/14/2018   Lab Results  Component Value Date   VD25OH 26.8 (L) 05/22/2020   VD25OH 45.6 12/12/2019   VD25OH 19.3 (L) 04/20/2019   Lab Results  Component Value Date   WBC 6.3 04/24/2020   HGB 11.7 (L) 04/24/2020   HCT 36.7 04/24/2020   MCV 79.8 (L) 04/24/2020   PLT  347 04/24/2020   No results found for: IRON, TIBC, FERRITIN  Attestation Statements:   Reviewed by clinician on day of visit: allergies, medications, problem list, medical history, surgical history, family history, social history, and previous encounter notes.  I, Lizbeth Bark, RMA, am acting as Location manager for [employer], [place].   I have reviewed the above documentation for accuracy and completeness, and I agree with the above. -  Georgianne Fick, FNP

## 2020-10-08 ENCOUNTER — Encounter (INDEPENDENT_AMBULATORY_CARE_PROVIDER_SITE_OTHER): Payer: Self-pay | Admitting: Family Medicine

## 2020-10-08 DIAGNOSIS — K219 Gastro-esophageal reflux disease without esophagitis: Secondary | ICD-10-CM | POA: Insufficient documentation

## 2020-10-16 ENCOUNTER — Ambulatory Visit (INDEPENDENT_AMBULATORY_CARE_PROVIDER_SITE_OTHER): Payer: BC Managed Care – PPO | Admitting: Family Medicine

## 2020-11-01 ENCOUNTER — Ambulatory Visit (INDEPENDENT_AMBULATORY_CARE_PROVIDER_SITE_OTHER): Payer: BC Managed Care – PPO | Admitting: Family Medicine

## 2020-11-01 ENCOUNTER — Other Ambulatory Visit: Payer: Self-pay

## 2020-11-01 VITALS — BP 129/81 | HR 69 | Temp 98.0°F | Ht 62.0 in | Wt 233.0 lb

## 2020-11-01 DIAGNOSIS — E559 Vitamin D deficiency, unspecified: Secondary | ICD-10-CM

## 2020-11-01 DIAGNOSIS — R7303 Prediabetes: Secondary | ICD-10-CM

## 2020-11-01 DIAGNOSIS — E7849 Other hyperlipidemia: Secondary | ICD-10-CM

## 2020-11-01 DIAGNOSIS — Z6841 Body Mass Index (BMI) 40.0 and over, adult: Secondary | ICD-10-CM

## 2020-11-01 MED ORDER — VITAMIN D (ERGOCALCIFEROL) 1.25 MG (50000 UNIT) PO CAPS
50000.0000 [IU] | ORAL_CAPSULE | ORAL | 0 refills | Status: DC
Start: 2020-11-01 — End: 2021-06-21

## 2020-11-01 NOTE — Progress Notes (Signed)
Chief Complaint:   OBESITY Charny is here to discuss her progress with her obesity treatment plan along with follow-up of her obesity related diagnoses. Sophie is on the Category 4 Plan and states she is following her eating plan approximately 50% of the time. Quorra states she is doing 0 minutes 0 times per week.  Today's visit was #: 25 Starting weight: 239 lbs Starting date: 04/20/2019 Today's weight: 233 lbs Today's date: 11/01/2020 Total lbs lost to date: 6 lbs Total lbs lost since last in-office visit: +4  Interim History: Charnita has been on a trip to [place] recently and has also been laid up with an sprained ankle. She is up 4 lbs today.  She is trying to get back on the plan.  Subjective:   1. Vitamin D deficiency Georgia has been compliant with Vitamin D. Her last Vitamin D was low at 26.8. She is on prescription Vitamin D.  Lab Results  Component Value Date   VD25OH 26.8 (L) 05/22/2020   VD25OH 45.6 12/12/2019   VD25OH 19.3 (L) 04/20/2019    2. Other hyperlipidemia Ron's last LDL was slightly elevated at 104. Her HDL and Triglycerides were within normal limits.  Lab Results  Component Value Date   CHOL 175 12/12/2019   HDL 57 12/12/2019   LDLCALC 104 (H) 12/12/2019   TRIG 76 12/12/2019   CHOLHDL 3.2 10/14/2018   Lab Results  Component Value Date   ALT 27 04/24/2020   AST 18 04/24/2020   ALKPHOS 66 04/24/2020   BILITOT 0.3 04/24/2020   The 10-year ASCVD risk score Mikey Bussing DC Jr., et al., 2013) is: 0.5%   Values used to calculate the score:     Age: 18 years     Sex: Female     Is Non-Hispanic African American: No     Diabetic: No     Tobacco smoker: No     Systolic Blood Pressure: [id number] mmHg     Is BP treated: No     HDL Cholesterol: 57 mg/dL     Total Cholesterol: 175 mg/dL   3. Pre-diabetes Haelyn's last A1C was 5.6. She hast stomach upset with GLP1 and Metformin.  Lab Results  Component Value Date   HGBA1C 5.6 05/22/2020   Lab Results   Component Value Date   INSULIN 12.0 05/22/2020   INSULIN 15.2 12/12/2019   INSULIN 17.7 04/20/2019    Assessment/Plan:   1. Vitamin D deficiency We will refill prescription Vitamin D 50,000 IU every week and Maitlyn will follow-up for routine testing of Vitamin D, at least 2-3 times per year to avoid over-replacement. We will check Vitamin D today.  - VITAMIN D 25 Hydroxy (Vit-D Deficiency, Fractures) - Vitamin D, Ergocalciferol, (DRISDOL) 1.25 MG (50000 UNIT) CAPS capsule; Take 1 capsule (50,000 Units total) by mouth every 7 (seven) days.  Dispense: 5 capsule; Refill: 0  2. Other hyperlipidemia  We will check FLP today.   - Lipid Panel With LDL/HDL Ratio  3. Pre-diabetes We will check A1C today. Zaineb will continue to work on weight loss, exercise, and decreasing simple carbohydrates to help decrease the risk of diabetes.   - Hemoglobin A1c - Insulin, random - Comprehensive metabolic panel  4. Obesity: Current BMI 42.61 Chao is currently in the action stage of change. As such, her goal is to continue with weight loss efforts. She has agreed to the Category 4 Plan and following a lower carbohydrate, vegetable and lean protein rich diet plan.  We discussed low carb plan. She will consider trying it. She feels she may have gluten intolerance.  Exercise goals: No exercise has been prescribed at this time.  Behavioral modification strategies: increasing lean protein intake and decreasing simple carbohydrates.  Ailany has agreed to follow-up with our clinic in 3-4 weeks.  Objective:   Blood pressure 129/81, pulse 69, temperature 98 F (36.7 C), height '5\' 2"'$  (1.575 m), weight 233 lb (105.7 kg), SpO2 98 %. Body mass index is 42.62 kg/m.  General: Cooperative, alert, well developed, in no acute distress. HEENT: Conjunctivae and lids unremarkable. Cardiovascular: Regular rhythm.  Lungs: Normal work of breathing. Neurologic: No focal deficits.   Lab Results  Component  Value Date   CREATININE 0.70 04/24/2020   BUN 14 04/24/2020   NA 139 04/24/2020   K 3.9 04/24/2020   CL 105 04/24/2020   CO2 25 04/24/2020   Lab Results  Component Value Date   ALT 27 04/24/2020   AST 18 04/24/2020   ALKPHOS 66 04/24/2020   BILITOT 0.3 04/24/2020   Lab Results  Component Value Date   HGBA1C 5.6 05/22/2020   HGBA1C 5.3 12/12/2019   HGBA1C 5.7 (H) 04/20/2019   HGBA1C 5.6 10/14/2018   Lab Results  Component Value Date   INSULIN 12.0 05/22/2020   INSULIN 15.2 12/12/2019   INSULIN 17.7 04/20/2019   Lab Results  Component Value Date   TSH 1.740 10/14/2018   Lab Results  Component Value Date   CHOL 175 12/12/2019   HDL 57 12/12/2019   LDLCALC 104 (H) 12/12/2019   TRIG 76 12/12/2019   CHOLHDL 3.2 10/14/2018   Lab Results  Component Value Date   VD25OH 26.8 (L) 05/22/2020   VD25OH 45.6 12/12/2019   VD25OH 19.3 (L) 04/20/2019   Lab Results  Component Value Date   WBC 6.3 04/24/2020   HGB 11.7 (L) 04/24/2020   HCT 36.7 04/24/2020   MCV 79.8 (L) 04/24/2020   PLT 347 04/24/2020   No results found for: IRON, TIBC, FERRITIN  Attestation Statements:   Reviewed by clinician on day of visit: allergies, medications, problem list, medical history, surgical history, family history, social history, and previous encounter notes.  I, Lizbeth Bark, RMA, am acting as Location manager for [employer], [place].   I have reviewed the above documentation for accuracy and completeness, and I agree with the above. -  Georgianne Fick, FNP

## 2020-11-02 LAB — COMPREHENSIVE METABOLIC PANEL
ALT: 18 IU/L (ref 0–32)
AST: 14 IU/L (ref 0–40)
Albumin/Globulin Ratio: 1.7 (ref 1.2–2.2)
Albumin: 4 g/dL (ref 3.8–4.8)
Alkaline Phosphatase: 81 IU/L (ref 44–121)
BUN/Creatinine Ratio: 14 (ref 9–23)
BUN: 10 mg/dL (ref 6–24)
Bilirubin Total: 0.2 mg/dL (ref 0.0–1.2)
CO2: 24 mmol/L (ref 20–29)
Calcium: 8.7 mg/dL (ref 8.7–10.2)
Chloride: 104 mmol/L (ref 96–106)
Creatinine, Ser: 0.71 mg/dL (ref 0.57–1.00)
Globulin, Total: 2.3 g/dL (ref 1.5–4.5)
Glucose: 95 mg/dL (ref 65–99)
Potassium: 4.9 mmol/L (ref 3.5–5.2)
Sodium: 139 mmol/L (ref 134–144)
Total Protein: 6.3 g/dL (ref 6.0–8.5)
eGFR: 109 mL/min/{1.73_m2} (ref >59)

## 2020-11-02 LAB — LIPID PANEL WITH LDL/HDL RATIO
Cholesterol, Total: 179 mg/dL (ref 100–199)
HDL: 55 mg/dL (ref >39)
LDL Chol Calc (NIH): 101 mg/dL — ABNORMAL HIGH (ref 0–99)
LDL/HDL Ratio: 1.8 ratio (ref 0.0–3.2)
Triglycerides: 133 mg/dL (ref 0–149)
VLDL Cholesterol Cal: 23 mg/dL (ref 5–40)

## 2020-11-02 LAB — HEMOGLOBIN A1C
Est. average glucose Bld gHb Est-mCnc: 120 mg/dL
Hgb A1c MFr Bld: 5.8 % — ABNORMAL HIGH (ref 4.8–5.6)

## 2020-11-02 LAB — INSULIN, RANDOM: INSULIN: 15.4 u[IU]/mL (ref 2.6–24.9)

## 2020-11-02 LAB — VITAMIN D 25 HYDROXY (VIT D DEFICIENCY, FRACTURES): Vit D, 25-Hydroxy: 34.9 ng/mL (ref 30.0–100.0)

## 2020-11-03 ENCOUNTER — Encounter (INDEPENDENT_AMBULATORY_CARE_PROVIDER_SITE_OTHER): Payer: Self-pay | Admitting: Family Medicine

## 2020-11-28 ENCOUNTER — Ambulatory Visit (INDEPENDENT_AMBULATORY_CARE_PROVIDER_SITE_OTHER): Payer: BC Managed Care – PPO | Admitting: Physician Assistant

## 2020-12-10 ENCOUNTER — Encounter: Payer: Self-pay | Admitting: Internal Medicine

## 2020-12-10 ENCOUNTER — Ambulatory Visit: Payer: BC Managed Care – PPO | Admitting: Internal Medicine

## 2020-12-10 ENCOUNTER — Other Ambulatory Visit: Payer: Self-pay

## 2020-12-10 VITALS — BP 130/72 | HR 83 | Temp 98.0°F | Ht 62.0 in | Wt 239.2 lb

## 2020-12-10 DIAGNOSIS — R197 Diarrhea, unspecified: Secondary | ICD-10-CM | POA: Diagnosis not present

## 2020-12-10 NOTE — Progress Notes (Signed)
Subjective:    Patient ID: Ariana Mcdaniel, female    DOB: 09/12/1975, 45 y.o.   MRN: [id number]  HPI Here for follow up of GI problems This visit occurred during the SARS-CoV-2 public health emergency.  Safety protocols were in place, including screening questions prior to the visit, additional usage of staff PPE, and extensive cleaning of exam room while observing appropriate contact time as indicated for disinfecting solutions.   Did try the hyoscyamine---but it didn't help Went back to imodium Now with various stomach issues--seems to be related to gluten intake Celiac does run in family She stays away from lactose--she does have diarrhea after cheese, etc (and lactaid "tore my stomach up")  Constipation in the past---till saxenda and ozempic Now never constipated  Current Outpatient Medications on File Prior to Visit  Medication Sig Dispense Refill   cetirizine (ZYRTEC) 5 MG tablet Take 5 mg by mouth daily.     famotidine (PEPCID) 10 MG tablet Take 10 mg by mouth 2 (two) times daily.     hyoscyamine (LEVSIN) 0.125 MG tablet Take 1 tablet (0.125 mg total) by mouth 3 (three) times daily as needed. 60 tablet 0   norethindrone (AYGESTIN) 5 MG tablet Take by mouth.     omeprazole (PRILOSEC OTC) 20 MG tablet Take 20 mg by mouth daily.     Vitamin D, Ergocalciferol, (DRISDOL) 1.25 MG (50000 UNIT) CAPS capsule Take 1 capsule (50,000 Units total) by mouth every 7 (seven) days. 5 capsule 0   No current facility-administered medications on file prior to visit.    Allergies  Allergen Reactions   Wellbutrin [Bupropion] Hives   Amoxicillin     itching   Clindamycin/Lincomycin     Caused a superinfection   Oxycodone-Acetaminophen Hives    Past Medical History:  Diagnosis Date   Allergic rhinitis due to pollen    DVT (deep venous thrombosis) ([place]) 01/2017   Fatty liver    GERD (gastroesophageal reflux disease)    Mastodynia    Palpitations    PONV (postoperative nausea and  vomiting)    Renal stones     Past Surgical History:  Procedure Laterality Date   BREAST LUMPECTOMY WITH RADIOACTIVE SEED LOCALIZATION Left 04/25/2020   Procedure: LEFT BREAST LUMPECTOMY WITH RADIOACTIVE SEED LOCALIZATION;  Surgeon: Erroll Luna, MD;  Location: [facility];  Service: General;  Laterality: Left;   TONSILLECTOMY     WISDOM TOOTH EXTRACTION      Family History  Problem Relation Age of Onset   Hypertension Maternal Grandmother    Diabetes Maternal Grandmother    Depression Paternal Grandmother    High blood pressure Father    High Cholesterol Father    Thyroid disease Mother    Cancer Paternal Grandfather        non-hodgkin's lymphoma   Heart disease Neg Hx     Social History   Socioeconomic History   Marital status: Married    Spouse name: Mitzi Hansen   Number of children: 2   Years of education: Not on file   Highest education level: Not on file  Occupational History   Occupation: Health visitor    Employer: [employer]  Tobacco Use   Smoking status: Former    Types: Cigarettes    Quit date: 01/04/2010    Years since quitting: 10.9   Smokeless tobacco: Never  Vaping Use   Vaping Use: Never used  Substance and Sexual Activity   Alcohol use: Yes    Alcohol/week: 2.0  standard drinks    Types: 2 Cans of beer per week   Drug use: No   Sexual activity: Yes    Partners: Male    Birth control/protection: None    Comment: husband had Vasectomy  Other Topics Concern   Not on file  Social History Narrative   2nd marriage   Social Determinants of Health   Financial Resource Strain: Not on file  Food Insecurity: Not on file  Transportation Needs: Not on file  Physical Activity: Not on file  Stress: Not on file  Social Connections: Not on file  Intimate Partner Violence: Not on file   Review of Systems No blood in stool Does get LLQ pain at times--not necessarily related to bowels Weight is up some    Objective:   Physical  Exam Constitutional:      Appearance: Normal appearance.  Pulmonary:     Effort: Pulmonary effort is normal.     Breath sounds: Normal breath sounds. No wheezing or rales.  Abdominal:     Palpations: Abdomen is soft.     Tenderness: There is no abdominal tenderness.  Musculoskeletal:     Cervical back: Neck supple.     Right lower leg: No edema.     Left lower leg: No edema.  Lymphadenopathy:     Cervical: No cervical adenopathy.  Neurological:     Mental Status: She is alert.           Assessment & Plan:

## 2020-12-10 NOTE — Assessment & Plan Note (Signed)
Ongoing worrisome symptoms History not really consistent with IBD--but possible Don't expect it could be neoplasm but at this point, she probably needs colonoscopy, so will set up with GI Clearly is lactose sensitive--she avoids it Will check celiac labs

## 2020-12-11 ENCOUNTER — Encounter: Payer: Self-pay | Admitting: Gastroenterology

## 2020-12-18 LAB — CELIAC PNL 2 RFLX ENDOMYSIAL AB TTR
(tTG) Ab, IgA: <1 U/mL
(tTG) Ab, IgG: <1 U/mL
Endomysial Ab IgA: NEGATIVE
Gliadin IgA: 4.3 U/mL
Gliadin IgG: <1 U/mL
Immunoglobulin A: 278 mg/dL (ref 47–310)

## 2020-12-24 ENCOUNTER — Other Ambulatory Visit: Payer: Self-pay

## 2020-12-24 ENCOUNTER — Ambulatory Visit
Admission: RE | Admit: 2020-12-24 | Discharge: 2020-12-24 | Disposition: A | Discharge status: Home / Self Care | Payer: BC Managed Care – PPO | Source: Ambulatory Visit | Attending: Family Medicine | Admitting: Family Medicine

## 2020-12-24 DIAGNOSIS — Z1231 Encounter for screening mammogram for malignant neoplasm of breast: Secondary | ICD-10-CM

## 2021-01-15 ENCOUNTER — Ambulatory Visit (INDEPENDENT_AMBULATORY_CARE_PROVIDER_SITE_OTHER): Payer: BC Managed Care – PPO | Admitting: Gastroenterology

## 2021-01-15 ENCOUNTER — Encounter: Payer: Self-pay | Admitting: Gastroenterology

## 2021-01-15 ENCOUNTER — Other Ambulatory Visit (INDEPENDENT_AMBULATORY_CARE_PROVIDER_SITE_OTHER): Payer: BC Managed Care – PPO

## 2021-01-15 ENCOUNTER — Other Ambulatory Visit: Payer: Self-pay

## 2021-01-15 VITALS — BP 122/84 | HR 74 | Ht 62.0 in | Wt 241.0 lb

## 2021-01-15 DIAGNOSIS — K219 Gastro-esophageal reflux disease without esophagitis: Secondary | ICD-10-CM | POA: Diagnosis not present

## 2021-01-15 DIAGNOSIS — K589 Irritable bowel syndrome without diarrhea: Secondary | ICD-10-CM

## 2021-01-15 LAB — IBC PANEL
Iron: 31 ug/dL — ABNORMAL LOW (ref 42–145)
Saturation Ratios: 6.9 % — ABNORMAL LOW (ref 20.0–50.0)
TIBC: 446.6 ug/dL (ref 250.0–450.0)
Transferrin: 319 mg/dL (ref 212.0–360.0)

## 2021-01-15 LAB — VITAMIN D 25 HYDROXY (VIT D DEFICIENCY, FRACTURES): VITD: 23.75 ng/mL — ABNORMAL LOW (ref 30.00–100.00)

## 2021-01-15 LAB — VITAMIN B12: Vitamin B-12: 262 pg/mL (ref 211–911)

## 2021-01-15 LAB — FERRITIN: Ferritin: 3.8 ng/mL — ABNORMAL LOW (ref 10.0–291.0)

## 2021-01-15 LAB — FOLATE: Folate: 12.3 ng/mL (ref >5.9)

## 2021-01-15 MED ORDER — FERROUS SULFATE 325 (65 FE) MG PO TBEC: 325.0000 mg | DELAYED_RELEASE_TABLET | Freq: Every day | ORAL | 0 refills | Status: DC

## 2021-01-15 MED ORDER — VITAMIN D (ERGOCALCIFEROL) 1.25 MG (50000 UNIT) PO CAPS: 50000.0000 [IU] | ORAL_CAPSULE | ORAL | 0 refills | Status: DC

## 2021-01-15 NOTE — Progress Notes (Signed)
Ariana Mcdaniel,  Your iron levels and vit D were low.  Iron deficiency in menstruating females is not uncommon, but your Prilosec may be contributing.  I recommend you take some oral iron supplements (ferrous sulfate 325 mg PO daily) for 4 months and then repeat iron levels in 4 months.   For your vitamin D, I recommend you take ergocalciferol 50,000 units weekly for 4 months and then repeat levels.   Your B12 and folate levels were within normal levels  [name],  Can you please order these medications (ferrous sulfate 325mg  PO daily #120 rf0, ergocalciferol 50,000IU weekly #16 rf0)

## 2021-01-15 NOTE — Patient Instructions (Addendum)
If you are age 43 or older, your body mass index should be between 23-30. Your Body mass index is 44.08 kg/m. If this is out of the aforementioned range listed, please consider follow up with your Primary Care Provider.  If you are age 15 or younger, your body mass index should be between 19-25. Your Body mass index is 44.08 kg/m. If this is out of the aformentioned range listed, please consider follow up with your Primary Care Provider.   Your provider has requested that you go to the basement level for lab work before leaving today. Press "B" on the elevator. The lab is located at the first door on the left as you exit the elevator.  Wean off of Prilosec-take every other day for 2 weeks. You can use Pepcid as needed daily.  The [facility] providers would like to encourage you to use [facility] to communicate with providers for non-urgent requests or questions.  Due to long hold times on the telephone, sending your provider a message by [facility] may be a faster and more efficient way to get a response.  Please allow 48 business hours for a response.  Please remember that this is for non-urgent requests.   Due to recent changes in healthcare laws, you may see the results of your imaging and laboratory studies on MyChart before your provider has had a chance to review them.  We understand that in some cases there may be results that are confusing or concerning to you. Not all laboratory results come back in the same time frame and the provider may be waiting for multiple results in order to interpret others.  Please give [place] 48 hours in order for your provider to thoroughly review all the results before contacting the office for clarification of your results.   It was a pleasure to see you today!  Thank you for trusting me with your gastrointestinal care!    Scott E.Candis Schatz, MD

## 2021-01-15 NOTE — Progress Notes (Signed)
HPI : Ariana Mcdaniel is a very pleasant 45 year old female with a history of GERD, obesity and depression who is referred to [place] by Dr. Docia Chuck for further evaluation of chronic abdominal pain and diarrhea.  The patient states that she started having these symptoms 3 or 4 years ago.  She changed her diet to a low carb/high protein diet for weight loss, and this seemed to resolve her symptoms.  She then developed problems with constipation for a while.  Her Ozempic dose was increased, and the diarrhea returned.  She subsequently stopped the Ozempic about 6 months ago, but the diarrhea and abdominal pain continued.  She has since returned to a standard diet.  About 2 months ago, she started taking an OTC probiotic (doesn't remember the name) and Gas-X on a daily basis.  This regimen seems to have worked well for her, as her diarrhea and bloating have resolved.  If she does not take these meds, her symptoms will return.  On this regimen she typically has one bowel movement per day, and denies problems with excessive gas/flatulence, bloating or abdominal discomfort.   When her symptoms are bad, she will have frequent urges to defecate with urgency and tenesmus, sometimes up to every 15 minutes.  Stools are often small volume and watery no blood or mucus.  No unintentional weight loss.  She has chronic heartburn which is well controlled with once daily Prilosec.  She rarely takes Pepcid for breakthrough symptoms.  She was recommended to take daily PPI 3 years ago when she took anticoagulation for a DVT and she has not stopped since.  She has tried stopping it, but her symptoms return.   She has a family history of celiac disease, but recent celiac serologies for the patient were negative.  She does not endorse a history of consistent gluten intolerance.     Past Medical History:  Diagnosis Date   Allergic rhinitis due to pollen    DVT (deep venous thrombosis) ([place]) 01/2017   Fatty liver    GERD  (gastroesophageal reflux disease)    Mastodynia    Palpitations    PONV (postoperative nausea and vomiting)    Renal stones      Past Surgical History:  Procedure Laterality Date   BREAST BIOPSY Bilateral    BREAST EXCISIONAL BIOPSY Left 04/2020   BREAST LUMPECTOMY WITH RADIOACTIVE SEED LOCALIZATION Left 04/25/2020   Procedure: LEFT BREAST LUMPECTOMY WITH RADIOACTIVE SEED LOCALIZATION;  Surgeon: Erroll Luna, MD;  Location: [facility];  Service: General;  Laterality: Left;   TONSILLECTOMY     WISDOM TOOTH EXTRACTION     Family History  Problem Relation Age of Onset   Thyroid disease Mother    High blood pressure Father    High Cholesterol Father    Celiac disease Sister    Celiac disease Sister    Hypertension Maternal Grandmother    Diabetes Maternal Grandmother    Stroke Maternal Grandmother    Depression Paternal Grandmother    Cancer Paternal Grandfather        non-hodgkin's lymphoma   Heart disease Neg Hx    Colon cancer Neg Hx    Esophageal cancer Neg Hx    Pancreatic cancer Neg Hx    Stomach cancer Neg Hx    Social History   Tobacco Use   Smoking status: Former    Types: Cigarettes    Quit date: 01/04/2010    Years since quitting: 11.0   Smokeless  tobacco: Never  Vaping Use   Vaping Use: Never used  Substance Use Topics   Alcohol use: Not Currently    Comment: 3-4 beers per week   Drug use: No   Current Outpatient Medications  Medication Sig Dispense Refill   cetirizine (ZYRTEC) 5 MG tablet Take 5 mg by mouth daily.     famotidine (PEPCID) 10 MG tablet Take 10 mg by mouth 2 (two) times daily.     omeprazole (PRILOSEC OTC) 20 MG tablet Take 20 mg by mouth daily.     Probiotic Product (PROBIOTIC DAILY PO) Take 1 tablet by mouth daily.     Simethicone (GAS-X PO) Take 1 tablet by mouth daily at 2 PM.     Vitamin D, Ergocalciferol, (DRISDOL) 1.25 MG (50000 UNIT) CAPS capsule Take 1 capsule (50,000 Units total) by mouth every 7 (seven)  days. 5 capsule 0   No current facility-administered medications for this visit.   Allergies  Allergen Reactions   Wellbutrin [Bupropion] Hives   Amoxicillin     itching   Clindamycin/Lincomycin     Caused a superinfection   Oxycodone-Acetaminophen Hives     Review of Systems: All systems reviewed and negative except where noted in HPI.    MM 3D SCREEN BREAST BILATERAL  Result Date: 12/27/2020 CLINICAL DATA:  Screening. EXAM: DIGITAL SCREENING BILATERAL MAMMOGRAM WITH TOMOSYNTHESIS AND CAD TECHNIQUE: Bilateral screening digital craniocaudal and mediolateral oblique mammograms were obtained. Bilateral screening digital breast tomosynthesis was performed. The images were evaluated with computer-aided detection. COMPARISON:  Previous exam(s). ACR Breast Density Category b: There are scattered areas of fibroglandular density. FINDINGS: There are no findings suspicious for malignancy. IMPRESSION: No mammographic evidence of malignancy. A result letter of this screening mammogram will be mailed directly to the patient. RECOMMENDATION: Screening mammogram in one year. (Code:SM-B-01Y) BI-RADS CATEGORY  1: Negative. Electronically Signed   By: Lajean Manes M.D.   On: 12/27/2020 14:22    Physical Exam: BP 122/84   Pulse 74   Ht 5\' 2"  (1.575 m)   Wt 241 lb (109.3 kg)   SpO2 98%   BMI 44.08 kg/m  Constitutional: Pleasant,well-developed, obese Caucasian female in no acute distress. HEENT: Normocephalic and atraumatic. Conjunctivae are normal. No scleral icterus. Cardiovascular: Normal rate, regular rhythm.  Pulmonary/chest: Effort normal and breath sounds normal. No wheezing, rales or rhonchi. Abdominal: Soft, nondistended, nontender. Bowel sounds active throughout. There are no masses palpable. No hepatomegaly. Extremities: no edema Neurological: Alert and oriented to person place and time. Skin: Skin is warm and dry. No rashes noted. Psychiatric: Normal mood and affect. Behavior is  normal.  CBC    Component Value Date/Time   WBC 6.3 04/24/2020 1229   RBC 4.60 04/24/2020 1229   HGB 11.7 (L) 04/24/2020 1229   HGB 12.2 10/14/2018 1458   HCT 36.7 04/24/2020 1229   HCT 37.9 10/14/2018 1458   PLT 347 04/24/2020 1229   PLT 357 10/14/2018 1458   MCV 79.8 (L) 04/24/2020 1229   MCV 79 10/14/2018 1458   MCH 25.4 (L) 04/24/2020 1229   MCHC 31.9 04/24/2020 1229   RDW 14.7 04/24/2020 1229   RDW 13.2 10/14/2018 1458   LYMPHSABS 2.4 04/24/2020 1229   MONOABS 0.5 04/24/2020 1229   EOSABS 0.1 04/24/2020 1229   BASOSABS 0.1 04/24/2020 1229    CMP     Component Value Date/Time   NA 139 11/01/2020 0743   K 4.9 11/01/2020 0743   CL 104 11/01/2020 0743   CO2 24  11/01/2020 0743   GLUCOSE 95 11/01/2020 0743   GLUCOSE 80 04/24/2020 1229   BUN 10 11/01/2020 0743   CREATININE 0.71 11/01/2020 0743   CREATININE 0.73 03/28/2015 1738   CALCIUM 8.7 11/01/2020 0743   PROT 6.3 11/01/2020 0743   ALBUMIN 4.0 11/01/2020 0743   AST 14 11/01/2020 0743   ALT 18 11/01/2020 0743   ALKPHOS 81 11/01/2020 0743   BILITOT 0.2 11/01/2020 0743   GFRNONAA >60 04/24/2020 1229   GFRAA 125 12/12/2019 0814   Component Ref Range & Units 1 mo ago   Gliadin IgG U/mL <1.0   Comment: .  Marland Kitchen  Units Value      Interpretation  -----------      --------------  <15.0            Antibody not detected  > or = 15.0      Antibody detected   Gliadin IgA U/mL 4.3   Comment: .  Marland Kitchen  Units Value      Interpretation  -----------      --------------  <15.0            Antibody not detected  > or = 15.0      Antibody detected   (tTG) Ab, IgG U/mL <1.0   Comment: .  Marland Kitchen  Units Value      Interpretation  -----------      --------------  <15.0            Antibody not detected  > or = 15.0      Antibody detected   (tTG) Ab, IgA U/mL <1.0   Comment: .  Marland Kitchen  Units Value      Interpretation  -----------      --------------  <15.0            Antibody not detected  > or = 15.0      Antibody detected    Endomysial Ab IgA NEGATIVE NEGATIVE   Endomysial Titer  CANCELED   Comment: .  Test Not Performed. Screening test Negative or Not Detected.  Titer not performed.   Result canceled by the ancillary.     ASSESSMENT AND PLAN: 45 year old female with a history of chronic diarrhea, constipation and bothersome gas/bloating.  For the past 2 months, she says her symptoms have been well controlled with daily probiotics and simethicone.  Her symptoms seem most consistent with IBS-M.  Recent celiac serologies were negative.  We discussed the proposed pathophysiology of IBS and gut brain axis disorders in general.  We discussed management of IBS, to include use of medications to improve bowel habits, as needed pain medicine, centrally acting neuromodulators, role of empiric dietary modifications to include a low FODMAP diet gluten-free diet, as well as the role of cognitive therapies.  We discussed the goals of IBS management, namely to minimize the impact of GI symptoms on quality of life.  I emphasized that it would be unreasonable for the patient to expect completely normal bowel habits and no abdominal discomfort.  The patient seems to have a regimen that works well for her.  I recommended she continue taking the probiotic and simethicone as she is doing.   For her GERD, I recommended she try to wean off Prilosec and see if Pepcid can control her symptoms alone.  Suggested that she start taking the Prilosec every other day for 2 weeks then stop it completely, using Pepcid as needed, then she can use the Pepcid either as needed or scheduled,  depending on how frequent her symptoms are.   IBS-M - Continue Probiotics and Gas-X, working well for her - Follow up as needed if symptoms worsen  GERD - Wean off Prilosec, start Pepcid - Will check B12/folate/iron/Vit D given chronic PPI use  Colon cancer screening - Screening colonoscopy at age 82  Jehieli Brassell E. Candis Schatz, MD [facility]    CC:  Venia Carbon, MD

## 2021-06-21 ENCOUNTER — Encounter: Payer: Self-pay | Admitting: Family

## 2021-06-21 ENCOUNTER — Ambulatory Visit: Payer: BC Managed Care – PPO | Admitting: Family

## 2021-06-21 VITALS — BP 132/68 | HR 80 | Temp 98.6°F | Resp 16 | Ht 62.0 in | Wt 246.2 lb

## 2021-06-21 DIAGNOSIS — J301 Allergic rhinitis due to pollen: Secondary | ICD-10-CM

## 2021-06-21 DIAGNOSIS — J0110 Acute frontal sinusitis, unspecified: Secondary | ICD-10-CM | POA: Insufficient documentation

## 2021-06-21 MED ORDER — DOXYCYCLINE HYCLATE 100 MG PO TABS: 100.0000 mg | ORAL_TABLET | Freq: Two times a day (BID) | ORAL | 0 refills | Status: AC

## 2021-06-21 NOTE — Assessment & Plan Note (Signed)
Recommend that pt switch from zyrtec to xyzal daily ?

## 2021-06-21 NOTE — Assessment & Plan Note (Signed)
Prescription given for doxycycline 100 mg po bid for ten days. Pt to continue tylenol/ibuprofen prn sinus pain. Continue with humidifier prn and steam showers recommended as well. instructed If no symptom improvement in 48 hours please f/u ? ?

## 2021-06-21 NOTE — Progress Notes (Signed)
? ?Established Patient Office Visit ? ?Subjective:  ?Patient ID: Ariana Mcdaniel, female    DOB: 05-Aug-1975  Age: 46 y.o. MRN: [id number] ? ?CC:  ?Chief Complaint  ?Patient presents with  ? Dental Problem  ? Sinus Problem  ?  X 4 days now pt stated no pressure but can feel it draining.  ? ? ?HPI ?Ariana Mcdaniel is here today with concerns.  ? ?For the last week on and off, [place], and has since notied sinus pressure, sinus pain, some ear pain on right ear, nasal congestion, post nasal drip and some sneezing.  ?No chest congestion no cough.  ? ?No sore throat. Some chills, maybe fever yesterday am but wasn't able to check.  ? ?Past Medical History:  ?Diagnosis Date  ? Allergic rhinitis due to pollen   ? DVT (deep venous thrombosis) ([place]) 01/2017  ? Fatty liver   ? GERD (gastroesophageal reflux disease)   ? Mastodynia   ? Palpitations   ? PONV (postoperative nausea and vomiting)   ? Renal stones   ? ? ?Past Surgical History:  ?Procedure Laterality Date  ? BREAST BIOPSY Bilateral   ? BREAST EXCISIONAL BIOPSY Left 04/2020  ? BREAST LUMPECTOMY WITH RADIOACTIVE SEED LOCALIZATION Left 04/25/2020  ? Procedure: LEFT BREAST LUMPECTOMY WITH RADIOACTIVE SEED LOCALIZATION;  Surgeon: Erroll Luna, MD;  Location: [facility];  Service: General;  Laterality: Left;  ? TONSILLECTOMY    ? WISDOM TOOTH EXTRACTION    ? ? ?Family History  ?Problem Relation Age of Onset  ? Thyroid disease Mother   ? High blood pressure Father   ? High Cholesterol Father   ? Celiac disease Sister   ? Celiac disease Sister   ? Hypertension Maternal Grandmother   ? Diabetes Maternal Grandmother   ? Stroke Maternal Grandmother   ? Depression Paternal Grandmother   ? Cancer Paternal Grandfather   ?     non-hodgkin's lymphoma  ? Heart disease Neg Hx   ? Colon cancer Neg Hx   ? Esophageal cancer Neg Hx   ? Pancreatic cancer Neg Hx   ? Stomach cancer Neg Hx   ? ? ?Social History  ? ?Socioeconomic History  ? Marital status:  Married  ?  Spouse name: Mitzi Hansen  ? Number of children: 2  ? Years of education: Not on file  ? Highest education level: Not on file  ?Occupational History  ? Occupation: Health visitor  ?  Employer: [employer]  ?Tobacco Use  ? Smoking status: Former  ?  Types: Cigarettes  ?  Quit date: 01/04/2010  ?  Years since quitting: 11.4  ? Smokeless tobacco: Never  ?Vaping Use  ? Vaping Use: Never used  ?Substance and Sexual Activity  ? Alcohol use: Not Currently  ?  Comment: 3-4 beers per week  ? Drug use: No  ? Sexual activity: Yes  ?  Partners: Male  ?  Birth control/protection: None  ?  Comment: husband had Vasectomy  ?Other Topics Concern  ? Not on file  ?Social History Narrative  ? 2nd marriage  ? ?Social Determinants of Health  ? ?Financial Resource Strain: Not on file  ?Food Insecurity: Not on file  ?Transportation Needs: Not on file  ?Physical Activity: Not on file  ?Stress: Not on file  ?Social Connections: Not on file  ?Intimate Partner Violence: Not on file  ? ? ?Outpatient Medications Prior to Visit  ?Medication Sig Dispense Refill  ?  cetirizine (ZYRTEC) 5 MG tablet Take 5 mg by mouth daily.    ? famotidine (PEPCID) 10 MG tablet Take 10 mg by mouth 2 (two) times daily.    ? ferrous sulfate 325 (65 FE) MG EC tablet Take 1 tablet (325 mg total) by mouth daily with breakfast. 120 tablet 0  ? omeprazole (PRILOSEC OTC) 20 MG tablet Take 20 mg by mouth daily.    ? Probiotic Product (PROBIOTIC DAILY PO) Take 1 tablet by mouth daily.    ? Simethicone (GAS-X PO) Take 1 tablet by mouth daily at 2 PM.    ? Vitamin D, Ergocalciferol, (DRISDOL) 1.25 MG (50000 UNIT) CAPS capsule Take 1 capsule (50,000 Units total) by mouth every 7 (seven) days. 16 capsule 0  ? Vitamin D, Ergocalciferol, (DRISDOL) 1.25 MG (50000 UNIT) CAPS capsule Take 1 capsule (50,000 Units total) by mouth every 7 (seven) days. 5 capsule 0  ? ?No facility-administered medications prior to visit.  ? ? ?Allergies  ?Allergen Reactions  ? Wellbutrin  [Bupropion] Hives  ? Amoxicillin   ?  itching  ? Clindamycin/Lincomycin   ?  Caused a superinfection  ? Oxycodone-Acetaminophen Hives  ? ? ?ROS ?Review of Systems  ?Constitutional:  Negative for chills and fever.  ?HENT:  Positive for congestion, ear pain (right ear pain), sinus pressure and sinus pain. Negative for sore throat.   ?Respiratory:  Negative for cough, shortness of breath and wheezing.   ?Cardiovascular:  Negative for chest pain and palpitations.  ? ?  ?Objective:  ?  ?Physical Exam ?Constitutional:   ?   General: She is not in acute distress. ?   Appearance: She is obese. She is not ill-appearing, toxic-appearing or diaphoretic.  ?HENT:  ?   Head: Normocephalic.  ?   Right Ear: Tympanic membrane normal. No middle ear effusion. There is no impacted cerumen. Tympanic membrane is not erythematous.  ?   Left Ear: A middle ear effusion (clear) is present. There is no impacted cerumen. Tympanic membrane is not erythematous.  ?   Nose:  ?   Right Sinus: Frontal sinus tenderness present.  ?   Left Sinus: Frontal sinus tenderness present.  ?   Mouth/Throat:  ?   Mouth: Mucous membranes are moist.  ?Eyes:  ?   Pupils: Pupils are equal, round, and reactive to light.  ?Cardiovascular:  ?   Rate and Rhythm: Normal rate and regular rhythm.  ?Pulmonary:  ?   Effort: Pulmonary effort is normal.  ?Neurological:  ?   Mental Status: She is alert.  ? ? ?BP 132/68   Pulse 80   Temp 98.6 ?F (37 ?C)   Resp 16   Ht '5\' 2"'  (1.575 m)   Wt 246 lb 3 oz (111.7 kg)   LMP 06/18/2021 (Exact Date)   SpO2 98%   BMI 45.03 kg/m?  ?Wt Readings from Last 3 Encounters:  ?06/21/21 246 lb 3 oz (111.7 kg)  ?01/15/21 241 lb (109.3 kg)  ?12/10/20 239 lb 3.2 oz (108.5 kg)  ? ? ? ?Health Maintenance Due  ?Topic Date Due  ? HIV Screening  Never done  ? Hepatitis C Screening  Never done  ? TETANUS/TDAP  Never done  ? ? ?There are no preventive care reminders to display for this patient. ? ?Lab Results  ?Component Value Date  ? TSH 1.740  10/14/2018  ? ?Lab Results  ?Component Value Date  ? WBC 6.3 04/24/2020  ? HGB 11.7 (L) 04/24/2020  ? HCT 36.7 04/24/2020  ?  MCV 79.8 (L) 04/24/2020  ? PLT 347 04/24/2020  ? ?Lab Results  ?Component Value Date  ? NA 139 11/01/2020  ? K 4.9 11/01/2020  ? CO2 24 11/01/2020  ? GLUCOSE 95 11/01/2020  ? BUN 10 11/01/2020  ? CREATININE 0.71 11/01/2020  ? BILITOT 0.2 11/01/2020  ? ALKPHOS 81 11/01/2020  ? AST 14 11/01/2020  ? ALT 18 11/01/2020  ? PROT 6.3 11/01/2020  ? ALBUMIN 4.0 11/01/2020  ? CALCIUM 8.7 11/01/2020  ? ANIONGAP 9 04/24/2020  ? EGFR 109 11/01/2020  ? GFR 95.42 12/07/2018  ? ?Lab Results  ?Component Value Date  ? HGBA1C 5.8 (H) 11/01/2020  ? ? ?  ?Assessment & Plan:  ? ?Problem List Items Addressed This Visit   ? ?  ? Respiratory  ? Allergic rhinitis due to pollen  ?  Recommend that pt switch from zyrtec to xyzal daily ?  ?  ? Acute non-recurrent frontal sinusitis - Primary  ?  Prescription given for doxycycline 100 mg po bid for ten days. Pt to continue tylenol/ibuprofen prn sinus pain. Continue with humidifier prn and steam showers recommended as well. instructed If no symptom improvement in 48 hours please f/u ? ?  ?  ? Relevant Medications  ? doxycycline (VIBRA-TABS) 100 MG tablet  ? ? ?Meds ordered this encounter  ?Medications  ? doxycycline (VIBRA-TABS) 100 MG tablet  ?  Sig: Take 1 tablet (100 mg total) by mouth 2 (two) times daily for 10 days.  ?  Dispense:  20 tablet  ?  Refill:  0  ?  Order Specific Question:   Supervising Provider  ?  Answer:   BEDSOLE, AMY E [[id number]]  ? ? ?Follow-up: Return if symptoms worsen or fail to improve with pcp.  ? ? ?Eugenia Pancoast, FNP ?

## 2021-06-21 NOTE — Patient Instructions (Signed)
Start prescription of doxycycline 100 mg and take as prescribed.  ?Tylenol/ibuprofen ok for sinus pain as needed ?Increase oral fluids. Ok to continue with humidifers and hot steamy showers as discussed during visit. ? ?It was a pleasure speaking with you today, I hope you start feeling better soon. ? ?Regards,  ? ?[facility] ? ?

## 2021-08-21 ENCOUNTER — Telehealth: Payer: Self-pay

## 2021-08-21 NOTE — Telephone Encounter (Signed)
[facility] - Client TELEPHONE ADVICE RECORD [facility] Patient Name: Ariana Mcdaniel YN Gender: Female DOB: [birth year] Age: 46 Y 36 M 22 D Return Phone Number: [phone] (Primary), [phone] (Secondary) Address: City/ State/ Zip: [place]  [address] Client [facility] - Client Client [facility] Day Provider Viviana Simpler- MD Contact Type Call Who Is Calling Patient / Member / Family / Caregiver Call Type Triage / Clinical Relationship To Patient Self Return Phone Number [phone] (Primary) Chief Complaint Heart palpitations or irregular heartbeat Reason for Call Symptomatic / Request for Numa was transferred from the office, has an appt tomorrow, random heart palpation, swellings in the feet, breathing is fine. Translation No Nurse Assessment Nurse: Rolin Barry, RN, Levada Dy Date/Time (Eastern Time): 08/21/2021 11:05:08 AM Confirm and document reason for call. If symptomatic, describe symptoms. ---Caller was transferred from the office, has an appt tomorrow, random heart palpation, swellings in the feet, breathing is fine. Swelling has been for weeks, currently having palpitations. Current HR is 80. Does the patient have any new or worsening symptoms? ---Yes Will a triage be completed? ---Yes Related visit to physician within the last 2 weeks? ---No Does the PT have any chronic conditions? (i.e. diabetes, asthma, this includes High risk factors for pregnancy, etc.) ---Yes List chronic conditions. ---gas pill, probiotic, heartburn meds. Is the patient pregnant or possibly pregnant? (Ask all females between the ages of 98-55) ---No Is this a behavioral health or substance abuse call? ---No Guidelines Guideline Title Affirmed Question Affirmed Notes Nurse Date/Time (Eastern Time) Heart Rate and Heartbeat Questions New or worsened ankle  swelling Deaton, RN, Levada Dy 08/21/2021 11:06:58 AM Disp. Time Eilene Ghazi Time) Disposition Final User PLEASE NOTE: All timestamps contained within this report are represented as [place] Standard Time. CONFIDENTIALTY NOTICE: This fax transmission is intended only for the addressee. It contains information that is legally privileged, confidential or otherwise protected from use or disclosure. If you are not the intended recipient, you are strictly prohibited from reviewing, disclosing, copying using or disseminating any of this information or taking any action in reliance on or regarding this information. If you have received this fax in error, please notify us immediately by telephone so that we can arrange for its return to [place]. Phone: [phone], Toll-Free: [phone], Fax: [phone] Page: 2 of 2 Call Id: [id number] 08/21/2021 11:08:52 AM See HCP within 4 Hours (or PCP triage) Yes Deaton, RN, Cindee Lame Disagree/Comply Comply Caller Understands Yes PreDisposition Did not know what to do Care Advice Given Per Guideline SEE HCP (OR PCP TRIAGE) WITHIN 4 HOURS: * IF OFFICE WILL BE OPEN: You need to be seen within the next 3 or 4 hours. Call your doctor (or NP/PA) now or as soon as the office opens. CARE ADVICE given per Heart Rate and Heartbeat Questions (Adult) guideline. * You become worse CALL BACK IF: * UCC: Some UCCs can manage patients who are stable and have less serious symptoms (e.g., minor illnesses and injuries). The triager must know the [facility] capabilities before sending a patient there. If unsure, call ahead. Comments User: Saverio Danker, RN Date/Time Eilene Ghazi Time): 08/21/2021 11:17:05 AM Caller has an appt at 2:45 tomorrow with her MD. Hulen Skains the backline with the need for upgrade. Spoke with Joellen at the [facility]. No appts available today.Caller was advised to keep her appt tomorrow, however, recommended to go to UC today within 4 hours to be seen for her sx. Caller  was  undecided at this time what she will do. Referrals Warm transfer to backlin

## 2021-08-22 ENCOUNTER — Ambulatory Visit: Payer: BC Managed Care – PPO | Admitting: Internal Medicine

## 2021-08-22 ENCOUNTER — Encounter: Payer: Self-pay | Admitting: Internal Medicine

## 2021-08-22 VITALS — BP 120/72 | HR 92 | Temp 97.7°F | Ht 62.0 in | Wt 250.5 lb

## 2021-08-22 DIAGNOSIS — R002 Palpitations: Secondary | ICD-10-CM

## 2021-08-22 NOTE — Progress Notes (Signed)
Subjective:    Patient ID: Ariana Mcdaniel, female    DOB: [birth year], 46 y.o.   MRN: [id number]  HPI Here due to palpitations and foot swelling  Has noticed some palpitations since 5 days ago Has had some years ago--but different (PACs found and briefly took low dose lopressor) Now feels more like "flutters"-----not skips like 4 years ago No fast heart  Some edema for 2-3 weeks No diet changes Doesn't add salt No chest pain or SOB Sleeps flat--no PND Notices the swelling by lunch---usually gone by the morning  Current Outpatient Medications on File Prior to Visit  Medication Sig Dispense Refill   cetirizine (ZYRTEC) 5 MG tablet Take 5 mg by mouth daily.     Cholecalciferol (VITAMIN D3 PO) Take by mouth daily.     famotidine (PEPCID) 10 MG tablet Take 10 mg by mouth 2 (two) times daily.     ferrous sulfate 325 (65 FE) MG EC tablet Take 1 tablet (325 mg total) by mouth daily with breakfast. 120 tablet 0   omeprazole (PRILOSEC OTC) 20 MG tablet Take 20 mg by mouth daily.     Probiotic Product (PROBIOTIC DAILY PO) Take 1 tablet by mouth daily.     Simethicone (GAS-X PO) Take 1 tablet by mouth daily at 2 PM.     No current facility-administered medications on file prior to visit.    Allergies  Allergen Reactions   Wellbutrin [Bupropion] Hives   Amoxicillin     itching   Clindamycin/Lincomycin     Caused a superinfection   Oxycodone-Acetaminophen Hives    Past Medical History:  Diagnosis Date   Allergic rhinitis due to pollen    DVT (deep venous thrombosis) ([place]) 01/2017   Fatty liver    GERD (gastroesophageal reflux disease)    Mastodynia    Palpitations    PONV (postoperative nausea and vomiting)    Renal stones     Past Surgical History:  Procedure Laterality Date   BREAST BIOPSY Bilateral    BREAST EXCISIONAL BIOPSY Left 04/2020   BREAST LUMPECTOMY WITH RADIOACTIVE SEED LOCALIZATION Left 04/25/2020   Procedure: LEFT BREAST LUMPECTOMY WITH RADIOACTIVE SEED  LOCALIZATION;  Surgeon: Erroll Luna, MD;  Location: [facility];  Service: General;  Laterality: Left;   TONSILLECTOMY     WISDOM TOOTH EXTRACTION      Family History  Problem Relation Age of Onset   Thyroid disease Mother    High blood pressure Father    High Cholesterol Father    Celiac disease Sister    Celiac disease Sister    Hypertension Maternal Grandmother    Diabetes Maternal Grandmother    Stroke Maternal Grandmother    Depression Paternal Grandmother    Cancer Paternal Grandfather        non-hodgkin's lymphoma   Heart disease Neg Hx    Colon cancer Neg Hx    Esophageal cancer Neg Hx    Pancreatic cancer Neg Hx    Stomach cancer Neg Hx     Social History   Socioeconomic History   Marital status: Married    Spouse name: Mitzi Hansen   Number of children: 2   Years of education: Not on file   Highest education level: Not on file  Occupational History   Occupation: Courtroom clerk    Employer: [employer]  Tobacco Use   Smoking status: Former    Types: Cigarettes    Quit date: 01/04/2010    Years since quitting: 3.6  Smokeless tobacco: Never  Vaping Use   Vaping Use: Never used  Substance and Sexual Activity   Alcohol use: Not Currently    Comment: 3-4 beers per week   Drug use: No   Sexual activity: Yes    Partners: Male    Birth control/protection: None    Comment: husband had Vasectomy  Other Topics Concern   Not on file  Social History Narrative   2nd marriage   Social Determinants of Health   Financial Resource Strain: Not on file  Food Insecurity: Not on file  Transportation Needs: Not on file  Physical Activity: Not on file  Stress: Not on file  Social Connections: Not on file  Intimate Partner Violence: Not on file   Review of Systems Appetite is okay Weight is stable No caffeine    Objective:   Physical Exam Constitutional:      Appearance: Normal appearance.  Cardiovascular:     Rate and Rhythm: Normal  rate and regular rhythm.     Pulses: Normal pulses.     Heart sounds: No murmur heard.    No gallop.  Pulmonary:     Effort: Pulmonary effort is normal.     Breath sounds: Normal breath sounds. No wheezing or rales.  Abdominal:     Palpations: Abdomen is soft.     Tenderness: There is no abdominal tenderness.  Musculoskeletal:     Cervical back: Neck supple.     Comments: Thick calves without pitting and no sig edema (she feels it is better today)  Lymphadenopathy:     Cervical: No cervical adenopathy.  Neurological:     Mental Status: She is alert.            Assessment & Plan:

## 2021-08-22 NOTE — Assessment & Plan Note (Signed)
Recurrent now--had 4 years ago and had PACs Feels different now but none for a few days No caffeine Some edema--but not today. Nothing to suggest CHF No reason to consider CAD  Discussed options Will check labs including CBC, met C, thyroid If persists, will plan event monitor, echo, cardiology evaluation

## 2021-08-23 LAB — COMPREHENSIVE METABOLIC PANEL
ALT: 35 U/L (ref 0–35)
AST: 18 U/L (ref 0–37)
Albumin: 4 g/dL (ref 3.5–5.2)
Alkaline Phosphatase: 73 U/L (ref 39–117)
BUN: 14 mg/dL (ref 6–23)
CO2: 27 mEq/L (ref 19–32)
Calcium: 9 mg/dL (ref 8.4–10.5)
Chloride: 103 mEq/L (ref 96–112)
Creatinine, Ser: 0.8 mg/dL (ref 0.40–1.20)
GFR: 90.15 mL/min (ref >60.00)
Glucose, Bld: 100 mg/dL — ABNORMAL HIGH (ref 70–99)
Potassium: 3.9 mEq/L (ref 3.5–5.1)
Sodium: 137 mEq/L (ref 135–145)
Total Bilirubin: 0.3 mg/dL (ref 0.2–1.2)
Total Protein: 6.7 g/dL (ref 6.0–8.3)

## 2021-08-23 LAB — CBC
HCT: 37.8 % (ref 36.0–46.0)
Hemoglobin: 12.2 g/dL (ref 12.0–15.0)
MCHC: 32.3 g/dL (ref 30.0–36.0)
MCV: 80.7 fl (ref 78.0–100.0)
Platelets: 302 10*3/uL (ref 150.0–400.0)
RBC: 4.68 Mil/uL (ref 3.87–5.11)
RDW: 14.5 % (ref 11.5–15.5)
WBC: 8.3 10*3/uL (ref 4.0–10.5)

## 2021-08-23 LAB — T4, FREE: Free T4: 0.98 ng/dL (ref 0.60–1.60)

## 2021-10-16 ENCOUNTER — Encounter (INDEPENDENT_AMBULATORY_CARE_PROVIDER_SITE_OTHER): Payer: Self-pay

## 2021-12-11 ENCOUNTER — Other Ambulatory Visit: Payer: Self-pay | Admitting: Internal Medicine

## 2021-12-11 DIAGNOSIS — Z1231 Encounter for screening mammogram for malignant neoplasm of breast: Secondary | ICD-10-CM

## 2022-01-10 ENCOUNTER — Ambulatory Visit
Admission: RE | Admit: 2022-01-10 | Discharge: 2022-01-10 | Disposition: A | Discharge status: Home / Self Care | Payer: BC Managed Care – PPO | Source: Ambulatory Visit | Attending: Internal Medicine | Admitting: Internal Medicine

## 2022-01-10 DIAGNOSIS — Z1231 Encounter for screening mammogram for malignant neoplasm of breast: Secondary | ICD-10-CM

## 2022-01-28 ENCOUNTER — Ambulatory Visit (INDEPENDENT_AMBULATORY_CARE_PROVIDER_SITE_OTHER): Payer: BC Managed Care – PPO | Admitting: Internal Medicine

## 2022-01-28 ENCOUNTER — Encounter: Payer: Self-pay | Admitting: Internal Medicine

## 2022-01-28 VITALS — BP 104/80 | HR 92 | Temp 97.6°F | Ht 62.0 in | Wt 250.0 lb

## 2022-01-28 DIAGNOSIS — R1030 Lower abdominal pain, unspecified: Secondary | ICD-10-CM | POA: Insufficient documentation

## 2022-01-28 LAB — POC URINALSYSI DIPSTICK (AUTOMATED)
Glucose, UA: NEGATIVE
Ketones, UA: NEGATIVE
Nitrite, UA: NEGATIVE
Protein, UA: NEGATIVE
Spec Grav, UA: >=1.03 — AB (ref 1.010–1.025)
Urobilinogen, UA: 0.2 E.U./dL
pH, UA: 6 (ref 5.0–8.0)

## 2022-01-28 MED ORDER — SULFAMETHOXAZOLE-TRIMETHOPRIM 400-80 MG PO TABS
1.0000 | ORAL_TABLET | Freq: Two times a day (BID) | ORAL | 1 refills | Status: DC
Start: 2022-01-28 — End: 2022-02-21

## 2022-01-28 NOTE — Assessment & Plan Note (Addendum)
Seems likely exacerbation of IBS---possible starting with ovulatory discomfort and ?related to the imodium. Pain comes on with voiding--though no overt dysuria----so will check urine Reassured---nothing to suggest diverticulitis or gallbladder disease  Urine does show 2+ leuks Will send culture and try 3 days of septra SS bid

## 2022-01-28 NOTE — Addendum Note (Signed)
Addended by: Pilar Grammes on: 01/28/2022 11:07 AM   Modules accepted: Orders

## 2022-01-28 NOTE — Progress Notes (Signed)
Subjective:    Patient ID: Ariana Mcdaniel, female    DOB: 09-Jan-1976, 46 y.o.   MRN: [id number]  HPI Here due to diarrhea again  Did go to GI Felt it was likely IBS Was doing okay but then ran out of her probiotic Had pain starting with ovulation--then had breakfast at [employer] (last week) Since then --lower abdominal pain bilaterally Loose stools every 15-20 minutes for a few days Then started imodium--took 2 yesterday (and hasn't gone since) No blood  Did restart the probiotic 4 days ago May be improving  Current Outpatient Medications on File Prior to Visit  Medication Sig Dispense Refill   cetirizine (ZYRTEC) 5 MG tablet Take 5 mg by mouth daily.     Cholecalciferol (VITAMIN D3 PO) Take by mouth daily.     famotidine (PEPCID) 10 MG tablet Take 10 mg by mouth 2 (two) times daily.     ferrous sulfate 325 (65 FE) MG EC tablet Take 1 tablet (325 mg total) by mouth daily with breakfast. 120 tablet 0   omeprazole (PRILOSEC OTC) 20 MG tablet Take 20 mg by mouth daily.     Probiotic Product (PROBIOTIC DAILY PO) Take 1 tablet by mouth daily.     Simethicone (GAS-X PO) Take 1 tablet by mouth daily at 2 PM.     No current facility-administered medications on file prior to visit.    Allergies  Allergen Reactions   Wellbutrin [Bupropion] Hives   Amoxicillin     itching   Clindamycin/Lincomycin     Caused a superinfection   Oxycodone-Acetaminophen Hives    Past Medical History:  Diagnosis Date   Allergic rhinitis due to pollen    DVT (deep venous thrombosis) ([place]) 01/2017   Fatty liver    GERD (gastroesophageal reflux disease)    Mastodynia    Palpitations    PONV (postoperative nausea and vomiting)    Renal stones     Past Surgical History:  Procedure Laterality Date   BREAST BIOPSY Bilateral    BREAST EXCISIONAL BIOPSY Left 04/2020   BREAST LUMPECTOMY WITH RADIOACTIVE SEED LOCALIZATION Left 04/25/2020   Procedure: LEFT BREAST LUMPECTOMY WITH RADIOACTIVE SEED  LOCALIZATION;  Surgeon: Erroll Luna, MD;  Location: [facility];  Service: General;  Laterality: Left;   TONSILLECTOMY     WISDOM TOOTH EXTRACTION      Family History  Problem Relation Age of Onset   Thyroid disease Mother    High blood pressure Father    High Cholesterol Father    Celiac disease Sister    Celiac disease Sister    Hypertension Maternal Grandmother    Diabetes Maternal Grandmother    Stroke Maternal Grandmother    Depression Paternal Grandmother    Cancer Paternal Grandfather        non-hodgkin's lymphoma   Heart disease Neg Hx    Colon cancer Neg Hx    Esophageal cancer Neg Hx    Pancreatic cancer Neg Hx    Stomach cancer Neg Hx     Social History   Socioeconomic History   Marital status: Married    Spouse name: Mitzi Hansen   Number of children: 2   Years of education: Not on file   Highest education level: Not on file  Occupational History   Occupation: Courtroom clerk    Employer: [employer]  Tobacco Use   Smoking status: Former    Types: Cigarettes    Quit date: 01/04/2010    Years since quitting: 12.0  Smokeless tobacco: Never  Vaping Use   Vaping Use: Never used  Substance and Sexual Activity   Alcohol use: Not Currently    Comment: 3-4 beers per week   Drug use: No   Sexual activity: Yes    Partners: Male    Birth control/protection: None    Comment: husband had Vasectomy  Other Topics Concern   Not on file  Social History Narrative   2nd marriage   Social Determinants of Health   Financial Resource Strain: Not on file  Food Insecurity: Not on file  Transportation Needs: Not on file  Physical Activity: Not on file  Stress: Not on file  Social Connections: Not on file  Intimate Partner Violence: Not on file   Review of Systems No recent stressors Doesn't sleep well--but nothing new Generally careful with diet No fever    Objective:   Physical Exam Constitutional:      Appearance: Normal appearance.   Pulmonary:     Effort: Pulmonary effort is normal.     Breath sounds: Normal breath sounds. No wheezing or rales.  Abdominal:     General: Bowel sounds are normal. There is no distension.     Palpations: Abdomen is soft.     Comments: Very slight lower abdominal tenderness---L>R. Slight in middle  Neurological:     Mental Status: She is alert.            Assessment & Plan:

## 2022-01-29 LAB — URINE CULTURE
MICRO NUMBER:: 14218944
SPECIMEN QUALITY:: ADEQUATE

## 2022-02-21 ENCOUNTER — Encounter: Payer: Self-pay | Admitting: Family Medicine

## 2022-02-21 ENCOUNTER — Ambulatory Visit: Payer: BC Managed Care – PPO | Admitting: Family Medicine

## 2022-02-21 VITALS — BP 136/74 | HR 90 | Temp 98.2°F | Ht 62.0 in | Wt 252.6 lb

## 2022-02-21 DIAGNOSIS — J029 Acute pharyngitis, unspecified: Secondary | ICD-10-CM

## 2022-02-21 DIAGNOSIS — H6992 Unspecified Eustachian tube disorder, left ear: Secondary | ICD-10-CM | POA: Insufficient documentation

## 2022-02-21 DIAGNOSIS — J4520 Mild intermittent asthma, uncomplicated: Secondary | ICD-10-CM | POA: Diagnosis not present

## 2022-02-21 DIAGNOSIS — J069 Acute upper respiratory infection, unspecified: Secondary | ICD-10-CM | POA: Diagnosis not present

## 2022-02-21 LAB — POCT RAPID STREP A (OFFICE): Rapid Strep A Screen: NEGATIVE

## 2022-02-21 MED ORDER — PREDNISONE 20 MG PO TABS: 20.0000 mg | ORAL_TABLET | Freq: Two times a day (BID) | ORAL | 0 refills | Status: AC

## 2022-02-21 NOTE — Progress Notes (Signed)
Established Patient Office Visit   Subjective:  Patient ID: Ariana Mcdaniel, female    DOB: 1975/05/23  Age: 46 y.o. MRN: [id number]  Chief Complaint  Patient presents with   Sore Throat    Sore throat x 5 days, headache x 2 weeks left ear pain started this morning.     Sore Throat  Associated symptoms include congestion, coughing, ear pain and stridor. Pertinent negatives include no abdominal pain, headaches, shortness of breath or vomiting.   Encounter Diagnoses  Name Primary?   Pharyngitis, unspecified etiology Yes   Upper respiratory tract infection, unspecified type    Dysfunction of left eustachian tube    Mild intermittent reactive airway disease without complication    5-day history of sore throat that is worse today.  Complains of left ear pain.  There has been no ear congestion.  Denies headache fever or chills, nausea or vomiting.  Appetite is normal.  Dry cough with some tightness and wheeze.  No asthma history.  Denies myalgias or arthralgias.  Concerned about strep.   Review of Systems  Constitutional: Negative.  Negative for chills and fever.  HENT:  Positive for congestion and ear pain.   Eyes:  Negative for blurred vision, discharge and redness.  Respiratory:  Positive for cough, wheezing and stridor. Negative for shortness of breath.   Cardiovascular: Negative.   Gastrointestinal:  Negative for abdominal pain, nausea and vomiting.  Genitourinary: Negative.   Musculoskeletal: Negative.  Negative for joint pain and myalgias.  Skin:  Negative for rash.  Neurological:  Negative for tingling, loss of consciousness, weakness and headaches.  Endo/Heme/Allergies:  Negative for polydipsia.     Current Outpatient Medications:    cetirizine (ZYRTEC) 5 MG tablet, Take 5 mg by mouth daily., Disp: , Rfl:    Cholecalciferol (VITAMIN D3 PO), Take by mouth daily., Disp: , Rfl:    famotidine (PEPCID) 10 MG tablet, Take 10 mg by mouth 2 (two) times daily., Disp: , Rfl:     ferrous sulfate 325 (65 FE) MG EC tablet, Take 1 tablet (325 mg total) by mouth daily with breakfast., Disp: 120 tablet, Rfl: 0   omeprazole (PRILOSEC OTC) 20 MG tablet, Take 20 mg by mouth daily., Disp: , Rfl:    predniSONE (DELTASONE) 20 MG tablet, Take 1 tablet (20 mg total) by mouth 2 (two) times daily with a meal for 7 days., Disp: 14 tablet, Rfl: 0   Probiotic Product (PROBIOTIC DAILY PO), Take 1 tablet by mouth daily., Disp: , Rfl:    Simethicone (GAS-X PO), Take 1 tablet by mouth daily at 2 PM., Disp: , Rfl:    Objective:     BP 136/74 (BP Location: Right Arm, Patient Position: Sitting, Cuff Size: Large)   Pulse 90   Temp 98.2 F (36.8 C) (Temporal)   Ht '5\' 2"'$  (1.575 m)   Wt 252 lb 9.6 oz (114.6 kg)   SpO2 96%   BMI 46.20 kg/m    Physical Exam Constitutional:      General: She is not in acute distress.    Appearance: Normal appearance. She is not ill-appearing, toxic-appearing or diaphoretic.  HENT:     Head: Normocephalic and atraumatic.     Right Ear: Tympanic membrane, ear canal and external ear normal.     Left Ear: Ear canal and external ear normal.  No middle ear effusion. Tympanic membrane is retracted. Tympanic membrane is not erythematous.     Mouth/Throat:     Mouth: Mucous membranes  are moist.     Pharynx: Oropharynx is clear. No oropharyngeal exudate or posterior oropharyngeal erythema.  Eyes:     General: No scleral icterus.       Right eye: No discharge.        Left eye: No discharge.     Extraocular Movements: Extraocular movements intact.     Conjunctiva/sclera: Conjunctivae normal.     Pupils: Pupils are equal, round, and reactive to light.  Neck:     Thyroid: No thyromegaly.  Cardiovascular:     Rate and Rhythm: Normal rate and regular rhythm.  Pulmonary:     Effort: Pulmonary effort is normal. No respiratory distress.     Breath sounds: Normal breath sounds. No wheezing or rales.  Abdominal:     General: Bowel sounds are normal.   Musculoskeletal:     Cervical back: No rigidity or tenderness.  Lymphadenopathy:     Cervical: No cervical adenopathy.  Skin:    General: Skin is warm and dry.  Neurological:     Mental Status: She is alert and oriented to person, place, and time.  Psychiatric:        Mood and Affect: Mood normal.        Behavior: Behavior normal.      No results found for any visits on 02/21/22.    The 10-year ASCVD risk score (Arnett DK, et al., 2019) is: 0.7%    Assessment & Plan:   Pharyngitis, unspecified etiology -     POCT rapid strep A  Upper respiratory tract infection, unspecified type -     COVID-19, Flu A+B and RSV  Dysfunction of left eustachian tube -     predniSONE; Take 1 tablet (20 mg total) by mouth 2 (two) times daily with a meal for 7 days.  Dispense: 14 tablet; Refill: 0  Mild intermittent reactive airway disease without complication -     predniSONE; Take 1 tablet (20 mg total) by mouth 2 (two) times daily with a meal for 7 days.  Dispense: 14 tablet; Refill: 0    Return in about 5 days (around 02/26/2022), or if symptoms worsen or fail to improve.  Brief course of prednisone 20 twice daily for reactive airway and ETD.  Information was given on ETD and viral syndrome.  Question RSV.  Sending.  PCR test.  Will take 2 days.  Libby Maw, MD

## 2022-02-23 LAB — COVID-19, FLU A+B AND RSV
Influenza A, NAA: NOT DETECTED
Influenza B, NAA: NOT DETECTED
RSV, NAA: NOT DETECTED
SARS-CoV-2, NAA: NOT DETECTED

## 2022-03-10 IMAGING — MG MM DIGITAL SCREENING BILAT W/ TOMO AND CAD
8 series · 8 of 24 positions shown · non-contrast
Comparison: Previous exam(s).

CLINICAL DATA: Screening.

EXAM:
DIGITAL SCREENING BILATERAL MAMMOGRAM WITH TOMOSYNTHESIS AND CAD
TECHNIQUE: Bilateral screening digital craniocaudal and mediolateral oblique
mammograms were obtained. Bilateral screening digital breast
tomosynthesis was performed. The images were evaluated with
computer-aided detection.

[L CC synth-2D]
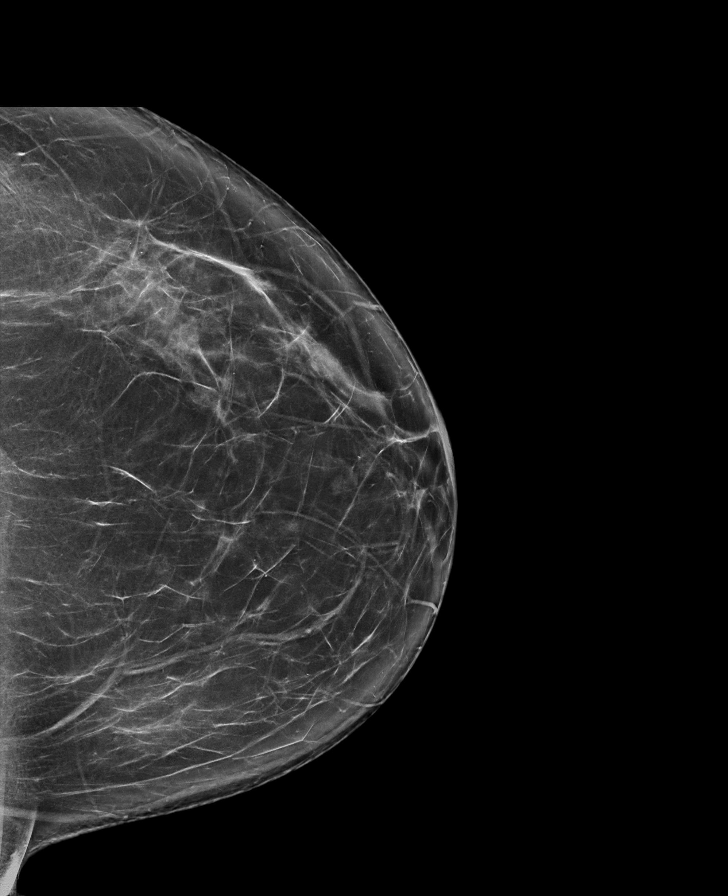

[R MLO synth-2D]
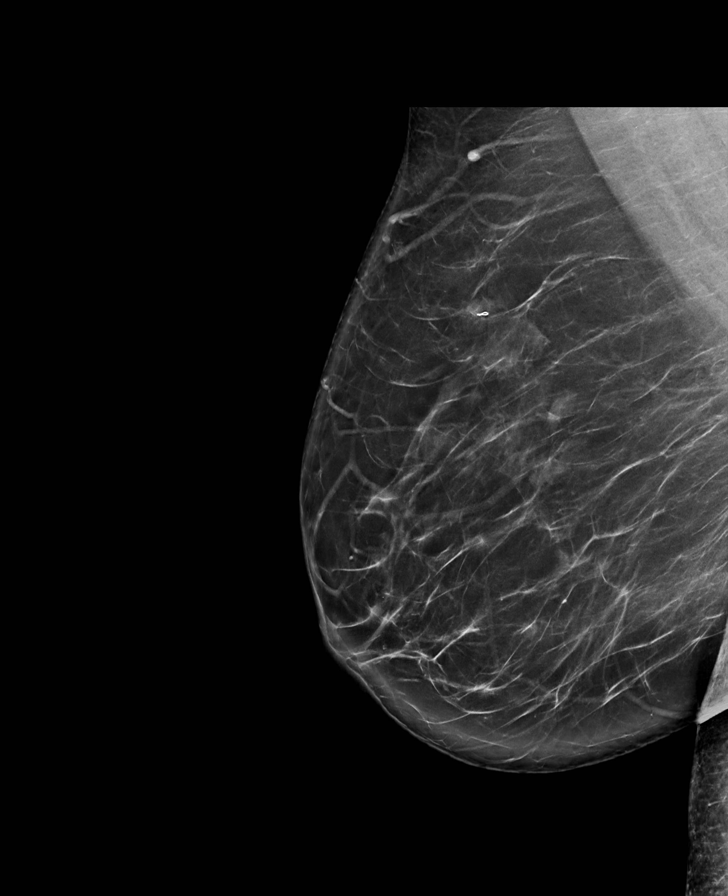

[R CC synth-2D]
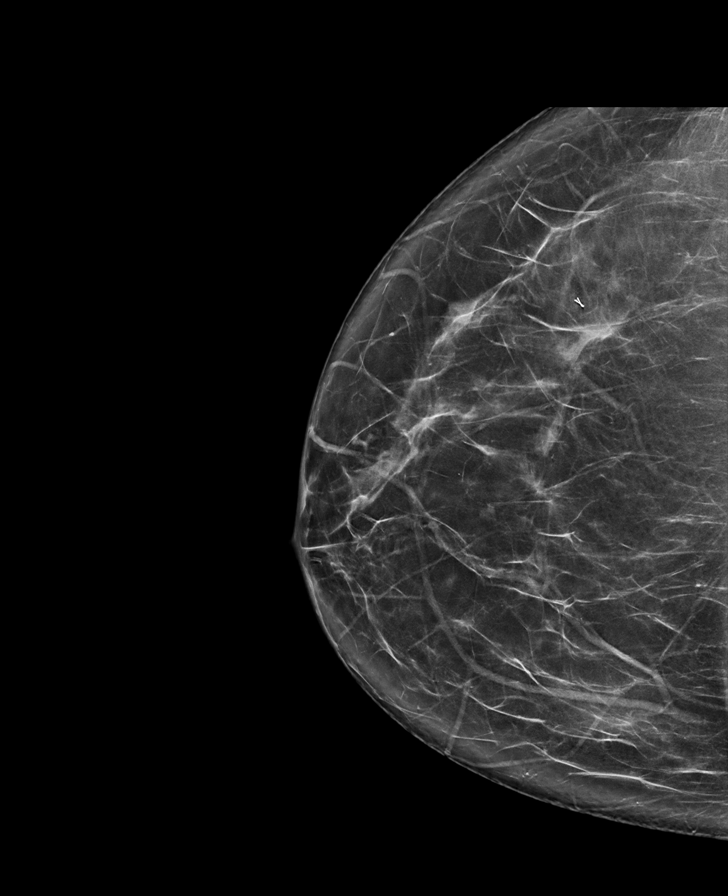

[L MLO synth-2D]
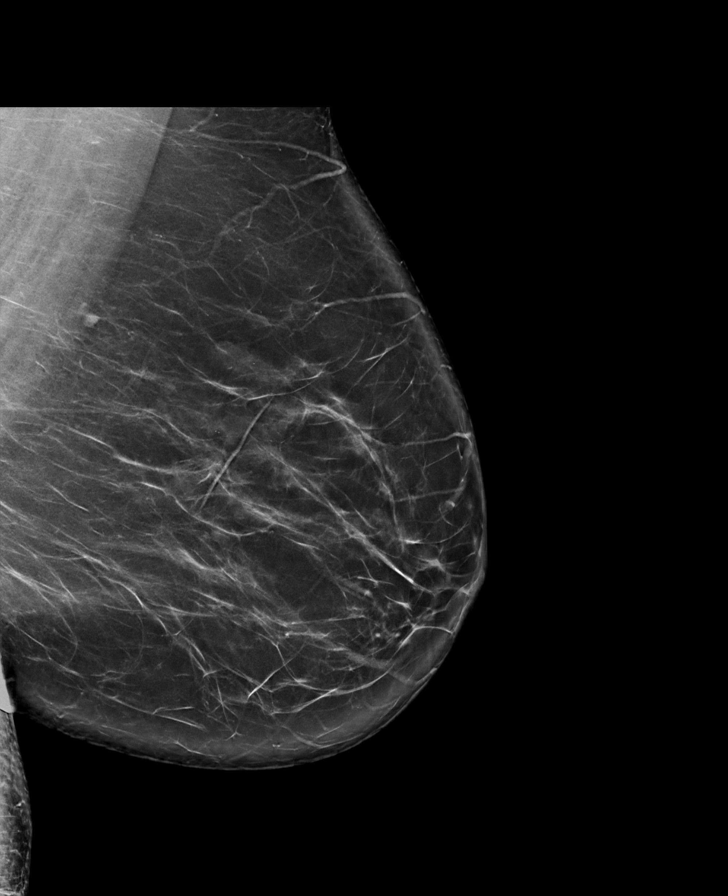

[R MLO tomo · tomo slice 49/96.0]
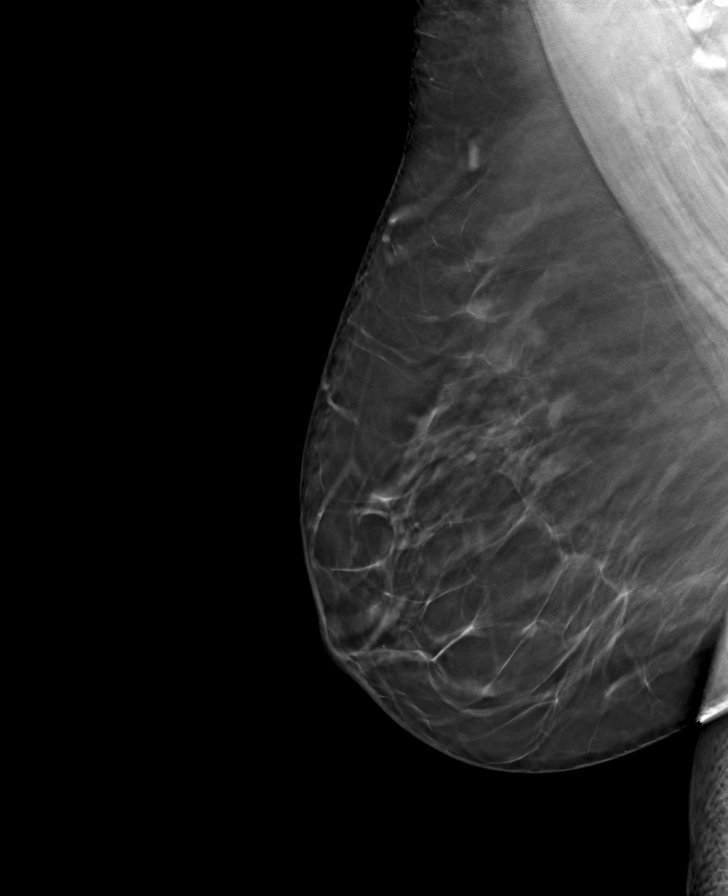

[R CC tomo · tomo slice 43/84.0]
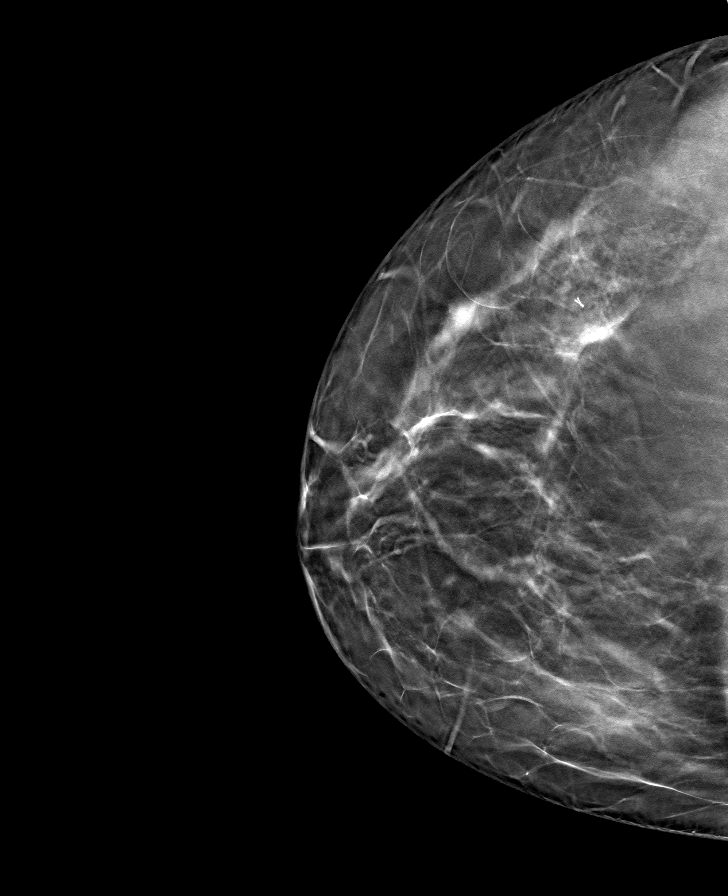

[L CC tomo · tomo slice 43/86.0]
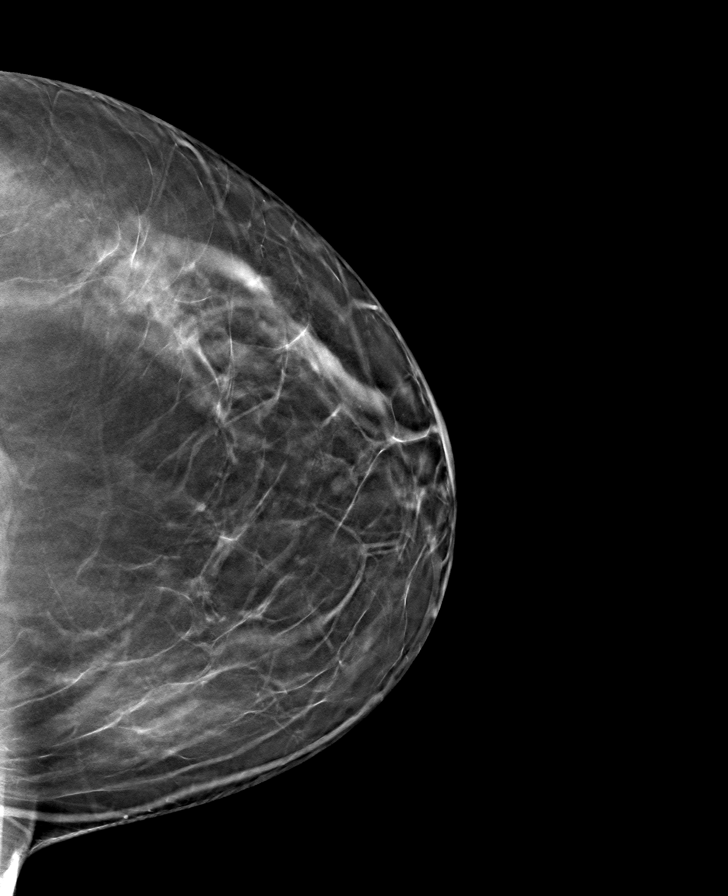

[L MLO tomo · tomo slice 49/97.0]
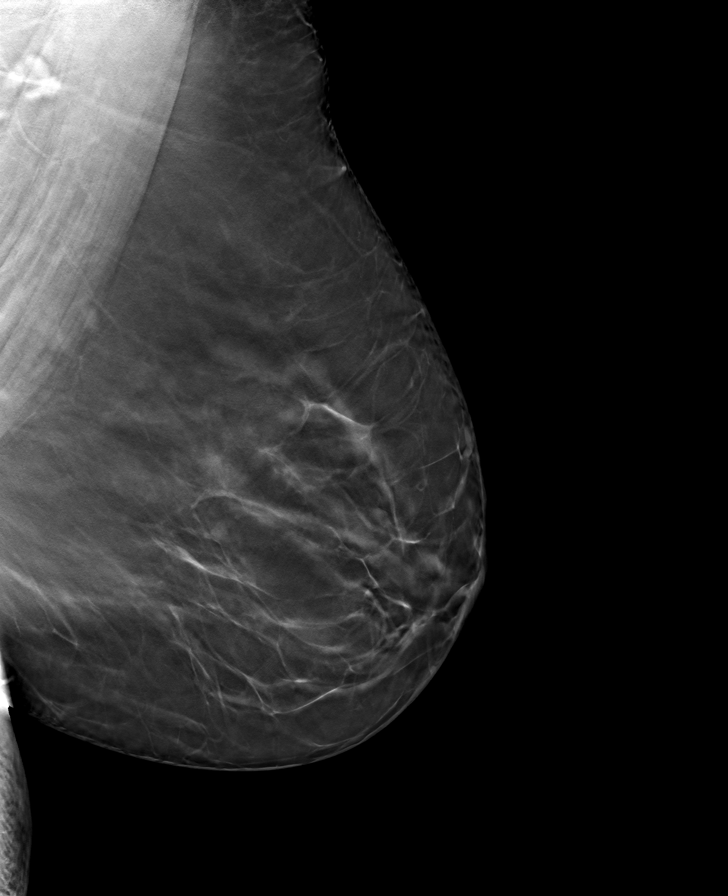

[8 of 24 positions shown; findings below may reference images not displayed]

ACR Breast Density Category b: There are scattered areas of
fibroglandular density.
FINDINGS: There are no findings suspicious for malignancy.
IMPRESSION: No mammographic evidence of malignancy. A result letter of this
screening mammogram will be mailed directly to the patient.

RECOMMENDATION:
Screening mammogram in one year. (Code:51-O-LD2)

BI-RADS CATEGORY  1: Negative.

## 2022-06-05 ENCOUNTER — Ambulatory Visit (INDEPENDENT_AMBULATORY_CARE_PROVIDER_SITE_OTHER)
Admission: RE | Admit: 2022-06-05 | Discharge: 2022-06-05 | Disposition: A | Discharge status: Home / Self Care | Payer: BC Managed Care – PPO | Source: Ambulatory Visit | Attending: Nurse Practitioner | Admitting: Nurse Practitioner

## 2022-06-05 ENCOUNTER — Ambulatory Visit: Payer: BC Managed Care – PPO | Admitting: Nurse Practitioner

## 2022-06-05 ENCOUNTER — Encounter: Payer: Self-pay | Admitting: Nurse Practitioner

## 2022-06-05 VITALS — BP 124/66 | HR 88 | Temp 98.4°F | Resp 16 | Ht 62.0 in | Wt 251.0 lb

## 2022-06-05 DIAGNOSIS — W540XXA Bitten by dog, initial encounter: Secondary | ICD-10-CM | POA: Insufficient documentation

## 2022-06-05 DIAGNOSIS — M79645 Pain in left finger(s): Secondary | ICD-10-CM | POA: Insufficient documentation

## 2022-06-05 DIAGNOSIS — W540XXD Bitten by dog, subsequent encounter: Secondary | ICD-10-CM

## 2022-06-05 DIAGNOSIS — Z23 Encounter for immunization: Secondary | ICD-10-CM | POA: Diagnosis not present

## 2022-06-05 NOTE — Progress Notes (Signed)
   Acute Office Visit  Subjective:     Patient ID: Ariana Mcdaniel, female    DOB: Oct 01, 1975, 47 y.o.   MRN: [id number]  Chief Complaint  Patient presents with   Animal Bite    Last Tuesday ring finger left hand     Patient is in today for dog bite with a history of insulin resistance, depression, pre diabetes, and obesity.  States that it happened last Tuesday 05/27/2022 States that she was seen at [facility] and was xray of the one figner. Doxy and flagyl that she is taking most of time.  States that she feels it is improving No fever or chills. States that no warmth.  Neo sporin and cover it at work and there is bloody yellow drainage    Review of Systems  Constitutional:  Negative for chills and fever.  Musculoskeletal:  Positive for joint pain.  Neurological:  Positive for weakness. Negative for tingling.        Objective:    BP 124/66   Pulse 88   Temp 98.4 F (36.9 C)   Resp 16   Ht 5\' 2"  (1.575 m)   Wt 251 lb (113.9 kg)   LMP 05/20/2022 (Exact Date)   SpO2 98%   BMI 45.91 kg/m    Physical Exam Vitals and nursing note reviewed.  Constitutional:      Appearance: Normal appearance.  Cardiovascular:     Rate and Rhythm: Normal rate and regular rhythm.     Pulses:          Radial pulses are 2+ on the left side.     Heart sounds: Normal heart sounds.  Pulmonary:     Effort: Pulmonary effort is normal.     Breath sounds: Normal breath sounds.  Skin:    Capillary Refill: Capillary refill takes less than 2 seconds.     Comments: See clinical photo.  No warmth to touch No appreciable discharge   Neurological:     Mental Status: She is alert.        No results found for any visits on 06/05/22.      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  Does not look infected just looks like signs of healing with granulation tissue.  Patient to continue finishing the antibiotics as prescribed keeping it clean with gentle soap and water and keeping it covered at work.  Will update the patient's tetanus shot today      Relevant Orders   DG Hand Complete Left   Other Visit Diagnoses     Need for Tdap vaccination       Relevant Orders   Tdap vaccine greater than or equal to 7yo IM       No orders of the defined types were placed in this encounter.   No follow-ups on file.  Romilda Garret, NP

## 2022-06-05 NOTE — Assessment & Plan Note (Signed)
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  Does not look infected just looks like signs of healing with granulation tissue.  Patient to continue finishing the antibiotics as prescribed keeping it clean with gentle soap and water and keeping it covered at work.  Will update the patient's tetanus shot today

## 2022-06-05 NOTE — Patient Instructions (Signed)
Nice to see you  I will be in touch with the xrays once I have reviewed the results Follow up if not improving Continue taking the antibiotics as prescribed and be sure to finish them all

## 2022-06-05 NOTE — Assessment & Plan Note (Signed)
Patient is having pain to the adjacent finger with some swelling and difficulty closing will obtain picture of left hand pending result

## 2022-07-22 ENCOUNTER — Telehealth: Payer: Self-pay | Admitting: Internal Medicine

## 2022-07-22 NOTE — Telephone Encounter (Signed)
FYI: This call has been transferred to Access Nurse. Once the result note has been entered staff can address the message at that time.  Patient called in with the following symptoms:  Red Word: tick bite Saturday, raised, white bumps, redness spreading   Please advise at [facility] [phone]  Message is routed to Provider Pool and [facility] Triage

## 2022-07-22 NOTE — Telephone Encounter (Signed)
Pt is scheduled for a visit with Dr. Alphonsus Sias 07/23/22 @ 12:15.

## 2022-07-23 ENCOUNTER — Ambulatory Visit: Payer: BC Managed Care – PPO | Admitting: Internal Medicine

## 2022-07-23 ENCOUNTER — Encounter: Payer: Self-pay | Admitting: Internal Medicine

## 2022-07-23 VITALS — BP 118/74 | HR 82 | Temp 97.6°F | Ht 62.0 in | Wt 249.0 lb

## 2022-07-23 DIAGNOSIS — L03317 Cellulitis of buttock: Secondary | ICD-10-CM | POA: Insufficient documentation

## 2022-07-23 MED ORDER — DOXYCYCLINE HYCLATE 100 MG PO TABS: 100.0000 mg | ORAL_TABLET | Freq: Two times a day (BID) | ORAL | 0 refills | Status: DC

## 2022-07-23 NOTE — Telephone Encounter (Signed)
She does have an appointment today .

## 2022-07-23 NOTE — Assessment & Plan Note (Signed)
After tick bite No retained parts She describes what could have been ECM rash but not evident now Will treat with doxy 100 bid x 10 days

## 2022-07-23 NOTE — Progress Notes (Signed)
Subjective:    Patient ID: Ariana Mcdaniel, female    DOB: 23-Sep-1975, 47 y.o.   MRN: [id number]  HPI Here due to tick bite  Has had 2 tick bites 4 days ago One on back of neck and another on her right buttock  Now notes increasing redness around the buttock area Seems to have bruise or "blood blister" in it  Red splotchy rash and seems to be spreading Now gone  Current Outpatient Medications on File Prior to Visit  Medication Sig Dispense Refill   cetirizine (ZYRTEC) 5 MG tablet Take 5 mg by mouth daily.     famotidine (PEPCID) 10 MG tablet Take 10 mg by mouth daily as needed.     omeprazole (PRILOSEC OTC) 20 MG tablet Take 20 mg by mouth daily.     Probiotic Product (PROBIOTIC DAILY PO) Take 1 tablet by mouth daily.     Simethicone (GAS-X PO) Take 1 tablet by mouth daily at 2 PM.     No current facility-administered medications on file prior to visit.    Allergies  Allergen Reactions   Wellbutrin [Bupropion] Hives   Amoxicillin     itching   Clindamycin/Lincomycin     Caused a superinfection   Oxycodone Hives   Oxycodone-Acetaminophen Hives    Past Medical History:  Diagnosis Date   Allergic rhinitis due to pollen    DVT (deep venous thrombosis) (HCC) 01/2017   Fatty liver    GERD (gastroesophageal reflux disease)    Mastodynia    Palpitations    PONV (postoperative nausea and vomiting)    Renal stones     Past Surgical History:  Procedure Laterality Date   BREAST BIOPSY Bilateral    BREAST EXCISIONAL BIOPSY Left 04/2020   BREAST LUMPECTOMY WITH RADIOACTIVE SEED LOCALIZATION Left 04/25/2020   Procedure: LEFT BREAST LUMPECTOMY WITH RADIOACTIVE SEED LOCALIZATION;  Surgeon: Harriette Bouillon, MD;  Location: [facility];  Service: General;  Laterality: Left;   TONSILLECTOMY     WISDOM TOOTH EXTRACTION      Family History  Problem Relation Age of Onset   Thyroid disease Mother    High blood pressure Father    High Cholesterol Father    Celiac  disease Sister    Celiac disease Sister    Hypertension Maternal Grandmother    Diabetes Maternal Grandmother    Stroke Maternal Grandmother    Depression Paternal Grandmother    Cancer Paternal Grandfather        non-hodgkin's lymphoma   Heart disease Neg Hx    Colon cancer Neg Hx    Esophageal cancer Neg Hx    Pancreatic cancer Neg Hx    Stomach cancer Neg Hx     Social History   Socioeconomic History   Marital status: Married    Spouse name: Greig Castilla   Number of children: 2   Years of education: Not on file   Highest education level: Not on file  Occupational History   Occupation: Dealer: [employer]  Tobacco Use   Smoking status: Former    Types: Cigarettes    Quit date: 01/04/2010    Years since quitting: 12.5   Smokeless tobacco: Never  Vaping Use   Vaping Use: Never used  Substance and Sexual Activity   Alcohol use: Not Currently    Comment: 3-4 beers per week   Drug use: No   Sexual activity: Yes    Partners: Male    Birth  control/protection: None    Comment: husband had Vasectomy  Other Topics Concern   Not on file  Social History Narrative   2nd marriage   Social Determinants of Health   Financial Resource Strain: Not on file  Food Insecurity: Not on file  Transportation Needs: Not on file  Physical Activity: Not on file  Stress: Not on file  Social Connections: Not on file  Intimate Partner Violence: Not on file   Review of Systems No fever Some headache yesterday and today     Objective:   Physical Exam Constitutional:      Appearance: Normal appearance.  Skin:    Comments: No inflammation on posterior neck  On right buttock--black eschar surrounded by 2 cm of warmth and erythema  Neurological:     Mental Status: She is alert.            Assessment & Plan:

## 2022-08-05 ENCOUNTER — Telehealth: Payer: Self-pay | Admitting: Internal Medicine

## 2022-08-05 NOTE — Telephone Encounter (Signed)
Pt called stating she saw [facility] on 5/15 & was prescribed the meds, doxycycline (VIBRA-TABS) 100 MG tablet [[id number]]. Pt states she has now developed a yeast infection from the meds & is requesting for some antibiotics to be called in? Call back # [phone]

## 2022-08-06 MED ORDER — FLUCONAZOLE 150 MG PO TABS: 150.0000 mg | ORAL_TABLET | ORAL | 1 refills | Status: DC

## 2022-08-06 NOTE — Telephone Encounter (Signed)
Spoke to pt. Advised her the medication was sent in for her.

## 2022-08-06 NOTE — Addendum Note (Signed)
Addended by: Tillman Abide I on: 08/06/2022 07:28 AM   Modules accepted: Orders

## 2022-08-10 ENCOUNTER — Encounter: Payer: Self-pay | Admitting: Internal Medicine

## 2022-08-15 ENCOUNTER — Encounter: Payer: Self-pay | Admitting: Primary Care

## 2022-08-15 ENCOUNTER — Ambulatory Visit: Payer: BC Managed Care – PPO | Admitting: Primary Care

## 2022-08-15 VITALS — BP 132/84 | HR 80 | Temp 98.2°F | Ht 62.0 in | Wt 246.0 lb

## 2022-08-15 DIAGNOSIS — L03317 Cellulitis of buttock: Secondary | ICD-10-CM

## 2022-08-15 DIAGNOSIS — R0602 Shortness of breath: Secondary | ICD-10-CM | POA: Insufficient documentation

## 2022-08-15 LAB — CBC
HCT: 39.5 % (ref 36.0–46.0)
Hemoglobin: 12.8 g/dL (ref 12.0–15.0)
MCHC: 32.4 g/dL (ref 30.0–36.0)
MCV: 79.4 fl (ref 78.0–100.0)
Platelets: 294 10*3/uL (ref 150.0–400.0)
RBC: 4.97 Mil/uL (ref 3.87–5.11)
RDW: 15.3 % (ref 11.5–15.5)
WBC: 7.1 10*3/uL (ref 4.0–10.5)

## 2022-08-15 NOTE — Progress Notes (Signed)
Subjective:    Patient ID: Ariana Mcdaniel, female    DOB: 05-Apr-1975, 47 y.o.   MRN: [id number]  HPI  Ariana Mcdaniel is a very pleasant 47 y.o. female patient of Dr. Alphonsus Sias with a history of obesity, hyperlipidemia, cellulitis of buttock who presents today to discuss dyspnea.   Evaluated on 07/23/2022 by PCP for 2 tick bites that occurred 4 days prior.  One tick was removed from the posterior neck and the other from her right buttock.  She was diagnosed with cellulitis of the buttock given redness in the area, treated with doxycycline 100 mg twice daily x 10 days.  She contacted our office 5 days ago reporting inconsistency with doxycycline administration, and symptoms of headache, palpitations, shortness of breath, joint/muscle pain.  Today she has been consistent with Doxycycline 100 mg once daily for 1 week. Her right buttock swelling, and redness have resolved she continues to notice shortness of breath which is her main concern. Her palpitations and headaches have improved.   Her shortness of breath occurs with talking in any form of exertion. She has a history of DVT about 6 years ago.  She has not managed on OCPs and she is a non-smoker.  She denies recent travel or sedentary activity.  She is a non smoker, denies a history of asthma.  She also denies chest congestion, postnasal drip, ear fullness, cough, nausea/GI upset.  Wt Readings from Last 3 Encounters:  08/15/22 246 lb (111.6 kg)  07/23/22 249 lb (112.9 kg)  06/05/22 251 lb (113.9 kg)     Review of Systems  Constitutional:  Negative for chills and fever.  HENT:  Negative for congestion.   Respiratory:  Positive for shortness of breath. Negative for cough.   Cardiovascular:  Negative for chest pain and palpitations.  Neurological:  Positive for headaches.         Past Medical History:  Diagnosis Date   Allergic rhinitis due to pollen    DVT (deep venous thrombosis) (HCC) 01/2017   Fatty liver    GERD  (gastroesophageal reflux disease)    Mastodynia    Palpitations    PONV (postoperative nausea and vomiting)    Renal stones     Social History   Socioeconomic History   Marital status: Married    Spouse name: Greig Castilla   Number of children: 2   Years of education: Not on file   Highest education level: Not on file  Occupational History   Occupation: Dealer: [employer]  Tobacco Use   Smoking status: Former    Types: Cigarettes    Quit date: 01/04/2010    Years since quitting: 12.6   Smokeless tobacco: Never  Vaping Use   Vaping Use: Never used  Substance and Sexual Activity   Alcohol use: Not Currently    Comment: 3-4 beers per week   Drug use: No   Sexual activity: Yes    Partners: Male    Birth control/protection: None    Comment: husband had Vasectomy  Other Topics Concern   Not on file  Social History Narrative   2nd marriage   Social Determinants of Health   Financial Resource Strain: Not on file  Food Insecurity: Not on file  Transportation Needs: Not on file  Physical Activity: Not on file  Stress: Not on file  Social Connections: Not on file  Intimate Partner Violence: Not on file    Past Surgical History:  Procedure Laterality Date  BREAST BIOPSY Bilateral    BREAST EXCISIONAL BIOPSY Left 04/2020   BREAST LUMPECTOMY WITH RADIOACTIVE SEED LOCALIZATION Left 04/25/2020   Procedure: LEFT BREAST LUMPECTOMY WITH RADIOACTIVE SEED LOCALIZATION;  Surgeon: Harriette Bouillon, MD;  Location: [facility];  Service: General;  Laterality: Left;   TONSILLECTOMY     WISDOM TOOTH EXTRACTION      Family History  Problem Relation Age of Onset   Thyroid disease Mother    High blood pressure Father    High Cholesterol Father    Celiac disease Sister    Celiac disease Sister    Hypertension Maternal Grandmother    Diabetes Maternal Grandmother    Stroke Maternal Grandmother    Depression Paternal Grandmother    Cancer  Paternal Grandfather        non-hodgkin's lymphoma   Heart disease Neg Hx    Colon cancer Neg Hx    Esophageal cancer Neg Hx    Pancreatic cancer Neg Hx    Stomach cancer Neg Hx     Allergies  Allergen Reactions   Wellbutrin [Bupropion] Hives   Amoxicillin     itching   Clindamycin/Lincomycin     Caused a superinfection   Oxycodone Hives   Oxycodone-Acetaminophen Hives    Current Outpatient Medications on File Prior to Visit  Medication Sig Dispense Refill   cetirizine (ZYRTEC) 5 MG tablet Take 5 mg by mouth daily.     doxycycline (VIBRA-TABS) 100 MG tablet Take 1 tablet (100 mg total) by mouth 2 (two) times daily. 20 tablet 0   famotidine (PEPCID) 10 MG tablet Take 10 mg by mouth daily as needed.     omeprazole (PRILOSEC OTC) 20 MG tablet Take 20 mg by mouth daily.     Probiotic Product (PROBIOTIC DAILY PO) Take 1 tablet by mouth daily.     Simethicone (GAS-X PO) Take 1 tablet by mouth daily at 2 PM.     fluconazole (DIFLUCAN) 150 MG tablet Take 1 tablet (150 mg total) by mouth once a week. (Patient not taking: Reported on 08/15/2022) 2 tablet 1   No current facility-administered medications on file prior to visit.    BP 132/84   Pulse 80   Temp 98.2 F (36.8 C) (Temporal)   Ht 5\' 2"  (1.575 m)   Wt 246 lb (111.6 kg)   LMP 08/08/2022 (Exact Date)   SpO2 98%   BMI 44.99 kg/m  Objective:   Physical Exam Constitutional:      General: She is not in acute distress.    Appearance: She is not ill-appearing.  Cardiovascular:     Rate and Rhythm: Normal rate and regular rhythm.  Pulmonary:     Effort: Pulmonary effort is normal.     Breath sounds: Normal breath sounds. No wheezing or rhonchi.  Skin:    General: Skin is warm and dry.     Findings: No erythema.     Comments: No erythema or swelling to right buttock           Assessment & Plan:  There are no diagnoses linked to this encounter.      Doreene Nest, NP

## 2022-08-15 NOTE — Assessment & Plan Note (Addendum)
Unclear etiology as there are no respiratory symptoms or obvious causes for her shortness of breath.  Could be secondary to tick bite. Checking D-dimer today given history of DVT. Add CBC.  Less likely Lyme disease, especially as symptoms are improving.  Also too soon to test.  No symptoms of alpha gal.  Consider short course of prednisone, although not sure if this would help. Consider antihistamine daily.  No need for chest x-ray as lungs are clear and she is without respiratory symptoms.  Await results.

## 2022-08-15 NOTE — Patient Instructions (Addendum)
Stop by the lab prior to leaving today. I will notify you of your results once received.   Complete your doxycycline antibiotic as prescribed.  It was a pleasure meeting you!

## 2022-08-15 NOTE — Assessment & Plan Note (Signed)
Resolved.  Continue doxycycline as prescribed until completed.

## 2022-08-16 ENCOUNTER — Emergency Department (HOSPITAL_COMMUNITY)
Admission: EM | Admit: 2022-08-16 | Discharge: 2022-08-16 | Disposition: A | Discharge status: Home / Self Care | Payer: BC Managed Care – PPO | Attending: Emergency Medicine | Admitting: Emergency Medicine

## 2022-08-16 ENCOUNTER — Other Ambulatory Visit: Payer: Self-pay

## 2022-08-16 ENCOUNTER — Emergency Department (HOSPITAL_COMMUNITY): Payer: BC Managed Care – PPO

## 2022-08-16 ENCOUNTER — Emergency Department (HOSPITAL_BASED_OUTPATIENT_CLINIC_OR_DEPARTMENT_OTHER): Payer: BC Managed Care – PPO

## 2022-08-16 ENCOUNTER — Encounter (HOSPITAL_COMMUNITY): Payer: Self-pay

## 2022-08-16 DIAGNOSIS — R600 Localized edema: Secondary | ICD-10-CM | POA: Diagnosis not present

## 2022-08-16 DIAGNOSIS — I2694 Multiple subsegmental pulmonary emboli without acute cor pulmonale: Secondary | ICD-10-CM | POA: Diagnosis not present

## 2022-08-16 DIAGNOSIS — Z86718 Personal history of other venous thrombosis and embolism: Secondary | ICD-10-CM | POA: Diagnosis not present

## 2022-08-16 DIAGNOSIS — R609 Edema, unspecified: Secondary | ICD-10-CM | POA: Diagnosis not present

## 2022-08-16 DIAGNOSIS — R0602 Shortness of breath: Secondary | ICD-10-CM | POA: Diagnosis present

## 2022-08-16 DIAGNOSIS — I2699 Other pulmonary embolism without acute cor pulmonale: Secondary | ICD-10-CM | POA: Diagnosis not present

## 2022-08-16 LAB — COMPREHENSIVE METABOLIC PANEL
ALT: 33 U/L (ref 0–44)
AST: 25 U/L (ref 15–41)
Albumin: 3.5 g/dL (ref 3.5–5.0)
Alkaline Phosphatase: 85 U/L (ref 38–126)
Anion gap: 13 (ref 5–15)
BUN: 7 mg/dL (ref 6–20)
CO2: 21 mmol/L — ABNORMAL LOW (ref 22–32)
Calcium: 8.8 mg/dL — ABNORMAL LOW (ref 8.9–10.3)
Chloride: 104 mmol/L (ref 98–111)
Creatinine, Ser: 0.77 mg/dL (ref 0.44–1.00)
GFR, Estimated: >60 mL/min (ref >60)
Glucose, Bld: 132 mg/dL — ABNORMAL HIGH (ref 70–99)
Potassium: 3.8 mmol/L (ref 3.5–5.1)
Sodium: 138 mmol/L (ref 135–145)
Total Bilirubin: 0.4 mg/dL (ref 0.3–1.2)
Total Protein: 7.1 g/dL (ref 6.5–8.1)

## 2022-08-16 LAB — CBC WITH DIFFERENTIAL/PLATELET
Abs Immature Granulocytes: 0.03 10*3/uL (ref 0.00–0.07)
Basophils Absolute: 0 10*3/uL (ref 0.0–0.1)
Basophils Relative: 0 %
Eosinophils Absolute: 0.1 10*3/uL (ref 0.0–0.5)
Eosinophils Relative: 1 %
HCT: 42.4 % (ref 36.0–46.0)
Hemoglobin: 13.1 g/dL (ref 12.0–15.0)
Immature Granulocytes: 0 %
Lymphocytes Relative: 24 %
Lymphs Abs: 2.5 10*3/uL (ref 0.7–4.0)
MCH: 25 pg — ABNORMAL LOW (ref 26.0–34.0)
MCHC: 30.9 g/dL (ref 30.0–36.0)
MCV: 80.8 fL (ref 80.0–100.0)
Monocytes Absolute: 0.4 10*3/uL (ref 0.1–1.0)
Monocytes Relative: 4 %
Neutro Abs: 7.2 10*3/uL (ref 1.7–7.7)
Neutrophils Relative %: 71 %
Platelets: 331 10*3/uL (ref 150–400)
RBC: 5.25 MIL/uL — ABNORMAL HIGH (ref 3.87–5.11)
RDW: 14.3 % (ref 11.5–15.5)
WBC: 10.2 10*3/uL (ref 4.0–10.5)
nRBC: 0 % (ref 0.0–0.2)

## 2022-08-16 LAB — BRAIN NATRIURETIC PEPTIDE: B Natriuretic Peptide: 19.4 pg/mL (ref 0.0–100.0)

## 2022-08-16 LAB — D-DIMER, QUANTITATIVE: D-Dimer, Quant: 3.58 mcg/mL FEU — ABNORMAL HIGH (ref <0.50)

## 2022-08-16 LAB — TROPONIN I (HIGH SENSITIVITY): Troponin I (High Sensitivity): 4 ng/L (ref <18)

## 2022-08-16 MED ORDER — APIXABAN 5 MG PO TABS: 5.0000 mg | ORAL_TABLET | Freq: Two times a day (BID) | ORAL | Status: DC

## 2022-08-16 MED ORDER — IOHEXOL 350 MG/ML SOLN
75.0000 mL | Freq: Once | INTRAVENOUS | Status: AC | PRN
  Administered 2022-08-16: 75 mL via INTRAVENOUS

## 2022-08-16 MED ORDER — HEPARIN (PORCINE) 25000 UT/250ML-% IV SOLN: 1250.0000 [IU]/h | INTRAVENOUS | Status: DC

## 2022-08-16 MED ORDER — APIXABAN (ELIQUIS) VTE STARTER PACK (10MG AND 5MG): ORAL_TABLET | ORAL | 0 refills | Status: DC

## 2022-08-16 MED ORDER — RIVAROXABAN 15 MG PO TABS: 15.0000 mg | ORAL_TABLET | Freq: Two times a day (BID) | ORAL | Status: DC

## 2022-08-16 MED ORDER — HEPARIN BOLUS VIA INFUSION
4500.0000 [IU] | Freq: Once | INTRAVENOUS | Status: DC
  Filled 2022-08-16: qty 4500

## 2022-08-16 MED ORDER — RIVAROXABAN 10 MG PO TABS: 20.0000 mg | ORAL_TABLET | Freq: Every day | ORAL | Status: DC

## 2022-08-16 MED ORDER — APIXABAN 5 MG PO TABS
10.0000 mg | ORAL_TABLET | Freq: Two times a day (BID) | ORAL | Status: DC
  Administered 2022-08-16: 10 mg via ORAL
  Filled 2022-08-16: qty 2

## 2022-08-16 MED ORDER — APIXABAN 5 MG PO TABS: 10.0000 mg | ORAL_TABLET | Freq: Two times a day (BID) | ORAL | Status: DC

## 2022-08-16 NOTE — Consult Note (Signed)
Initial Consultation Note   Patient: Ariana Mcdaniel:[id number] DOB: [birth year] PCP: Karie Schwalbe, MD DOA: 08/16/2022 DOS: the patient was seen and examined on 08/16/2022 Primary service: Wynetta Fines, MD  Referring physician: Barnie Alderman, PA-C Reason for consult:  Pulmonary embolus   Assessment/Plan: Assessment and Plan:  Bilateral pulmonary embolism History of DVT Patient presents with complaints of shortness of breath.  O2 saturations currently maintained on room air.  D-dimer was noted to be elevated at 3.58 for which CT angiogram of the chest have been obtained showing bilateral pulmonary emboli of the lower lobes without signs of right heart strain.  Doppler ultrasound of the lower extremities showed no signs of a DVT.  She was noted to be hemodynamically stable.  Initial orders have been placed to start patient on heparin drip.  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 Pamelia Hoit.  Patient has other risk factors for having a DVT including prolonged immobilization with work, but as this is her second episode of having a thromboembolism lifelong anticoagulation recommended.  Coagulation factor VIII abnormality could also be a factor for your increased risk for clotting. -Check pulse oximetry with ambulation to evaluate for possible need of oxygen at discharge which can be arranged by social worker -Recommend starting oral anticoagulation of Eliquis  -Patient needs referral to Dr. Serena Croissant of oncology hematology -Recommend other family member who had previous DVT also be evaluated for need of being on lifelong anticoagulation -Okay to discharge home from medical standpoint as patient was not noted to have any concern for DVT which could cause worsening symptoms   TRH will sign off at present, please call us again when needed.  HPI: Ariana Mcdaniel is a 47  y.o. female with past medical history of DVT, fatty liver, kidney stones, obesity, and GERD who presents with complaints of shortness of breath over the last 2 weeks.  Patient reported being bit by a tick with development of a rash treated with doxycycline.  However had noted that she was having increased shortness of breath with exertion, but also at rest.  Patient does note having prior history of a DVT after having a torn calf muscle on her left leg.  She was treated with Xarelto for some time but symptoms were thought likely to be provoked.  Review of records note she was referred to Dr. Pamelia Hoit of hematology oncology in 2019 as it appeared that her sister also had been diagnosed with a DVT months prior.  At that time that time had hypercoagulable panel obtained which noted elevated coagulation factor VIII 201 back in 05/2017.  Repeat ultrasound of the lower extremity did not show persistence of blood clot for which patient had been advised she could discontinue anticoagulation.  Patient does admit that she sits for prolonged periods of time as she works as a Solicitor at [employer].  Here in the emergency department patient was noted to have O2 saturations maintained on room air.  Labs significant for D-dimer of 3.58.  CT angiogram of the chest noted bilateral pulmonary emboli in the lower lobes without concern for right heart strain.  Doppler ultrasound of lower extremities did not note any signs of a DVT at this time.    Review of Systems: As mentioned in the history of present illness. All other systems reviewed and are negative. Past Medical History:  Diagnosis Date   Allergic rhinitis due to pollen    DVT (deep venous thrombosis) (HCC) 01/2017   Fatty liver    GERD (gastroesophageal reflux disease)    Mastodynia    Palpitations    PONV (postoperative nausea and vomiting)    Renal stones    Past Surgical History:  Procedure Laterality Date   BREAST BIOPSY Bilateral    BREAST EXCISIONAL  BIOPSY Left 04/2020   BREAST LUMPECTOMY WITH RADIOACTIVE SEED LOCALIZATION Left 04/25/2020   Procedure: LEFT BREAST LUMPECTOMY WITH RADIOACTIVE SEED LOCALIZATION;  Surgeon: Harriette Bouillon, MD;  Location: [facility];  Service: General;  Laterality: Left;   TONSILLECTOMY     WISDOM TOOTH EXTRACTION     Social History:  reports that she quit smoking about 12 years ago. Her smoking use included cigarettes. She has never used smokeless tobacco. She reports that she does not currently use alcohol. She reports that she does not use drugs.  Allergies  Allergen Reactions   Wellbutrin [Bupropion] Hives   Amoxicillin     itching   Clindamycin/Lincomycin     Caused a superinfection   Oxycodone Hives   Oxycodone-Acetaminophen Hives    Family History  Problem Relation Age of Onset   Thyroid disease Mother    High blood pressure Father    High Cholesterol Father    Celiac disease Sister    Celiac disease Sister    Hypertension Maternal Grandmother    Diabetes Maternal Grandmother    Stroke Maternal Grandmother    Depression Paternal Grandmother    Cancer Paternal Grandfather        non-hodgkin's lymphoma   Heart disease Neg Hx    Colon cancer Neg Hx    Esophageal cancer Neg Hx    Pancreatic cancer Neg Hx    Stomach cancer Neg Hx     Prior to Admission medications   Medication Sig Start Date End Date Taking? Authorizing Provider  cetirizine (ZYRTEC) 5 MG tablet Take 5 mg by mouth daily.    [provider]  doxycycline (VIBRA-TABS) 100 MG tablet Take 1 tablet (100 mg total) by mouth 2 (two) times daily. 07/23/22   Karie Schwalbe, MD  famotidine (PEPCID) 10 MG tablet Take 10 mg by mouth daily as needed.    [provider]  fluconazole (DIFLUCAN) 150 MG tablet Take 1 tablet (150 mg total) by mouth once a week. Patient not taking: Reported on 08/15/2022 08/06/22   Karie Schwalbe, MD  omeprazole (PRILOSEC OTC) 20 MG tablet Take 20 mg by mouth daily.     [provider]  Probiotic Product (PROBIOTIC DAILY PO) Take 1 tablet by mouth daily.    [provider]  Simethicone (GAS-X PO) Take 1 tablet by mouth daily at 2 PM.    [provider]    Physical Exam: Vitals:   08/16/22 1226 08/16/22 1315  BP: (!) 151/105 (!) 140/76  Pulse: (!) 122 97  Resp: 16 15  Temp: 98.1 F (36.7 C)   SpO2: 97% 98%    Constitutional: Middle-age female currently in no acute distress Eyes: PERRL, lids and conjunctivae normal ENMT: Mucous membranes are moist.   Neck: normal, supple,   Respiratory: clear to auscultation bilaterally, no wheezing, no crackles.   Cardiovascular: Regular rate and rhythm, no murmurs / rubs / gallops.  1+ pitting bilateral lower extremity edema  Musculoskeletal: no clubbing / cyanosis. No joint deformity upper and lower extremities. Good ROM, no contractures. Normal muscle tone.  Skin: no rashes, lesions, ulcers. No induration Neurologic: CN 2-12 grossly intact. . Strength 5/5 in all 4.  Psychiatric: Normal judgment and insight. Alert and oriented x 3. Normal mood.   Data Reviewed:   Reviewed labs, imaging, and pertinent records as noted above in HPI    Family Communication: Husband updated at bedside Primary team communication:  Thank you very much for involving [place] in the care of your patient.  Author: Clydie Braun, MD 08/16/2022 3:44 PM  For on call review [email or web address].

## 2022-08-16 NOTE — ED Notes (Signed)
Patient transported to Ultrasound 

## 2022-08-16 NOTE — ED Notes (Signed)
Pt O2 sats remain above 93% while ambulating.

## 2022-08-16 NOTE — Discharge Instructions (Addendum)
Please follow-up with a hematologist as above, to evaluate why you may be getting these blood clots repeatedly.  Please take your apixaban as instructed.  Return to the ER if you have severe chest pain, shortness of breath, blood in your stool, blood in your vomit, or blood when you cough.

## 2022-08-16 NOTE — Progress Notes (Signed)
Bilateral lower extremity venous study completed.   Preliminary results relayed to PA and RN in ER.  Please see CV Procedures for preliminary results.  Tzipporah Nagorski, RVT  1:39 PM 08/16/22

## 2022-08-16 NOTE — ED Provider Notes (Signed)
[name] Provider Note   CSN: [id number] Arrival date & time: 08/16/22  1221     History  Chief Complaint  Patient presents with   Shortness of Breath    Ariana Mcdaniel is a 47 y.o. female, history of DVT, provoked, who presents to the ED secondary to shortness of breath has been going on for the last 2 weeks.  She states that she was bit by a tick the same day, and developed a rash, but was treated with doxycycline.  She notes since then she has just been very short of breath, and finds that is worse on exertion, but will also at rest.  She denies any chest pain, nausea, vomiting, fever, chills.  She does have a history of DVT, which was provoked, but is not on any blood thinners. Rash has resolved. Also reports L leg pain and cramping that has since resolved.     Home Medications Prior to Admission medications   Medication Sig Start Date End Date Taking? Authorizing Provider  APIXABAN Everlene Balls) VTE STARTER PACK (10MG  AND 5MG ) Take as directed on package: start with two-5mg  tablets twice daily for 7 days. On day 8, switch to one-5mg  tablet twice daily. 08/16/22  Yes Williard Keller L, PA  cetirizine (ZYRTEC) 5 MG tablet Take 5 mg by mouth daily.    [provider]  doxycycline (VIBRA-TABS) 100 MG tablet Take 1 tablet (100 mg total) by mouth 2 (two) times daily. 07/23/22   Karie Schwalbe, MD  famotidine (PEPCID) 10 MG tablet Take 10 mg by mouth daily as needed.    [provider]  fluconazole (DIFLUCAN) 150 MG tablet Take 1 tablet (150 mg total) by mouth once a week. Patient not taking: Reported on 08/15/2022 08/06/22   Karie Schwalbe, MD  omeprazole (PRILOSEC OTC) 20 MG tablet Take 20 mg by mouth daily.    [provider]  Probiotic Product (PROBIOTIC DAILY PO) Take 1 tablet by mouth daily.    [provider]  Simethicone (GAS-X PO) Take 1 tablet by mouth daily at 2 PM.    [provider]       Allergies    Wellbutrin [bupropion], Amoxicillin, Clindamycin/lincomycin, Oxycodone, and Oxycodone-acetaminophen    Review of Systems   Review of Systems  Respiratory:  Positive for shortness of breath. Negative for cough.   Cardiovascular:  Negative for chest pain.    Physical Exam Updated Vital Signs BP (!) 141/81 (BP Location: Right Arm)   Pulse 83   Temp 98.3 F (36.8 C) (Oral)   Resp 18   Ht 5\' 2"  (1.575 m)   Wt 111.5 kg   LMP 08/08/2022 (Exact Date)   SpO2 97%   BMI 44.96 kg/m  Physical Exam Vitals and nursing note reviewed.  Constitutional:      General: She is not in acute distress.    Appearance: She is well-developed.  HENT:     Head: Normocephalic and atraumatic.  Eyes:     Conjunctiva/sclera: Conjunctivae normal.  Cardiovascular:     Rate and Rhythm: Normal rate and regular rhythm.     Heart sounds: No murmur heard. Pulmonary:     Effort: Pulmonary effort is normal. No respiratory distress.     Breath sounds: Normal breath sounds.  Abdominal:     Palpations: Abdomen is soft.     Tenderness: There is no abdominal tenderness.  Musculoskeletal:        General: No swelling.  Cervical back: Neck supple.     Right lower leg: 1+ Pitting Edema present.     Left lower leg: 1+ Pitting Edema present.  Skin:    General: Skin is warm and dry.     Capillary Refill: Capillary refill takes less than 2 seconds.  Neurological:     Mental Status: She is alert.  Psychiatric:        Mood and Affect: Mood normal.     ED Results / Procedures / Treatments   Labs (all labs ordered are listed, but only abnormal results are displayed) Labs Reviewed  CBC WITH DIFFERENTIAL/PLATELET - Abnormal; Notable for the following components:      Result Value   RBC 5.25 (*)    MCH 25.0 (*)    All other components within normal limits  COMPREHENSIVE METABOLIC PANEL - Abnormal; Notable for the following components:   CO2 21 (*)    Glucose, Bld 132 (*)    Calcium 8.8 (*)     All other components within normal limits  BRAIN NATRIURETIC PEPTIDE  HCG, SERUM, QUALITATIVE  I-STAT CHEM 8, ED  I-STAT BETA HCG BLOOD, ED ([facility], [facility], [facility] ONLY)  TROPONIN I (HIGH SENSITIVITY)  TROPONIN I (HIGH SENSITIVITY)    EKG EKG Interpretation  Date/Time:  Saturday August 16 2022 12:28:13 EDT Ventricular Rate:  115 PR Interval:  156 QRS Duration: 70 QT Interval:  298 QTC Calculation: 412 R Axis:   16 Text Interpretation: Sinus tachycardia Cannot rule out Anterior infarct , age undetermined Abnormal ECG No previous ECGs available Confirmed by Vonita Moss [phone]) on 08/16/2022 12:32:50 PM  Radiology CT Angio Chest PE W and/or Wo Contrast  Result Date: 08/16/2022 CLINICAL DATA:  Positive D-dimer EXAM: CT ANGIOGRAPHY CHEST WITH CONTRAST TECHNIQUE: Multidetector CT imaging of the chest was performed using the standard protocol during bolus administration of intravenous contrast. Multiplanar CT image reconstructions and MIPs were obtained to evaluate the vascular anatomy. RADIATION DOSE REDUCTION: This exam was performed according to the departmental dose-optimization program which includes automated exposure control, adjustment of the mA and/or kV according to patient size and/or use of iterative reconstruction technique. CONTRAST:  75mL OMNIPAQUE IOHEXOL 350 MG/ML SOLN COMPARISON:  None Available. FINDINGS: Cardiovascular: Thoracic aorta shows no aneurysmal dilatation or dissection. Multiple filling defects are noted within the pulmonary artery particularly in the lower lobes bilaterally consistent with pulmonary emboli. No right heart strain is noted. Mediastinum/Nodes: Thoracic inlet is within normal limits. No hilar or mediastinal adenopathy is noted. The esophagus as visualized is within normal limits. Lungs/Pleura: Lungs are well aerated bilaterally. No focal infiltrate or effusion is seen. Upper Abdomen: Visualized upper abdomen is within normal limits. Musculoskeletal: Degenerative  changes of the thoracic spine are noted. No acute rib abnormality is noted. Review of the MIP images confirms the above findings. IMPRESSION: Bilateral pulmonary emboli predominately within the lower lobes. No right heart strain is noted. Critical Value/emergent results were called by telephone at the time of interpretation on 08/16/2022 at 3:18 pm to Dr. Rodena Medin , who verbally acknowledged these results. Electronically Signed   By: Alcide Clever M.D.   On: 08/16/2022 15:18   VAS [place] LOWER EXTREMITY VENOUS (DVT) (ONLY [facility] & [facility])  Result Date: 08/16/2022  Lower Venous DVT Study Patient Name:  Ariana Mcdaniel  Date of Exam:   08/16/2022 Medical Rec #: [id number]      Accession #:    [id number] Date of Birth: 10-09-1975     Patient Gender: F Patient Age:  46 years Exam Location:  [facility] Procedure:      VAS [place] LOWER EXTREMITY VENOUS (DVT) Referring Phys: Junnie Loschiavo --------------------------------------------------------------------------------  Indications: SOB, and Elevated D-Dimer.  Risk Factors: DVT -2018. Comparison Study: Previous LLE 04/15/19 negative.                   Previous LLE 01/14/17 positive in popliteal and posterior                   tibial veins. Performing Technologist: McKayla Maag RVT, VT  Examination Guidelines: A complete evaluation includes B-mode imaging, spectral Doppler, color Doppler, and power Doppler as needed of all accessible portions of each vessel. Bilateral testing is considered an integral part of a complete examination. Limited examinations for reoccurring indications may be performed as noted. The reflux portion of the exam is performed with the patient in reverse Trendelenburg.  +---------+---------------+---------+-----------+----------+--------------+ RIGHT    CompressibilityPhasicitySpontaneityPropertiesThrombus Aging +---------+---------------+---------+-----------+----------+--------------+ CFV      Full           Yes      Yes                                  +---------+---------------+---------+-----------+----------+--------------+ SFJ      Full                                                        +---------+---------------+---------+-----------+----------+--------------+ FV Prox  Full                                                        +---------+---------------+---------+-----------+----------+--------------+ FV Mid   Full                                                        +---------+---------------+---------+-----------+----------+--------------+ FV DistalFull                                                        +---------+---------------+---------+-----------+----------+--------------+ PFV      Full                                                        +---------+---------------+---------+-----------+----------+--------------+ POP      Full           Yes      Yes                                 +---------+---------------+---------+-----------+----------+--------------+ PTV      Full                                                        +---------+---------------+---------+-----------+----------+--------------+  PERO     Full                                                        +---------+---------------+---------+-----------+----------+--------------+   +---------+---------------+---------+-----------+----------+--------------+ LEFT     CompressibilityPhasicitySpontaneityPropertiesThrombus Aging +---------+---------------+---------+-----------+----------+--------------+ CFV      Full           Yes      Yes                                 +---------+---------------+---------+-----------+----------+--------------+ SFJ      Full                                                        +---------+---------------+---------+-----------+----------+--------------+ FV Prox  Full                                                         +---------+---------------+---------+-----------+----------+--------------+ FV Mid   Full                                                        +---------+---------------+---------+-----------+----------+--------------+ FV DistalFull                                                        +---------+---------------+---------+-----------+----------+--------------+ PFV      Full                                                        +---------+---------------+---------+-----------+----------+--------------+ POP      Full           Yes      Yes                                 +---------+---------------+---------+-----------+----------+--------------+ PTV      Full                                                        +---------+---------------+---------+-----------+----------+--------------+ PERO     Full                                                        +---------+---------------+---------+-----------+----------+--------------+  Summary: BILATERAL: - No evidence of deep vein thrombosis seen in the lower extremities, bilaterally. - No evidence of superficial venous thrombosis in the lower extremities, bilaterally. -No evidence of popliteal cyst, bilaterally.   *See table(s) above for measurements and observations.    Preliminary     Procedures Procedures    Medications Ordered in ED Medications  apixaban (ELIQUIS) tablet 10 mg (10 mg Oral Given 08/16/22 1704)    Followed by  apixaban (ELIQUIS) tablet 5 mg (has no administration in time range)  iohexol (OMNIPAQUE) 350 MG/ML injection 75 mL (75 mLs Intravenous Contrast Given 08/16/22 1458)    ED Course/ Medical Decision Making/ A&P                             Medical Decision Making Patient is a 47 year old female, here for elevated D-dimer, and further evaluation for her shortness of breath it has been going on for the last 2 weeks.  Given that she has had an elevated D-dimer outpatient, we will obtain a  CTA, and blood work including troponins for further evaluation.  She has no signs of rash, her lung sounds are clear at this time, and she denies any chest pain.  Her legs are 1+ bilateral pitting edema thus we will obtain DVT ultrasound.  Amount and/or Complexity of Data Reviewed Labs:     Details: Normal troponin Radiology:     Details: CTA shows bilateral pulmonary embolisms without evidence of heart strain ECG/medicine tests:     Details: Sinus tachycardia Discussion of management or test interpretation with external provider(s): Discussed with patient, she has a bilateral pulmonary embolisms without evidence of heart strain, discussed with Dr. Rodena Medin, initially was going to place on heparin drip, discussed with Dr. Katrinka Blazing, hospitalist, he recommends Eliquis and outpatient, he will further evaluate patient.  See his note for further details, he recommends that patient is discharged home, as long as O2 saturations when ambulating, are okay and have her follow-up with as hematologist for further evaluation.  Walking sats, 93% while ambulating, no difficulty with ambulating, we will have her follow-up with hematology, provided with information, and was given a starter pack for Eliquis, and pharmacy discussed with patient.  Risk Prescription drug management.    Final Clinical Impression(s) / ED Diagnoses Final diagnoses:  Multiple subsegmental pulmonary emboli without acute cor pulmonale (HCC)    Rx / DC Orders ED Discharge Orders          Ordered    APIXABAN (ELIQUIS) VTE STARTER PACK (10MG  AND 5MG )       Note to Pharmacy: If starter pack unavailable, substitute with seventy-four 5 mg apixaban tabs following the above SIG directions.   08/16/22 1726              Kennedie Pardoe, [place], PA 08/16/22 1833    Wynetta Fines, MD 08/16/22 2036

## 2022-08-16 NOTE — ED Triage Notes (Signed)
Pt c/o sob since memorial day; bit by tick same day, had rash; had blood drawn yesterday at follow up appointment, D Dimer elevated; hx DVT; not on thinners; denies cp; sob worse with exertion

## 2022-08-16 NOTE — Progress Notes (Addendum)
ANTICOAGULATION CONSULT NOTE - Initial Consult  Pharmacy Consult for heparin  Indication: pulmonary embolus  Allergies  Allergen Reactions   Wellbutrin [Bupropion] Hives   Amoxicillin     itching   Clindamycin/Lincomycin     Caused a superinfection   Oxycodone Hives   Oxycodone-Acetaminophen Hives    Patient Measurements:   Heparin Dosing Weight: 77.3kg   Vital Signs: Temp: 98.1 F (36.7 C) (06/08 1226) BP: 140/76 (06/08 1315) Pulse Rate: 97 (06/08 1315)  Labs: Recent Labs    08/15/22 0926 08/16/22 1235  HGB 12.8 13.1  HCT 39.5 42.4  PLT 294.0 331  CREATININE  --  0.77  TROPONINIHS  --  4    Estimated Creatinine Clearance: 105.8 mL/min (by C-G formula based on SCr of 0.77 mg/dL).   Medical History: Past Medical History:  Diagnosis Date   Allergic rhinitis due to pollen    DVT (deep venous thrombosis) (HCC) 01/2017   Fatty liver    GERD (gastroesophageal reflux disease)    Mastodynia    Palpitations    PONV (postoperative nausea and vomiting)    Renal stones    Assessment: Patient with CC of SOB since Memorial Day. Does have a hx of DVT but currently not on any blood thinners. HgB 13.1 and PLT 331. CTA showing bilateral PE w/o right heart strain, patient currently on room air. Pharmacy consulted to dose heparin.  Goal of Therapy:  Heparin level 0.3-0.7 units/ml Monitor platelets by anticoagulation protocol: Yes   Plan:  Give 4500 units bolus x 1 Start heparin infusion at 1250 units/hr Check anti-Xa level in 6 hours and daily while on heparin Continue to monitor H&H and platelets  Estill Batten, PharmD, BCCCP  08/16/2022,3:24 PM  Addendum:   Pharmacy consulted for Eliqius. Starting Eliquis 10mg  BID x 7 days followed by Eliquis 5mg  BID.   Estill Batten, PharmD, BCCCP

## 2022-08-18 ENCOUNTER — Telehealth: Payer: Self-pay

## 2022-08-18 NOTE — Telephone Encounter (Signed)
Patient has been scheduled

## 2022-08-18 NOTE — Telephone Encounter (Signed)
Per chart review tab pt was seen [place] on 08/16/22. Sending note to Dr Alphonsus Sias and Alphonsus Sias pool.

## 2022-08-18 NOTE — Transitions of Care (Post Inpatient/ED Visit) (Unsigned)
I spoke with pt who was seen [facility] ED on 08/16/22 with bilateral pulmonary emboli.pt feels about the same; SOB upon exertion or if talks too much; no CP or tightness in chest. Pt is aware how to take meds and pt does have support at home with her husband if needed.pt already has ED FU with Dr Alphonsus Sias on 08/22/22 and offered sooner appt but pt declined due to work schedule.advised pt to take med, rest and drink fliuds to stay hydrated. UC & ED precautions given and pt voiced understanding. Sending note to Dr Alphonsus Sias.   08/18/2022  Name: Ariana Mcdaniel MRN: [id number] DOB: Dec 18, 1975  Today's TOC FU Call Status: Today's TOC FU Call Status:: Successful TOC FU Call Competed TOC FU Call Complete Date: 08/18/22  Transition Care Management Follow-up Telephone Call Date of Discharge: 08/16/22 Discharge Facility: [facility]) Type of Discharge: Emergency Department Reason for ED Visit: Respiratory, Other: (pt has bilateral pulmonary emboli) How have you been since you were released from the hospital?: Same (pt feels about the same; SOB upon exertion or if talks too much; no CP or tightness in chest.) Any questions or concerns?: No  Items Reviewed: Did you receive and understand the discharge instructions provided?: Yes Medications obtained,verified, and reconciled?: Yes (Medications Reviewed) (Reviewed all pts meds and pt is newly taking Eliquis 5 mg taking two tabs bid for 7 days and on 8th day and after take one tab bid.) Any new allergies since your discharge?: No Dietary orders reviewed?: NA Do you have support at home?: Yes People in Home: spouse Name of Support/Comfort Primary Source: Greig Castilla  Medications Reviewed Today: Medications Reviewed Today     Reviewed by Doreene Nest, NP (Nurse Practitioner) on 08/15/22 at [phone]  Med List Status: <None>   Medication Order Taking? Sig Documenting Provider Last Dose Status Informant  cetirizine (ZYRTEC) 5 MG tablet [id number] Yes Take 5 mg by  mouth daily. [provider] Taking Active   doxycycline (VIBRA-TABS) 100 MG tablet [id number] Yes Take 1 tablet (100 mg total) by mouth 2 (two) times daily. Karie Schwalbe, MD Taking Active   famotidine (PEPCID) 10 MG tablet [id number] Yes Take 10 mg by mouth daily as needed. [provider] Taking Active   fluconazole (DIFLUCAN) 150 MG tablet [id number] No Take 1 tablet (150 mg total) by mouth once a week.  Patient not taking: Reported on 08/15/2022   Karie Schwalbe, MD Not Taking Active   omeprazole (PRILOSEC OTC) 20 MG tablet [id number] Yes Take 20 mg by mouth daily. [provider] Taking Active   Probiotic Product (PROBIOTIC DAILY PO) [id number] Yes Take 1 tablet by mouth daily. [provider] Taking Active   Simethicone (GAS-X PO) [id number] Yes Take 1 tablet by mouth daily at 2 PM. [provider] Taking Active             Home Care and Equipment/Supplies: Were Home Health Services Ordered?: NA Any new equipment or medical supplies ordered?: NA  Functional Questionnaire: Do you need assistance with bathing/showering or dressing?: No Do you need assistance with meal preparation?: No Do you need assistance with eating?: No Do you have difficulty maintaining continence: No Do you need assistance with getting out of bed/getting out of a chair/moving?: No Do you have difficulty managing or taking your medications?: No  Follow up appointments reviewed: PCP Follow-up appointment confirmed?: Yes Date of PCP follow-up appointment?: 08/22/22 (offered pt sooner appt but pt declined due to work sachedule.) Follow-up Provider:  Dr Tillman Abide Specialist [facility] Follow-up appointment confirmed?: No Reason Specialist Follow-Up Not Confirmed: Patient has Specialist Provider Number and will Call for Appointment (pt has [phone] to call and schedule ED FU.) Do you need transportation to your follow-up appointment?: No Do you understand care  options if your condition(s) worsen?: Yes-patient verbalized understanding    SIGNATURE Lewanda Rife, LPN

## 2022-08-22 ENCOUNTER — Ambulatory Visit: Payer: BC Managed Care – PPO | Admitting: Internal Medicine

## 2022-08-22 ENCOUNTER — Encounter: Payer: Self-pay | Admitting: Internal Medicine

## 2022-08-22 VITALS — BP 114/78 | HR 88 | Temp 97.9°F | Ht 62.0 in | Wt 249.0 lb

## 2022-08-22 DIAGNOSIS — I2699 Other pulmonary embolism without acute cor pulmonale: Secondary | ICD-10-CM | POA: Diagnosis not present

## 2022-08-22 MED ORDER — APIXABAN 5 MG PO TABS
5.0000 mg | ORAL_TABLET | Freq: Two times a day (BID) | ORAL | 3 refills | Status: DC
Start: 2022-08-22 — End: 2023-04-29

## 2022-08-22 NOTE — Progress Notes (Signed)
Subjective:    Patient ID: Ariana Mcdaniel, female    DOB: September 25, 1975, 47 y.o.   MRN: [id number]  HPI Here for ER follow up---after diagnosed with pulmonary embolus  Had seemed to be improving from Lyme disease Then SOB worsened Seen here and d-dimer elevated Sent to ER---- multiple pulmonary emboli on CT angio Started on eliquis starter pack after no hypoxia and stable in ER CT showed no right heart strain No DVT on leg studies  Feels like she has improved some--not quite as short of breath May have brief chest pressure when exerting--not striking (could be fitness) No leg swelling or pain  Current Outpatient Medications on File Prior to Visit  Medication Sig Dispense Refill   APIXABAN (ELIQUIS) VTE STARTER PACK (10MG  AND 5MG ) Take as directed on package: start with two-5mg  tablets twice daily for 7 days. On day 8, switch to one-5mg  tablet twice daily. 74 each 0   cetirizine (ZYRTEC) 5 MG tablet Take 5 mg by mouth daily.     famotidine (PEPCID) 10 MG tablet Take 10 mg by mouth daily as needed.     omeprazole (PRILOSEC OTC) 20 MG tablet Take 20 mg by mouth daily.     Probiotic Product (PROBIOTIC DAILY PO) Take 1 tablet by mouth daily.     Simethicone (GAS-X PO) Take 1 tablet by mouth daily at 2 PM.     No current facility-administered medications on file prior to visit.    Allergies  Allergen Reactions   Wellbutrin [Bupropion] Hives   Amoxicillin     itching   Clindamycin/Lincomycin     Caused a superinfection   Oxycodone Hives   Oxycodone-Acetaminophen Hives    Past Medical History:  Diagnosis Date   Allergic rhinitis due to pollen    DVT (deep venous thrombosis) (HCC) 01/2017   Fatty liver    GERD (gastroesophageal reflux disease)    Mastodynia    Palpitations    PONV (postoperative nausea and vomiting)    Renal stones     Past Surgical History:  Procedure Laterality Date   BREAST BIOPSY Bilateral    BREAST EXCISIONAL BIOPSY Left 04/2020   BREAST  LUMPECTOMY WITH RADIOACTIVE SEED LOCALIZATION Left 04/25/2020   Procedure: LEFT BREAST LUMPECTOMY WITH RADIOACTIVE SEED LOCALIZATION;  Surgeon: Harriette Bouillon, MD;  Location: [facility];  Service: General;  Laterality: Left;   TONSILLECTOMY     WISDOM TOOTH EXTRACTION      Family History  Problem Relation Age of Onset   Thyroid disease Mother    High blood pressure Father    High Cholesterol Father    Celiac disease Sister    Celiac disease Sister    Hypertension Maternal Grandmother    Diabetes Maternal Grandmother    Stroke Maternal Grandmother    Depression Paternal Grandmother    Cancer Paternal Grandfather        non-hodgkin's lymphoma   Heart disease Neg Hx    Colon cancer Neg Hx    Esophageal cancer Neg Hx    Pancreatic cancer Neg Hx    Stomach cancer Neg Hx     Social History   Socioeconomic History   Marital status: Married    Spouse name: Greig Castilla   Number of children: 2   Years of education: Not on file   Highest education level: Not on file  Occupational History   Occupation: Surveyor, quantity    Employer: [employer]  Tobacco Use   Smoking status: Former  Types: Cigarettes    Quit date: 01/04/2010    Years since quitting: 12.6   Smokeless tobacco: Never  Vaping Use   Vaping Use: Never used  Substance and Sexual Activity   Alcohol use: Not Currently    Comment: 3-4 beers per week   Drug use: No   Sexual activity: Yes    Partners: Male    Birth control/protection: None    Comment: husband had Vasectomy  Other Topics Concern   Not on file  Social History Narrative   2nd marriage   Social Determinants of Health   Financial Resource Strain: Not on file  Food Insecurity: Not on file  Transportation Needs: Not on file  Physical Activity: Not on file  Stress: Not on file  Social Connections: Not on file  Intimate Partner Violence: Not on file   Review of Systems No N/V Appetite is okay Normal sleep issues for her DVT 5  years ago was after an injury Buttock cellulitis has resolved---some photosensitivity rash since then (doxy)     Objective:   Physical Exam Constitutional:      Appearance: Normal appearance.  Cardiovascular:     Rate and Rhythm: Normal rate and regular rhythm.     Heart sounds: No murmur heard.    No gallop.  Pulmonary:     Effort: Pulmonary effort is normal.     Breath sounds: Normal breath sounds. No wheezing or rales.  Musculoskeletal:     Cervical back: Neck supple.     Right lower leg: No edema.     Left lower leg: No edema.     Comments: No calf tenderness  Lymphadenopathy:     Cervical: No cervical adenopathy.  Neurological:     Mental Status: She is alert.  Psychiatric:        Mood and Affect: Mood normal.        Behavior: Behavior normal.            Assessment & Plan:

## 2022-08-22 NOTE — Assessment & Plan Note (Signed)
Apparently unprovoked multiple pulmonary emboli on CT angio No DVT Past provoked DVT with normal hypercoag work up (other than elevated Factor VIII Now on eliquis starter Will go to hematology--but expect she should stay on the eliquis indefinitely (Rx given) Normal follow up with me

## 2022-09-05 ENCOUNTER — Other Ambulatory Visit: Payer: Self-pay

## 2022-09-05 ENCOUNTER — Inpatient Hospital Stay: Payer: BC Managed Care – PPO

## 2022-09-05 ENCOUNTER — Inpatient Hospital Stay: Payer: BC Managed Care – PPO | Attending: Hematology and Oncology | Admitting: Hematology and Oncology

## 2022-09-05 VITALS — BP 158/95 | HR 92 | Temp 97.7°F | Resp 18 | Ht 62.0 in | Wt 248.8 lb

## 2022-09-05 DIAGNOSIS — Z7901 Long term (current) use of anticoagulants: Secondary | ICD-10-CM | POA: Insufficient documentation

## 2022-09-05 DIAGNOSIS — Z86718 Personal history of other venous thrombosis and embolism: Secondary | ICD-10-CM | POA: Diagnosis not present

## 2022-09-05 DIAGNOSIS — Z87891 Personal history of nicotine dependence: Secondary | ICD-10-CM | POA: Diagnosis not present

## 2022-09-05 DIAGNOSIS — N939 Abnormal uterine and vaginal bleeding, unspecified: Secondary | ICD-10-CM | POA: Insufficient documentation

## 2022-09-05 DIAGNOSIS — I2699 Other pulmonary embolism without acute cor pulmonale: Secondary | ICD-10-CM | POA: Diagnosis present

## 2022-09-05 NOTE — Assessment & Plan Note (Addendum)
01/14/2017: Acute DVT left popliteal vein and posterior tibial vein (caused by being in a boot for leg injury) 08/16/2022: CT angiogram: Bilateral pulmonary emboli predominantly lower lobes  Chronic/recurrent blood clots I discussed with the patient risk factors for blood clots.  Inherited risk factors previously tested were negative except for elevated factor VIII  Acquired risk factors include: 1. Antiphospholipid antibody syndrome 2. Weight 3.  Progesterone hormone therapy     Workup recommended: Because we need to test antipossible antibodies while she is not blood thinners, I recommended that we obtain this test in 2 months and she will hold off on blood thinners for 48 hours prior to the blood test.  I will call her a few days later to discuss the results of this test.  If she is positive then we would need to recheck it again in 3 months and if it is repeatedly positive then she will be on blood thinners indefinitely.  If the test is negative then she will be treated with anticoagulation for 6 months. If she has another blood clot then she will be on indefinite lifelong anticoagulation.

## 2022-09-05 NOTE — Progress Notes (Signed)
[facility] CONSULT NOTE  Patient Care Team: Karie Schwalbe, MD as PCP - General (Pediatrics)  CHIEF COMPLAINTS/PURPOSE OF CONSULTATION:  Recurrent blood clots  HISTORY OF PRESENTING ILLNESS:  Ariana Mcdaniel 47 y.o. female is here because of recent history of bilateral pulmonary emboli.  Patient was being treated for Lyme disease after tick bite and she was feeling extremely shortness of breath which was evaluated initially by D-dimer and then a CT of the chest which revealed pulmonary emboli.  She was discharged home on Eliquis.  She is tolerating Eliquis fairly well.  She has major problems with heavy uterine bleeding and she has been on progesterone for that reason.  There has not been any predisposing factors for this blood clots.  In 2018 when she had the DVT in the left leg she was in a boot after an injury of the leg.  So that was caused by the immobility at that time.  But this current episode of blood clots is not precipitated by anything that she did recently. Her sister had blood clots but that was felt to be related to an anatomical bone compression of the veins in her neck.  I reviewed her records extensively and collaborated the history with the patient.   MEDICAL HISTORY:  Past Medical History:  Diagnosis Date   Allergic rhinitis due to pollen    DVT (deep venous thrombosis) (HCC) 01/2017   Fatty liver    GERD (gastroesophageal reflux disease)    Mastodynia    Palpitations    PONV (postoperative nausea and vomiting)    Renal stones     SURGICAL HISTORY: Past Surgical History:  Procedure Laterality Date   BREAST BIOPSY Bilateral    BREAST EXCISIONAL BIOPSY Left 04/2020   BREAST LUMPECTOMY WITH RADIOACTIVE SEED LOCALIZATION Left 04/25/2020   Procedure: LEFT BREAST LUMPECTOMY WITH RADIOACTIVE SEED LOCALIZATION;  Surgeon: Harriette Bouillon, MD;  Location: [facility];  Service: General;  Laterality: Left;   TONSILLECTOMY     WISDOM TOOTH  EXTRACTION      SOCIAL HISTORY: Social History   Socioeconomic History   Marital status: Married    Spouse name: Greig Castilla   Number of children: 2   Years of education: Not on file   Highest education level: Not on file  Occupational History   Occupation: Courtroom Database administrator: [employer]  Tobacco Use   Smoking status: Former    Types: Cigarettes    Quit date: 01/04/2010    Years since quitting: 12.6   Smokeless tobacco: Never  Vaping Use   Vaping Use: Never used  Substance and Sexual Activity   Alcohol use: Not Currently    Comment: 3-4 beers per week   Drug use: No   Sexual activity: Yes    Partners: Male    Birth control/protection: None    Comment: husband had Vasectomy  Other Topics Concern   Not on file  Social History Narrative   2nd marriage   Social Determinants of Health   Financial Resource Strain: Not on file  Food Insecurity: Not on file  Transportation Needs: Not on file  Physical Activity: Not on file  Stress: Not on file  Social Connections: Not on file  Intimate Partner Violence: Not on file    FAMILY HISTORY: Family History  Problem Relation Age of Onset   Thyroid disease Mother    High blood pressure Father    High Cholesterol Father    Celiac disease  Sister    Celiac disease Sister    Hypertension Maternal Grandmother    Diabetes Maternal Grandmother    Stroke Maternal Grandmother    Depression Paternal Grandmother    Cancer Paternal Grandfather        non-hodgkin's lymphoma   Heart disease Neg Hx    Colon cancer Neg Hx    Esophageal cancer Neg Hx    Pancreatic cancer Neg Hx    Stomach cancer Neg Hx     ALLERGIES:  is allergic to wellbutrin [bupropion], amoxicillin, clindamycin/lincomycin, oxycodone, and oxycodone-acetaminophen.  MEDICATIONS:  Current Outpatient Medications  Medication Sig Dispense Refill   apixaban (ELIQUIS) 5 MG TABS tablet Take 1 tablet (5 mg total) by mouth 2 (two) times daily. 180  tablet 3   cetirizine (ZYRTEC) 5 MG tablet Take 5 mg by mouth daily.     famotidine (PEPCID) 10 MG tablet Take 10 mg by mouth daily as needed.     omeprazole (PRILOSEC OTC) 20 MG tablet Take 20 mg by mouth daily.     Probiotic Product (PROBIOTIC DAILY PO) Take 1 tablet by mouth daily.     Simethicone (GAS-X PO) Take 1 tablet by mouth daily at 2 PM.     No current facility-administered medications for this visit.    REVIEW OF SYSTEMS:   Constitutional: Denies fevers, chills or abnormal night sweats All other systems were reviewed with the patient and are negative.  PHYSICAL EXAMINATION: ECOG PERFORMANCE STATUS: 0 - Asymptomatic  Vitals:   09/05/22 1154  BP: (!) 158/95  Pulse: 92  Resp: 18  Temp: 97.7 F (36.5 C)  SpO2: 97%   Filed Weights   09/05/22 1154  Weight: 248 lb 12.8 oz (112.9 kg)    GENERAL:alert, no distress and comfortable  LABORATORY DATA:  I have reviewed the data as listed Lab Results  Component Value Date   WBC 10.2 08/16/2022   HGB 13.1 08/16/2022   HCT 42.4 08/16/2022   MCV 80.8 08/16/2022   PLT 331 08/16/2022   Lab Results  Component Value Date   NA 138 08/16/2022   K 3.8 08/16/2022   CL 104 08/16/2022   CO2 21 (L) 08/16/2022    RADIOGRAPHIC STUDIES: I have personally reviewed the radiological reports and agreed with the findings in the report.  ASSESSMENT AND PLAN:  Multiple pulmonary emboli (HCC) 01/14/2017: Acute DVT left popliteal vein and posterior tibial vein (caused by being in a boot for leg injury) 08/16/2022: CT angiogram: Bilateral pulmonary emboli predominantly lower lobes  Chronic/recurrent blood clots I discussed with the patient risk factors for blood clots.  Inherited risk factors previously tested were negative except for elevated factor VIII  Acquired risk factors include: 1. Antiphospholipid antibody syndrome 2. Weight 3.  Progesterone hormone therapy     Workup recommended: Because we need to test antipossible  antibodies while she is not blood thinners, I recommended that we obtain this test in 2 months and she will hold off on blood thinners for 48 hours prior to the blood test.  I will call her a few days later to discuss the results of this test.  If she is positive then we would need to recheck it again in 3 months and if it is repeatedly positive then she will be on blood thinners indefinitely.  If the test is negative then she will be treated with anticoagulation for 6 months. If she has another blood clot then she will be on indefinite lifelong anticoagulation.   All  questions were answered. The patient knows to call the clinic with any problems, questions or concerns.    Tamsen Meek, MD 09/05/22

## 2022-09-29 ENCOUNTER — Ambulatory Visit: Payer: BC Managed Care – PPO | Admitting: Internal Medicine

## 2022-09-29 ENCOUNTER — Encounter: Payer: Self-pay | Admitting: Internal Medicine

## 2022-09-29 VITALS — BP 114/80 | HR 96 | Temp 97.6°F | Ht 62.0 in | Wt 248.0 lb

## 2022-09-29 DIAGNOSIS — J0110 Acute frontal sinusitis, unspecified: Secondary | ICD-10-CM | POA: Diagnosis not present

## 2022-09-29 MED ORDER — DOXYCYCLINE HYCLATE 100 MG PO TABS
100.0000 mg | ORAL_TABLET | Freq: Two times a day (BID) | ORAL | 0 refills | Status: DC
Start: 2022-09-29 — End: 2023-04-29

## 2022-09-29 NOTE — Assessment & Plan Note (Signed)
Seems to be an isolated sinus infection---not a general viral URI Discussed analgesics Doxy 100 bid x 7 days

## 2022-09-29 NOTE — Progress Notes (Signed)
Subjective:    Patient ID: Ariana Mcdaniel, female    DOB: Aug 14, 1975, 47 y.o.   MRN: [id number]  HPI Here due to respiratory infection  Breathing had been improving slowly Some trouble with the humidity  Started with illness 2 days ago Left side head pain and left ear "is killing me" Congested and a ton of post nasal drip No fever No change in breathing No cough  Tylenol sinus/tylenol--not really helping  Current Outpatient Medications on File Prior to Visit  Medication Sig Dispense Refill   apixaban (ELIQUIS) 5 MG TABS tablet Take 1 tablet (5 mg total) by mouth 2 (two) times daily. 180 tablet 3   cetirizine (ZYRTEC) 5 MG tablet Take 5 mg by mouth daily.     famotidine (PEPCID) 10 MG tablet Take 10 mg by mouth daily as needed.     omeprazole (PRILOSEC OTC) 20 MG tablet Take 20 mg by mouth daily.     Probiotic Product (PROBIOTIC DAILY PO) Take 1 tablet by mouth daily.     Simethicone (GAS-X PO) Take 1 tablet by mouth daily at 2 PM.     No current facility-administered medications on file prior to visit.    Allergies  Allergen Reactions   Wellbutrin [Bupropion] Hives   Amoxicillin     itching   Clindamycin/Lincomycin     Caused a superinfection   Oxycodone Hives   Oxycodone-Acetaminophen Hives    Past Medical History:  Diagnosis Date   Allergic rhinitis due to pollen    DVT (deep venous thrombosis) (HCC) 01/2017   Fatty liver    GERD (gastroesophageal reflux disease)    Mastodynia    Palpitations    PONV (postoperative nausea and vomiting)    Renal stones     Past Surgical History:  Procedure Laterality Date   BREAST BIOPSY Bilateral    BREAST EXCISIONAL BIOPSY Left 04/2020   BREAST LUMPECTOMY WITH RADIOACTIVE SEED LOCALIZATION Left 04/25/2020   Procedure: LEFT BREAST LUMPECTOMY WITH RADIOACTIVE SEED LOCALIZATION;  Surgeon: Harriette Bouillon, MD;  Location: [facility];  Service: General;  Laterality: Left;   TONSILLECTOMY     WISDOM TOOTH  EXTRACTION      Family History  Problem Relation Age of Onset   Thyroid disease Mother    High blood pressure Father    High Cholesterol Father    Celiac disease Sister    Celiac disease Sister    Hypertension Maternal Grandmother    Diabetes Maternal Grandmother    Stroke Maternal Grandmother    Depression Paternal Grandmother    Cancer Paternal Grandfather        non-hodgkin's lymphoma   Heart disease Neg Hx    Colon cancer Neg Hx    Esophageal cancer Neg Hx    Pancreatic cancer Neg Hx    Stomach cancer Neg Hx     Social History   Socioeconomic History   Marital status: Married    Spouse name: Greig Castilla   Number of children: 2   Years of education: Not on file   Highest education level: Not on file  Occupational History   Occupation: Courtroom clerk--supervisor    Employer: [employer]  Tobacco Use   Smoking status: Former    Current packs/day: 0.00    Types: Cigarettes    Quit date: 01/04/2010    Years since quitting: 12.7   Smokeless tobacco: Never  Vaping Use   Vaping status: Never Used  Substance and Sexual Activity   Alcohol use:  Not Currently    Comment: 3-4 beers per week   Drug use: No   Sexual activity: Yes    Partners: Male    Birth control/protection: None    Comment: husband had Vasectomy  Other Topics Concern   Not on file  Social History Narrative   2nd marriage   Social Determinants of Health   Financial Resource Strain: Not on file  Food Insecurity: Not on file  Transportation Needs: Not on file  Physical Activity: Not on file  Stress: Not on file  Social Connections: Not on file  Intimate Partner Violence: Not on file   Review of Systems No N/V Some tinnitus 2 days ago No hearing loss Eating okay    Objective:   Physical Exam Constitutional:      Appearance: Normal appearance.  HENT:     Head:     Comments: Left frontal tenderness    Right Ear: Tympanic membrane and ear canal normal.     Left Ear: Tympanic membrane  and ear canal normal.     Mouth/Throat:     Pharynx: No oropharyngeal exudate or posterior oropharyngeal erythema.  Pulmonary:     Effort: Pulmonary effort is normal.     Breath sounds: Normal breath sounds. No wheezing or rales.  Musculoskeletal:     Cervical back: Neck supple.  Lymphadenopathy:     Cervical: No cervical adenopathy.  Neurological:     Mental Status: She is alert.            Assessment & Plan:

## 2022-10-24 ENCOUNTER — Telehealth: Payer: Self-pay | Admitting: Internal Medicine

## 2022-10-24 NOTE — Telephone Encounter (Signed)
Called patient states that she got home last night and left ankle is selling. Swelling is into knee area. Denies any redness, pain or warmth. She propped up last night but that normally helps but did not help today. No changes in skin color at all. She is taking Eliquis 5 mg bid. She did miss 1 dose twice this week. She has history of DVT. She has not had any increased respiratory symptoms. Advised patient she should not wait until next week to be seen. This needs to be evaluated. If it is blood clot it could become life threatening. Advised it is the recommendation of our office to be seen at ED as soon as possible. Patient understands but continues to decline ED evaluation. Agrees if increased over weekend will go. Will keep appointment that we have scheduled for Monday in event patient does not get see by ED. Reviewed all red words with patient and repeated back she is aware the importance of symptoms.   Sending message to Jae Dire to review. Dr. Alphonsus Sias is not in the office today.

## 2022-10-24 NOTE — Telephone Encounter (Signed)
Noted.  Follow-up with PCP as scheduled next week. ED precautions were provided.

## 2022-10-24 NOTE — Telephone Encounter (Signed)
FYI: This call has been transferred to Access Nurse. Once the result note has been entered staff can address the message at that time.  Patient called in with the following symptoms:  Red Word: swelling and tightness of L leg, patient says she was recently diagnosed with multiple pulmonary emboli and has had a DVT in her L leg before      Please advise at Mobile [phone] (mobile)  Message is routed to Provider Pool and [facility] Triage

## 2022-10-24 NOTE — Telephone Encounter (Signed)
Received call from Access nurse requesting patient be seen in 4 hours. Advised nothing open. Called back to office let us know that patient refused ED/UC wanted appointment for Monday with PCP. Set up with access nurse. Will call patient back once note from the received and review recommendations with patient.

## 2022-10-25 NOTE — Telephone Encounter (Signed)
Okay--I will see her on Monday

## 2022-10-27 ENCOUNTER — Ambulatory Visit: Payer: BC Managed Care – PPO | Admitting: Internal Medicine

## 2022-10-28 ENCOUNTER — Encounter: Payer: Self-pay | Admitting: Internal Medicine

## 2022-11-03 ENCOUNTER — Telehealth: Payer: Self-pay | Admitting: Hematology and Oncology

## 2022-11-03 ENCOUNTER — Other Ambulatory Visit: Payer: Self-pay | Admitting: Hematology and Oncology

## 2022-11-03 DIAGNOSIS — I2699 Other pulmonary embolism without acute cor pulmonale: Secondary | ICD-10-CM

## 2022-11-03 DIAGNOSIS — Z86718 Personal history of other venous thrombosis and embolism: Secondary | ICD-10-CM

## 2022-11-03 NOTE — Telephone Encounter (Signed)
Rescehduled appointment per provider PAL. Left voicemail with appointment details.

## 2022-11-04 ENCOUNTER — Inpatient Hospital Stay: Payer: BC Managed Care – PPO | Attending: Hematology and Oncology

## 2022-11-04 DIAGNOSIS — Z87891 Personal history of nicotine dependence: Secondary | ICD-10-CM | POA: Insufficient documentation

## 2022-11-04 DIAGNOSIS — Z7901 Long term (current) use of anticoagulants: Secondary | ICD-10-CM | POA: Diagnosis not present

## 2022-11-04 DIAGNOSIS — N939 Abnormal uterine and vaginal bleeding, unspecified: Secondary | ICD-10-CM | POA: Insufficient documentation

## 2022-11-04 DIAGNOSIS — I2699 Other pulmonary embolism without acute cor pulmonale: Secondary | ICD-10-CM | POA: Insufficient documentation

## 2022-11-04 DIAGNOSIS — Z86718 Personal history of other venous thrombosis and embolism: Secondary | ICD-10-CM | POA: Diagnosis not present

## 2022-11-04 LAB — CBC WITH DIFFERENTIAL (CANCER CENTER ONLY)
Abs Immature Granulocytes: 0.02 10*3/uL (ref 0.00–0.07)
Basophils Absolute: 0 10*3/uL (ref 0.0–0.1)
Basophils Relative: 0 %
Eosinophils Absolute: 0.2 10*3/uL (ref 0.0–0.5)
Eosinophils Relative: 2 %
HCT: 37.1 % (ref 36.0–46.0)
Hemoglobin: 11.6 g/dL — ABNORMAL LOW (ref 12.0–15.0)
Immature Granulocytes: 0 %
Lymphocytes Relative: 25 %
Lymphs Abs: 2 10*3/uL (ref 0.7–4.0)
MCH: 24.8 pg — ABNORMAL LOW (ref 26.0–34.0)
MCHC: 31.3 g/dL (ref 30.0–36.0)
MCV: 79.4 fL — ABNORMAL LOW (ref 80.0–100.0)
Monocytes Absolute: 0.4 10*3/uL (ref 0.1–1.0)
Monocytes Relative: 5 %
Neutro Abs: 5.4 10*3/uL (ref 1.7–7.7)
Neutrophils Relative %: 68 %
Platelet Count: 321 10*3/uL (ref 150–400)
RBC: 4.67 MIL/uL (ref 3.87–5.11)
RDW: 13.9 % (ref 11.5–15.5)
WBC Count: 8 10*3/uL (ref 4.0–10.5)
nRBC: 0 % (ref 0.0–0.2)

## 2022-11-04 LAB — CMP (CANCER CENTER ONLY)
ALT: 34 U/L (ref 0–44)
AST: 20 U/L (ref 15–41)
Albumin: 3.9 g/dL (ref 3.5–5.0)
Alkaline Phosphatase: 89 U/L (ref 38–126)
Anion gap: 7 (ref 5–15)
BUN: 11 mg/dL (ref 6–20)
CO2: 24 mmol/L (ref 22–32)
Calcium: 8.6 mg/dL — ABNORMAL LOW (ref 8.9–10.3)
Chloride: 106 mmol/L (ref 98–111)
Creatinine: 0.69 mg/dL (ref 0.44–1.00)
GFR, Estimated: >60 mL/min (ref >60)
Glucose, Bld: 117 mg/dL — ABNORMAL HIGH (ref 70–99)
Potassium: 4 mmol/L (ref 3.5–5.1)
Sodium: 137 mmol/L (ref 135–145)
Total Bilirubin: 0.5 mg/dL (ref 0.3–1.2)
Total Protein: 7 g/dL (ref 6.5–8.1)

## 2022-11-05 LAB — BETA-2-GLYCOPROTEIN I ABS, IGG/M/A
Beta-2 Glyco I IgG: <9 GPI IgG units (ref 0–20)
Beta-2-Glycoprotein I IgA: <9 GPI IgA units (ref 0–25)
Beta-2-Glycoprotein I IgM: <9 GPI IgM units (ref 0–32)

## 2022-11-05 LAB — CARDIOLIPIN ANTIBODIES, IGG, IGM, IGA
Anticardiolipin IgA: <9 U/mL (ref 0–11)
Anticardiolipin IgG: <9 GPL U/mL (ref 0–14)
Anticardiolipin IgM: <9 [MPL'U]/mL (ref 0–12)

## 2022-11-05 LAB — LUPUS ANTICOAGULANT PANEL
DRVVT: 36 s (ref 0.0–47.0)
PTT Lupus Anticoagulant: 28 s (ref 0.0–43.5)

## 2022-11-07 ENCOUNTER — Telehealth: Payer: BC Managed Care – PPO | Admitting: Hematology and Oncology

## 2022-11-17 ENCOUNTER — Inpatient Hospital Stay: Payer: BC Managed Care – PPO | Attending: Hematology and Oncology | Admitting: Hematology and Oncology

## 2022-11-17 DIAGNOSIS — D500 Iron deficiency anemia secondary to blood loss (chronic): Secondary | ICD-10-CM | POA: Diagnosis not present

## 2022-11-17 DIAGNOSIS — I2699 Other pulmonary embolism without acute cor pulmonale: Secondary | ICD-10-CM

## 2022-11-17 NOTE — Assessment & Plan Note (Signed)
01/14/2017: Acute DVT left popliteal vein and posterior tibial vein (caused by being in a boot for leg injury) 08/16/2022: CT angiogram: Bilateral pulmonary emboli predominantly lower lobes   Inherited risk factors previously tested were negative except for elevated factor VIII   Acquired risk factors include: 1. Antiphospholipid antibody syndrome: Negative 2. Weight 3.  Progesterone hormone therapy  Slight microcytic anemia: Hemoglobin 9.6, MCV 79.4 I discussed with the patient that her main risk factors are her weight progesterone hormone replacement therapy and elevated factor VIII.

## 2022-11-17 NOTE — Progress Notes (Signed)
HEMATOLOGY-ONCOLOGY TELEPHONE VISIT PROGRESS NOTE  I connected with our patient on 11/17/22 at 11:30 AM EDT by telephone and verified that I am speaking with the correct person using two identifiers.  I discussed the limitations, risks, security and privacy concerns of performing an evaluation and management service by telephone and the availability of in person appointments.  I also discussed with the patient that there may be a patient responsible charge related to this service. The patient expressed understanding and agreed to proceed.   History of Present Illness: Follow-up to discuss results of blood work    REVIEW OF SYSTEMS:   Constitutional: Denies fevers, chills or abnormal weight loss All other systems were reviewed with the patient and are negative. Observations/Objective:     Assessment Plan:  Multiple pulmonary emboli (HCC) 01/14/2017: Acute DVT left popliteal vein and posterior tibial vein (caused by being in a boot for leg injury) 08/16/2022: CT angiogram: Bilateral pulmonary emboli predominantly lower lobes   Inherited risk factors previously tested were negative except for elevated factor VIII   Acquired risk factors include: 1. Antiphospholipid antibody syndrome: Negative 2. Weight 3.  Progesterone hormone therapy  Slight microcytic anemia: Hemoglobin 11.6, MCV 79.4 I discussed with the patient that her main risk factors are her weight progesterone hormone replacement therapy and elevated factor VIII.  In 3 months we will recheck with a D-dimer and if it is negative then she can discontinue anticoagulation.  If it is positive then she will remain on anticoagulation. I also instructed to take iron containing foods.  In 3 months we will check her iron studies as well.      I discussed the assessment and treatment plan with the patient. The patient was provided an opportunity to ask questions and all were answered. The patient agreed with the plan and demonstrated an  understanding of the instructions. The patient was advised to call back or seek an in-person evaluation if the symptoms worsen or if the condition fails to improve as anticipated.   I provided 12 minutes of non-face-to-face time during this encounter.  This includes time for charting and coordination of care   Tamsen Meek, MD  I Janan Ridge am acting as a scribe for Dr.Ronnie Mallette  I have reviewed the above documentation for accuracy and completeness, and I agree with the above.

## 2023-02-16 ENCOUNTER — Other Ambulatory Visit: Payer: Self-pay

## 2023-02-16 ENCOUNTER — Telehealth: Payer: Self-pay

## 2023-02-16 DIAGNOSIS — Z86718 Personal history of other venous thrombosis and embolism: Secondary | ICD-10-CM

## 2023-02-16 DIAGNOSIS — D500 Iron deficiency anemia secondary to blood loss (chronic): Secondary | ICD-10-CM

## 2023-02-16 DIAGNOSIS — I2699 Other pulmonary embolism without acute cor pulmonale: Secondary | ICD-10-CM

## 2023-02-16 NOTE — Progress Notes (Signed)
Pt called and asked about scheduling MD f/u with labs per last visit. She has been scheduled for labs this Wednesday 4 PM and MD f/u 02/23/23 MD telephone visit 0945.

## 2023-02-18 ENCOUNTER — Inpatient Hospital Stay: Payer: BC Managed Care – PPO | Attending: Hematology and Oncology

## 2023-02-18 ENCOUNTER — Other Ambulatory Visit: Payer: Self-pay

## 2023-02-18 DIAGNOSIS — N920 Excessive and frequent menstruation with regular cycle: Secondary | ICD-10-CM | POA: Diagnosis not present

## 2023-02-18 DIAGNOSIS — D509 Iron deficiency anemia, unspecified: Secondary | ICD-10-CM | POA: Diagnosis present

## 2023-02-18 DIAGNOSIS — Z86718 Personal history of other venous thrombosis and embolism: Secondary | ICD-10-CM | POA: Diagnosis not present

## 2023-02-18 DIAGNOSIS — Z7901 Long term (current) use of anticoagulants: Secondary | ICD-10-CM | POA: Insufficient documentation

## 2023-02-18 DIAGNOSIS — Z86711 Personal history of pulmonary embolism: Secondary | ICD-10-CM | POA: Insufficient documentation

## 2023-02-18 DIAGNOSIS — Z87891 Personal history of nicotine dependence: Secondary | ICD-10-CM | POA: Insufficient documentation

## 2023-02-18 DIAGNOSIS — D500 Iron deficiency anemia secondary to blood loss (chronic): Secondary | ICD-10-CM

## 2023-02-18 DIAGNOSIS — I2699 Other pulmonary embolism without acute cor pulmonale: Secondary | ICD-10-CM

## 2023-02-18 LAB — IRON AND IRON BINDING CAPACITY (CC-WL,HP ONLY)
Iron: 28 ug/dL (ref 28–170)
Saturation Ratios: 6 % — ABNORMAL LOW (ref 10.4–31.8)
TIBC: 490 ug/dL — ABNORMAL HIGH (ref 250–450)
UIBC: 462 ug/dL — ABNORMAL HIGH (ref 148–442)

## 2023-02-18 LAB — CBC WITH DIFFERENTIAL (CANCER CENTER ONLY)
Abs Immature Granulocytes: 0.02 10*3/uL (ref 0.00–0.07)
Basophils Absolute: 0 10*3/uL (ref 0.0–0.1)
Basophils Relative: 0 %
Eosinophils Absolute: 0.1 10*3/uL (ref 0.0–0.5)
Eosinophils Relative: 2 %
HCT: 35.7 % — ABNORMAL LOW (ref 36.0–46.0)
Hemoglobin: 10.7 g/dL — ABNORMAL LOW (ref 12.0–15.0)
Immature Granulocytes: 0 %
Lymphocytes Relative: 33 %
Lymphs Abs: 2.6 10*3/uL (ref 0.7–4.0)
MCH: 22.7 pg — ABNORMAL LOW (ref 26.0–34.0)
MCHC: 30 g/dL (ref 30.0–36.0)
MCV: 75.6 fL — ABNORMAL LOW (ref 80.0–100.0)
Monocytes Absolute: 0.5 10*3/uL (ref 0.1–1.0)
Monocytes Relative: 6 %
Neutro Abs: 4.7 10*3/uL (ref 1.7–7.7)
Neutrophils Relative %: 59 %
Platelet Count: 360 10*3/uL (ref 150–400)
RBC: 4.72 MIL/uL (ref 3.87–5.11)
RDW: 14.9 % (ref 11.5–15.5)
WBC Count: 7.9 10*3/uL (ref 4.0–10.5)
nRBC: 0 % (ref 0.0–0.2)

## 2023-02-18 LAB — D-DIMER, QUANTITATIVE: D-Dimer, Quant: 0.38 ug{FEU}/mL (ref 0.00–0.50)

## 2023-02-19 LAB — FERRITIN: Ferritin: 6 ng/mL — ABNORMAL LOW (ref 11–307)

## 2023-02-23 ENCOUNTER — Inpatient Hospital Stay (HOSPITAL_BASED_OUTPATIENT_CLINIC_OR_DEPARTMENT_OTHER): Payer: BC Managed Care – PPO | Admitting: Hematology and Oncology

## 2023-02-23 DIAGNOSIS — D500 Iron deficiency anemia secondary to blood loss (chronic): Secondary | ICD-10-CM | POA: Insufficient documentation

## 2023-02-23 DIAGNOSIS — I2699 Other pulmonary embolism without acute cor pulmonale: Secondary | ICD-10-CM | POA: Diagnosis not present

## 2023-02-23 NOTE — Assessment & Plan Note (Signed)
01/14/2017: Acute DVT left popliteal vein and posterior tibial vein (caused by being in a boot for leg injury) 08/16/2022: CT angiogram: Bilateral pulmonary emboli predominantly lower lobes   Inherited risk factors previously tested were negative except for elevated factor VIII   Acquired risk factors include: 1. Antiphospholipid antibody syndrome: Negative 2. Weight 3.  Progesterone hormone therapy I discussed with the patient that her main risk factors are her weight progesterone hormone replacement therapy and elevated factor VIII. D-dimer negative on 02/18/2023: Therefore patient can discontinue anticoagulation therapy at this time.    Iron deficiency anemia:  11/04/2022: Hemoglobin 11.6, MCV 79.4 02/18/2023: Hemoglobin 10.7, MCV 75.6, iron saturation 6%, TIBC 490, ferritin 6 Profound iron deficiency: Recommended IV iron therapy. Also recommended follow-up with gastroenterology and GYN to identify the source of blood loss.

## 2023-02-23 NOTE — Progress Notes (Signed)
HEMATOLOGY-ONCOLOGY TELEPHONE VISIT PROGRESS NOTE  I connected with our patient on 02/23/23 at  9:45 AM EST by telephone and verified that I am speaking with the correct person using two identifiers.  I discussed the limitations, risks, security and privacy concerns of performing an evaluation and management service by telephone and the availability of in person appointments.  I also discussed with the patient that there may be a patient responsible charge related to this service. The patient expressed understanding and agreed to proceed.   History of Present Illness: Follow-up to review the results of recent blood work  History of Present Illness   The patient, with a history of multiple blood clots and currently on blood thinners, presents with profound iron deficiency anemia. She reports heavy menstrual bleeding, which has worsened since starting blood thinners. She has been in discussion with her GYN about potential interventions, including birth control and ablation, but is hesitant due to concerns about the potential side effects of hormonal therapy. She has started taking an iron supplement in response to her recent lab results. She denies cravings for ice or chips, but does report constant fatigue.        REVIEW OF SYSTEMS:   Constitutional: Denies fevers, chills or abnormal weight loss All other systems were reviewed with the patient and are negative. Observations/Objective:     Assessment Plan:  Multiple pulmonary emboli (HCC) 01/14/2017: Acute DVT left popliteal vein and posterior tibial vein (caused by being in a boot for leg injury) 08/16/2022: CT angiogram: Bilateral pulmonary emboli predominantly lower lobes   Inherited risk factors previously tested were negative except for elevated factor VIII   Acquired risk factors include: 1. Antiphospholipid antibody syndrome: Negative 2. Weight 3.  Progesterone hormone therapy I discussed with the patient that her main risk factors  are her weight progesterone hormone replacement therapy and elevated factor VIII. D-dimer negative on 02/18/2023: Therefore patient can discontinue anticoagulation therapy at this time.    Iron deficiency anemia:  11/04/2022: Hemoglobin 11.6, MCV 79.4 02/18/2023: Hemoglobin 10.7, MCV 75.6, iron saturation 6%, TIBC 490, ferritin 6 Profound iron deficiency: Recommended IV iron therapy. We are hoping that since we are stopping the blood thinners are monthly menstrual IV bleeding will decrease.  We will recheck labs in 3 months and telephone visit after that.   I discussed the assessment and treatment plan with the patient. The patient was provided an opportunity to ask questions and all were answered. The patient agreed with the plan and demonstrated an understanding of the instructions. The patient was advised to call back or seek an in-person evaluation if the symptoms worsen or if the condition fails to improve as anticipated.   I provided 12 minutes of non-face-to-face time during this encounter.  This includes time for charting and coordination of care   Tamsen Meek, MD

## 2023-03-09 ENCOUNTER — Encounter: Payer: Self-pay | Admitting: Hematology and Oncology

## 2023-03-13 ENCOUNTER — Encounter: Payer: Self-pay | Admitting: Hematology and Oncology

## 2023-03-18 ENCOUNTER — Inpatient Hospital Stay: Payer: 59 | Attending: Internal Medicine

## 2023-03-18 VITALS — BP 133/87 | HR 75 | Temp 98.6°F | Resp 18

## 2023-03-18 DIAGNOSIS — Z87891 Personal history of nicotine dependence: Secondary | ICD-10-CM | POA: Insufficient documentation

## 2023-03-18 DIAGNOSIS — D509 Iron deficiency anemia, unspecified: Secondary | ICD-10-CM | POA: Diagnosis present

## 2023-03-18 DIAGNOSIS — Z86711 Personal history of pulmonary embolism: Secondary | ICD-10-CM | POA: Diagnosis not present

## 2023-03-18 DIAGNOSIS — Z7901 Long term (current) use of anticoagulants: Secondary | ICD-10-CM | POA: Diagnosis not present

## 2023-03-18 DIAGNOSIS — D500 Iron deficiency anemia secondary to blood loss (chronic): Secondary | ICD-10-CM

## 2023-03-18 DIAGNOSIS — Z86718 Personal history of other venous thrombosis and embolism: Secondary | ICD-10-CM | POA: Insufficient documentation

## 2023-03-18 DIAGNOSIS — N920 Excessive and frequent menstruation with regular cycle: Secondary | ICD-10-CM | POA: Diagnosis not present

## 2023-03-18 MED ORDER — SODIUM CHLORIDE 0.9% FLUSH
10.0000 mL | Freq: Two times a day (BID) | INTRAVENOUS | Status: DC
Start: 2023-03-18 — End: 2023-03-18

## 2023-03-18 MED ORDER — SODIUM CHLORIDE 0.9 % IV SOLN
300.0000 mg | Freq: Once | INTRAVENOUS | Status: AC
  Administered 2023-03-18: 300.0065 mg via INTRAVENOUS
  Filled 2023-03-18: qty 300

## 2023-03-18 MED ORDER — SODIUM CHLORIDE 0.9 % IV SOLN
INTRAVENOUS | Status: DC
Start: 2023-03-18 — End: 2023-03-18

## 2023-03-18 NOTE — Patient Instructions (Signed)
 Iron Sucrose Injection What is this medication? IRON SUCROSE (EYE ern SOO krose) treats low levels of iron (iron deficiency anemia) in people with kidney disease. Iron is a mineral that plays an important role in making red blood cells, which carry oxygen from your lungs to the rest of your body. This medicine may be used for other purposes; ask your health care provider or pharmacist if you have questions. COMMON BRAND NAME(S): Venofer What should I tell my care team before I take this medication? They need to know if you have any of these conditions: Anemia not caused by low iron levels Heart disease High levels of iron in the blood Kidney disease Liver disease An unusual or allergic reaction to iron, other medications, foods, dyes, or preservatives Pregnant or trying to get pregnant Breastfeeding How should I use this medication? This medication is for infusion into a vein. It is given in a hospital or clinic setting. Talk to your care team about the use of this medication in children. While this medication may be prescribed for children as young as 2 years for selected conditions, precautions do apply. Overdosage: If you think you have taken too much of this medicine contact a poison control center or emergency room at once. NOTE: This medicine is only for you. Do not share this medicine with others. What if I miss a dose? Keep appointments for follow-up doses. It is important not to miss your dose. Call your care team if you are unable to keep an appointment. What may interact with this medication? Do not take this medication with any of the following: Deferoxamine Dimercaprol Other iron products This medication may also interact with the following: Chloramphenicol Deferasirox This list may not describe all possible interactions. Give your health care provider a list of all the medicines, herbs, non-prescription drugs, or dietary supplements you use. Also tell them if you smoke,  drink alcohol, or use illegal drugs. Some items may interact with your medicine. What should I watch for while using this medication? Visit your care team regularly. Tell your care team if your symptoms do not start to get better or if they get worse. You may need blood work done while you are taking this medication. You may need to follow a special diet. Talk to your care team. Foods that contain iron include: whole grains/cereals, dried fruits, beans, or peas, leafy green vegetables, and organ meats (liver, kidney). What side effects may I notice from receiving this medication? Side effects that you should report to your care team as soon as possible: Allergic reactions--skin rash, itching, hives, swelling of the face, lips, tongue, or throat Low blood pressure--dizziness, feeling faint or lightheaded, blurry vision Shortness of breath Side effects that usually do not require medical attention (report to your care team if they continue or are bothersome): Flushing Headache Joint pain Muscle pain Nausea Pain, redness, or irritation at injection site This list may not describe all possible side effects. Call your doctor for medical advice about side effects. You may report side effects to FDA at 1-800-FDA-1088. Where should I keep my medication? This medication is given in a hospital or clinic. It will not be stored at home. NOTE: This sheet is a summary. It may not cover all possible information. If you have questions about this medicine, talk to your doctor, pharmacist, or health care provider.  2024 Elsevier/Gold Standard (2022-08-01 00:00:00)

## 2023-03-25 ENCOUNTER — Inpatient Hospital Stay: Payer: 59

## 2023-03-25 VITALS — BP 110/76 | HR 78 | Temp 98.3°F | Resp 18

## 2023-03-25 DIAGNOSIS — D509 Iron deficiency anemia, unspecified: Secondary | ICD-10-CM | POA: Diagnosis not present

## 2023-03-25 DIAGNOSIS — D500 Iron deficiency anemia secondary to blood loss (chronic): Secondary | ICD-10-CM

## 2023-03-25 MED ORDER — IRON SUCROSE 20 MG/ML IV SOLN
300.0000 mg | Freq: Once | INTRAVENOUS | Status: AC
  Administered 2023-03-25: 300 mg via INTRAVENOUS
  Filled 2023-03-25: qty 300

## 2023-03-25 MED ORDER — SODIUM CHLORIDE 0.9% FLUSH
10.0000 mL | Freq: Two times a day (BID) | INTRAVENOUS | Status: DC
Start: 2023-03-25 — End: 2023-03-25

## 2023-03-25 MED ORDER — SODIUM CHLORIDE 0.9 % IV SOLN
INTRAVENOUS | Status: DC
Start: 2023-03-25 — End: 2023-03-25

## 2023-03-25 NOTE — Patient Instructions (Signed)
 Iron Sucrose Injection What is this medication? IRON SUCROSE (EYE ern SOO krose) treats low levels of iron (iron deficiency anemia) in people with kidney disease. Iron is a mineral that plays an important role in making red blood cells, which carry oxygen from your lungs to the rest of your body. This medicine may be used for other purposes; ask your health care provider or pharmacist if you have questions. COMMON BRAND NAME(S): Venofer What should I tell my care team before I take this medication? They need to know if you have any of these conditions: Anemia not caused by low iron levels Heart disease High levels of iron in the blood Kidney disease Liver disease An unusual or allergic reaction to iron, other medications, foods, dyes, or preservatives Pregnant or trying to get pregnant Breastfeeding How should I use this medication? This medication is for infusion into a vein. It is given in a hospital or clinic setting. Talk to your care team about the use of this medication in children. While this medication may be prescribed for children as young as 2 years for selected conditions, precautions do apply. Overdosage: If you think you have taken too much of this medicine contact a poison control center or emergency room at once. NOTE: This medicine is only for you. Do not share this medicine with others. What if I miss a dose? Keep appointments for follow-up doses. It is important not to miss your dose. Call your care team if you are unable to keep an appointment. What may interact with this medication? Do not take this medication with any of the following: Deferoxamine Dimercaprol Other iron products This medication may also interact with the following: Chloramphenicol Deferasirox This list may not describe all possible interactions. Give your health care provider a list of all the medicines, herbs, non-prescription drugs, or dietary supplements you use. Also tell them if you smoke,  drink alcohol, or use illegal drugs. Some items may interact with your medicine. What should I watch for while using this medication? Visit your care team regularly. Tell your care team if your symptoms do not start to get better or if they get worse. You may need blood work done while you are taking this medication. You may need to follow a special diet. Talk to your care team. Foods that contain iron include: whole grains/cereals, dried fruits, beans, or peas, leafy green vegetables, and organ meats (liver, kidney). What side effects may I notice from receiving this medication? Side effects that you should report to your care team as soon as possible: Allergic reactions--skin rash, itching, hives, swelling of the face, lips, tongue, or throat Low blood pressure--dizziness, feeling faint or lightheaded, blurry vision Shortness of breath Side effects that usually do not require medical attention (report to your care team if they continue or are bothersome): Flushing Headache Joint pain Muscle pain Nausea Pain, redness, or irritation at injection site This list may not describe all possible side effects. Call your doctor for medical advice about side effects. You may report side effects to FDA at 1-800-FDA-1088. Where should I keep my medication? This medication is given in a hospital or clinic. It will not be stored at home. NOTE: This sheet is a summary. It may not cover all possible information. If you have questions about this medicine, talk to your doctor, pharmacist, or health care provider.  2024 Elsevier/Gold Standard (2022-08-01 00:00:00)

## 2023-03-25 NOTE — Progress Notes (Signed)
 Patient tolerated her iron  well. VSS- BP 110/76 (BP Location: Right Arm, Patient Position: Sitting)   Pulse 78   Temp 98.3 F (36.8 C) (Oral)   Resp 18   SpO2 99%   No s/s of any reaction. Stayed for her entire 30 minute observation.  Ambulatory to the lobby.

## 2023-04-01 ENCOUNTER — Inpatient Hospital Stay: Payer: 59

## 2023-04-01 VITALS — BP 129/81 | HR 72 | Temp 98.3°F | Resp 16

## 2023-04-01 DIAGNOSIS — D509 Iron deficiency anemia, unspecified: Secondary | ICD-10-CM | POA: Diagnosis not present

## 2023-04-01 DIAGNOSIS — D500 Iron deficiency anemia secondary to blood loss (chronic): Secondary | ICD-10-CM

## 2023-04-01 MED ORDER — SODIUM CHLORIDE 0.9% FLUSH: 10.0000 mL | Freq: Two times a day (BID) | INTRAVENOUS | Status: DC

## 2023-04-01 MED ORDER — SODIUM CHLORIDE 0.9 % IV SOLN
300.0000 mg | Freq: Once | INTRAVENOUS | Status: AC
  Administered 2023-04-01: 300 mg via INTRAVENOUS
  Filled 2023-04-01: qty 300

## 2023-04-01 NOTE — Patient Instructions (Signed)

## 2023-04-29 ENCOUNTER — Encounter: Payer: Self-pay | Admitting: Primary Care

## 2023-04-29 ENCOUNTER — Telehealth (INDEPENDENT_AMBULATORY_CARE_PROVIDER_SITE_OTHER): Payer: 59 | Admitting: Primary Care

## 2023-04-29 DIAGNOSIS — R1011 Right upper quadrant pain: Secondary | ICD-10-CM | POA: Diagnosis not present

## 2023-04-29 NOTE — Progress Notes (Signed)
Patient ID: Ariana Mcdaniel, female    DOB: [birth year], 48 y.o.   MRN: [id number]  Virtual visit completed through [employer], a video enabled telemedicine application. Due to national recommendations of social distancing due to COVID-19, a virtual visit is felt to be most appropriate for this patient at this time. Reviewed limitations, risks, security and privacy concerns of performing a virtual visit and the availability of in person appointments. I also reviewed that there may be a patient responsible charge related to this service. The patient agreed to proceed.   Patient location: home Provider location: [place] [facility], office Persons participating in this virtual visit: patient, provider   If any vitals were documented, they were collected by patient at home unless specified below.    LMP 04/21/2023 (Exact Date)    CC: Abdominal Pain Subjective:   HPI: Ariana Mcdaniel is a 48 y.o. female patient of Dr. Alphonsus Sias with a history of pulmonary emboli, DVT, GERD, insulin resistance, obesity presenting on 04/29/2023 for abdominal pain.   GI Problem (Patients reports two episodes of vomiting, diarrhea, acid reflux after she ate late a night both instances/Intermittent pain under right breast/)  Her pain is located to the RUQ of the abdomen which originally began about 2 months ago. Her pain is intermittent, occurring several times weekly on average.   Her most recent episodes occurred two weeks ago with RUQ abdomina pain, esophageal burning, vomiting, and diarrhea. Her first episode occurred at 2 am. Did eat around 8 pm, went to bed soon after. The second occurrence occurred last week, woke her from sleep, also ate soon before going to bed.   Since her most recent flares she's changed her diet by increasing green veggies, increasing fiber, reducing fried/fatty foods. Symptoms have improved.   No longer on apixaban, discontinued by hematology in December 2024.  She is compliant to her  omeprazole 20 mg daily.  She takes famotidine 10 mg only if needed.  Denies fevers, chills.       Relevant past medical, surgical, family and social history reviewed and updated as indicated. Interim medical history since our last visit reviewed. Allergies and medications reviewed and updated. Outpatient Medications Prior to Visit  Medication Sig Dispense Refill   cetirizine (ZYRTEC) 5 MG tablet Take 5 mg by mouth daily.     famotidine (PEPCID) 10 MG tablet Take 10 mg by mouth daily as needed.     omeprazole (PRILOSEC OTC) 20 MG tablet Take 20 mg by mouth daily.     Probiotic Product (PROBIOTIC DAILY PO) Take 1 tablet by mouth daily.     Simethicone (GAS-X PO) Take 1 tablet by mouth daily at 2 PM.     apixaban (ELIQUIS) 5 MG TABS tablet Take 1 tablet (5 mg total) by mouth 2 (two) times daily. (Patient not taking: Reported on 04/29/2023) 180 tablet 3   doxycycline (VIBRA-TABS) 100 MG tablet Take 1 tablet (100 mg total) by mouth 2 (two) times daily. (Patient not taking: Reported on 04/29/2023) 14 tablet 0   No facility-administered medications prior to visit.     Per HPI unless specifically indicated in ROS section below Review of Systems  Constitutional:  Negative for chills and fever.  Cardiovascular:  Negative for palpitations.  Gastrointestinal:  Positive for abdominal pain, diarrhea, nausea and vomiting.   Objective:  LMP 04/21/2023 (Exact Date)   Wt Readings from Last 3 Encounters:  09/29/22 248 lb (112.5 kg)  09/05/22 248 lb 12.8 oz (112.9 kg)  08/22/22 249 lb (112.9 kg)       Physical exam: General: Alert and oriented x 3, no distress, does not appear sickly  Pulmonary: Speaks in complete sentences without increased work of breathing, no cough during visit.  Psychiatric: Normal mood, thought content, and behavior.     Results for orders placed or performed in visit on 02/18/23  D-dimer, quantitative   Collection Time: 02/18/23  3:47 PM  Result Value Ref Range    D-Dimer, Quant 0.38 0.00 - 0.50 ug/mL-FEU  Iron and Iron Binding Capacity ([facility] only)   Collection Time: 02/18/23  3:47 PM  Result Value Ref Range   Iron 28 28 - 170 ug/dL   TIBC 161 (H) 096 - 045 ug/dL   Saturation Ratios 6 (L) 10.4 - 31.8 %   UIBC 462 (H) 148 - 442 ug/dL  Ferritin   Collection Time: 02/18/23  3:47 PM  Result Value Ref Range   Ferritin 6 (L) 11 - 307 ng/mL  CBC with Differential ([facility] Only)   Collection Time: 02/18/23  3:47 PM  Result Value Ref Range   WBC Count 7.9 4.0 - 10.5 K/uL   RBC 4.72 3.87 - 5.11 MIL/uL   Hemoglobin 10.7 (L) 12.0 - 15.0 g/dL   HCT 40.9 (L) 81.1 - 91.4 %   MCV 75.6 (L) 80.0 - 100.0 fL   MCH 22.7 (L) 26.0 - 34.0 pg   MCHC 30.0 30.0 - 36.0 g/dL   RDW 78.2 95.6 - 21.3 %   Platelet Count 360 150 - 400 K/uL   nRBC 0.0 0.0 - 0.2 %   Neutrophils Relative % 59 %   Neutro Abs 4.7 1.7 - 7.7 K/uL   Lymphocytes Relative 33 %   Lymphs Abs 2.6 0.7 - 4.0 K/uL   Monocytes Relative 6 %   Monocytes Absolute 0.5 0.1 - 1.0 K/uL   Eosinophils Relative 2 %   Eosinophils Absolute 0.1 0.0 - 0.5 K/uL   Basophils Relative 0 %   Basophils Absolute 0.0 0.0 - 0.1 K/uL   Immature Granulocytes 0 %   Abs Immature Granulocytes 0.02 0.00 - 0.07 K/uL   Assessment & Plan:   Problem List Items Addressed This Visit       Other   RUQ abdominal pain - Primary   Symptoms suspicious for cholelithiasis.  Lower suspicion for acute cholecystitis given improvement in symptoms.  Checking labs including CBC with differential, CMP.  She is requesting A1c.  Ordered and pending. Ultrasound of the right upper quadrant ordered and pending.  Discussed to continue to work on diet and avoiding irritative foods. We also discussed to avoid laying flat or going to bed within 2 hours after eating.  Continue omeprazole 20 mg daily for now.      Relevant Orders   US Abdomen Limited RUQ (LIVER/GB)   Comprehensive metabolic panel   CBC with Differential/Platelet    Hemoglobin A1c     No orders of the defined types were placed in this encounter.  Orders Placed This Encounter  Procedures   US Abdomen Limited RUQ (LIVER/GB)    48 year old female with history of right upper quadrant abdominal pain with nausea and vomiting.  Please evaluate gallbladder.    Standing Status:   Future    Expiration Date:   04/28/2024    Reason for Exam (SYMPTOM  OR DIAGNOSIS REQUIRED):   RUQ abdominal pain    Preferred imaging location?:   [facility]   Comprehensive metabolic  panel    Standing Status:   Future    Expiration Date:   04/28/2024   CBC with Differential/Platelet    Standing Status:   Future    Expiration Date:   04/28/2024   Hemoglobin A1c    Standing Status:   Future    Expiration Date:   04/28/2024    I discussed the assessment and treatment plan with the patient. The patient was provided an opportunity to ask questions and all were answered. The patient agreed with the plan and demonstrated an understanding of the instructions. The patient was advised to call back or seek an in-person evaluation if the symptoms worsen or if the condition fails to improve as anticipated.  Follow up plan:  You will receive a phone call regarding the ultrasound.  Schedule a lab appointment to have your labs drawn.  Continue to avoid greasy/spicy/fast foods.  I will be in touch soon!  Doreene Nest, NP

## 2023-04-29 NOTE — Assessment & Plan Note (Signed)
Symptoms suspicious for cholelithiasis.  Lower suspicion for acute cholecystitis given improvement in symptoms.  Checking labs including CBC with differential, CMP.  She is requesting A1c.  Ordered and pending. Ultrasound of the right upper quadrant ordered and pending.  Discussed to continue to work on diet and avoiding irritative foods. We also discussed to avoid laying flat or going to bed within 2 hours after eating.  Continue omeprazole 20 mg daily for now.

## 2023-04-29 NOTE — Patient Instructions (Signed)
You will receive a phone call regarding the ultrasound.  Schedule a lab appointment to have your labs drawn.  Continue to avoid greasy/spicy/fast foods.  I will be in touch soon!

## 2023-05-01 ENCOUNTER — Ambulatory Visit (HOSPITAL_COMMUNITY): Payer: 59

## 2023-05-22 ENCOUNTER — Other Ambulatory Visit (HOSPITAL_COMMUNITY)

## 2023-05-22 ENCOUNTER — Ambulatory Visit (HOSPITAL_COMMUNITY)
Admission: RE | Admit: 2023-05-22 | Discharge: 2023-05-22 | Disposition: A | Discharge status: Home / Self Care | Source: Ambulatory Visit | Attending: Primary Care | Admitting: Primary Care

## 2023-05-22 DIAGNOSIS — R1011 Right upper quadrant pain: Secondary | ICD-10-CM | POA: Diagnosis present

## 2023-05-23 DIAGNOSIS — R7303 Prediabetes: Secondary | ICD-10-CM

## 2023-06-01 ENCOUNTER — Inpatient Hospital Stay: Payer: BC Managed Care – PPO | Attending: Internal Medicine

## 2023-06-01 DIAGNOSIS — N920 Excessive and frequent menstruation with regular cycle: Secondary | ICD-10-CM | POA: Diagnosis not present

## 2023-06-01 DIAGNOSIS — Z87891 Personal history of nicotine dependence: Secondary | ICD-10-CM | POA: Insufficient documentation

## 2023-06-01 DIAGNOSIS — Z86718 Personal history of other venous thrombosis and embolism: Secondary | ICD-10-CM | POA: Insufficient documentation

## 2023-06-01 DIAGNOSIS — D509 Iron deficiency anemia, unspecified: Secondary | ICD-10-CM | POA: Insufficient documentation

## 2023-06-01 DIAGNOSIS — Z7901 Long term (current) use of anticoagulants: Secondary | ICD-10-CM | POA: Insufficient documentation

## 2023-06-01 DIAGNOSIS — Z86711 Personal history of pulmonary embolism: Secondary | ICD-10-CM | POA: Diagnosis not present

## 2023-06-01 DIAGNOSIS — I2699 Other pulmonary embolism without acute cor pulmonale: Secondary | ICD-10-CM

## 2023-06-01 DIAGNOSIS — D500 Iron deficiency anemia secondary to blood loss (chronic): Secondary | ICD-10-CM

## 2023-06-01 LAB — CBC WITH DIFFERENTIAL (CANCER CENTER ONLY)
Abs Immature Granulocytes: 0.01 10*3/uL (ref 0.00–0.07)
Basophils Absolute: 0 10*3/uL (ref 0.0–0.1)
Basophils Relative: 0 %
Eosinophils Absolute: 0.1 10*3/uL (ref 0.0–0.5)
Eosinophils Relative: 2 %
HCT: 40.4 % (ref 36.0–46.0)
Hemoglobin: 13.1 g/dL (ref 12.0–15.0)
Immature Granulocytes: 0 %
Lymphocytes Relative: 32 %
Lymphs Abs: 2.3 10*3/uL (ref 0.7–4.0)
MCH: 26.3 pg (ref 26.0–34.0)
MCHC: 32.4 g/dL (ref 30.0–36.0)
MCV: 81 fL (ref 80.0–100.0)
Monocytes Absolute: 0.5 10*3/uL (ref 0.1–1.0)
Monocytes Relative: 7 %
Neutro Abs: 4.1 10*3/uL (ref 1.7–7.7)
Neutrophils Relative %: 59 %
Platelet Count: 280 10*3/uL (ref 150–400)
RBC: 4.99 MIL/uL (ref 3.87–5.11)
RDW: 17.6 % — ABNORMAL HIGH (ref 11.5–15.5)
WBC Count: 7 10*3/uL (ref 4.0–10.5)
nRBC: 0 % (ref 0.0–0.2)

## 2023-06-01 LAB — IRON AND IRON BINDING CAPACITY (CC-WL,HP ONLY)
Iron: 43 ug/dL (ref 28–170)
Saturation Ratios: 12 % (ref 10.4–31.8)
TIBC: 347 ug/dL (ref 250–450)
UIBC: 304 ug/dL (ref 148–442)

## 2023-06-01 LAB — FERRITIN: Ferritin: 48 ng/mL (ref 11–307)

## 2023-06-01 LAB — D-DIMER, QUANTITATIVE: D-Dimer, Quant: 0.41 ug{FEU}/mL (ref 0.00–0.50)

## 2023-06-03 ENCOUNTER — Inpatient Hospital Stay (HOSPITAL_BASED_OUTPATIENT_CLINIC_OR_DEPARTMENT_OTHER): Payer: BC Managed Care – PPO | Admitting: Hematology and Oncology

## 2023-06-03 DIAGNOSIS — D500 Iron deficiency anemia secondary to blood loss (chronic): Secondary | ICD-10-CM

## 2023-06-03 DIAGNOSIS — I2699 Other pulmonary embolism without acute cor pulmonale: Secondary | ICD-10-CM | POA: Diagnosis not present

## 2023-06-03 NOTE — Progress Notes (Signed)
 HEMATOLOGY-ONCOLOGY TELEPHONE VISIT PROGRESS NOTE  I connected with our patient on 06/03/23 at  8:15 AM EDT by telephone and verified that I am speaking with the correct person using two identifiers.  I discussed the limitations, risks, security and privacy concerns of performing an evaluation and management service by telephone and the availability of in person appointments.  I also discussed with the patient that there may be a patient responsible charge related to this service. The patient expressed understanding and agreed to proceed.   History of Present Illness: Telephone follow-up to discuss the results of iron studies and ferritin  History of Present Illness The patient, with a history of anemia, presents for a follow-up after completing IV iron therapy in January. She reports an improvement in energy levels and strength. The patient also had a negative D-dimer test, alleviating concerns about blood clots. The patient's menstrual cycles have lightened since coming off blood thinners.   REVIEW OF SYSTEMS:   Constitutional: Denies fevers, chills or abnormal weight loss All other systems were reviewed with the patient and are negative. Observations/Objective:     Assessment Plan:  Multiple pulmonary emboli (HCC) 01/14/2017: Acute DVT left popliteal vein and posterior tibial vein (caused by being in a boot for leg injury) 08/16/2022: CT angiogram: Bilateral pulmonary emboli predominantly lower lobes   Inherited risk factors previously tested were negative except for elevated factor VIII   Acquired risk factors include: 1. Weight 2. Progesterone hormone therapy I discussed with the patient that her main risk factors are her weight progesterone hormone replacement therapy and elevated factor VIII. D-dimer negative on 02/18/2023: discontinued anticoagulation therapy    Iron deficiency anemia due to chronic blood loss 11/04/2022: Hemoglobin 11.6, MCV 79.4 02/18/2023: Hemoglobin 10.7, MCV  75.6, iron saturation 6%, TIBC 490, ferritin 6 06/01/2023: Hemoglobin 13, MCV 81, iron saturation 12%, ferritin 48  IV iron therapy.:  January 2025 We are hoping that since we are stopping the blood thinners are monthly menstrual IV bleeding will decrease.   We will recheck labs in 3 months and telephone visit after that.       I discussed the assessment and treatment plan with the patient. The patient was provided an opportunity to ask questions and all were answered. The patient agreed with the plan and demonstrated an understanding of the instructions. The patient was advised to call back or seek an in-person evaluation if the symptoms worsen or if the condition fails to improve as anticipated.   I provided 20 minutes of non-face-to-face time during this encounter.  This includes time for charting and coordination of care   Tamsen Meek, MD

## 2023-06-03 NOTE — Assessment & Plan Note (Signed)
 01/14/2017: Acute DVT left popliteal vein and posterior tibial vein (caused by being in a boot for leg injury) 08/16/2022: CT angiogram: Bilateral pulmonary emboli predominantly lower lobes   Inherited risk factors previously tested were negative except for elevated factor VIII   Acquired risk factors include: 1. Weight 2. Progesterone hormone therapy I discussed with the patient that her main risk factors are her weight progesterone hormone replacement therapy and elevated factor VIII. D-dimer negative on 02/18/2023: discontinued anticoagulation therapy

## 2023-06-03 NOTE — Assessment & Plan Note (Signed)
 11/04/2022: Hemoglobin 11.6, MCV 79.4 02/18/2023: Hemoglobin 10.7, MCV 75.6, iron saturation 6%, TIBC 490, ferritin 6 06/01/2023: Hemoglobin 13, MCV 81, iron saturation 12%, ferritin 48  IV iron therapy.:  January 2025 We are hoping that since we are stopping the blood thinners are monthly menstrual IV bleeding will decrease.   We will recheck labs in 3 months and telephone visit after that.

## 2023-06-04 ENCOUNTER — Telehealth: Payer: Self-pay | Admitting: Hematology and Oncology

## 2023-06-04 NOTE — Telephone Encounter (Signed)
 Scheduled appointments per 3/26 los. Talked with the patient and she is aware of the made appointments.

## 2023-06-16 ENCOUNTER — Other Ambulatory Visit (INDEPENDENT_AMBULATORY_CARE_PROVIDER_SITE_OTHER)

## 2023-06-16 DIAGNOSIS — R1011 Right upper quadrant pain: Secondary | ICD-10-CM

## 2023-06-16 LAB — COMPREHENSIVE METABOLIC PANEL WITH GFR
ALT: 64 U/L — ABNORMAL HIGH (ref 0–35)
AST: 32 U/L (ref 0–37)
Albumin: 3.8 g/dL (ref 3.5–5.2)
Alkaline Phosphatase: 74 U/L (ref 39–117)
BUN: 14 mg/dL (ref 6–23)
CO2: 26 meq/L (ref 19–32)
Calcium: 8.7 mg/dL (ref 8.4–10.5)
Chloride: 105 meq/L (ref 96–112)
Creatinine, Ser: 0.7 mg/dL (ref 0.40–1.20)
GFR: 104.48 mL/min (ref >60.00)
Glucose, Bld: 122 mg/dL — ABNORMAL HIGH (ref 70–99)
Potassium: 4.1 meq/L (ref 3.5–5.1)
Sodium: 140 meq/L (ref 135–145)
Total Bilirubin: 0.3 mg/dL (ref 0.2–1.2)
Total Protein: 6.8 g/dL (ref 6.0–8.3)

## 2023-06-16 LAB — HEMOGLOBIN A1C: Hgb A1c MFr Bld: 5.8 % (ref 4.6–6.5)

## 2023-08-31 ENCOUNTER — Other Ambulatory Visit

## 2023-09-01 NOTE — Assessment & Plan Note (Signed)
 11/04/2022: Hemoglobin 11.6, MCV 79.4 02/18/2023: Hemoglobin 10.7, MCV 75.6, iron saturation 6%, TIBC 490, ferritin 6 06/01/2023: Hemoglobin 13, MCV 81, iron saturation 12%, ferritin 48  IV iron therapy.:  January 2025 We are hoping that since we are stopping the blood thinners are monthly menstrual IV bleeding will decrease.   We will recheck labs in 3 months and telephone visit after that.

## 2023-09-01 NOTE — Assessment & Plan Note (Signed)
 03/16/2016: Acute DVT left popliteal vein and posterior tibial vein (caused by being in a boot for leg injury) 08/16/2022: CT angiogram: Bilateral pulmonary emboli predominantly lower lobes   Inherited risk factors previously tested were negative except for elevated factor VIII   Acquired risk factors include: 1. Weight 2. Progesterone hormone therapy I discussed with the patient that her main risk factors are her weight progesterone hormone replacement therapy and elevated factor VIII. D-dimer negative on 02/18/2023: discontinued anticoagulation therapy

## 2023-09-02 ENCOUNTER — Inpatient Hospital Stay (HOSPITAL_BASED_OUTPATIENT_CLINIC_OR_DEPARTMENT_OTHER): Admitting: Hematology and Oncology

## 2023-09-02 ENCOUNTER — Inpatient Hospital Stay: Attending: Hematology and Oncology

## 2023-09-02 DIAGNOSIS — Z87891 Personal history of nicotine dependence: Secondary | ICD-10-CM | POA: Diagnosis not present

## 2023-09-02 DIAGNOSIS — I2699 Other pulmonary embolism without acute cor pulmonale: Secondary | ICD-10-CM

## 2023-09-02 DIAGNOSIS — Z86711 Personal history of pulmonary embolism: Secondary | ICD-10-CM | POA: Diagnosis not present

## 2023-09-02 DIAGNOSIS — Z86718 Personal history of other venous thrombosis and embolism: Secondary | ICD-10-CM | POA: Diagnosis not present

## 2023-09-02 DIAGNOSIS — Z7901 Long term (current) use of anticoagulants: Secondary | ICD-10-CM | POA: Insufficient documentation

## 2023-09-02 DIAGNOSIS — N920 Excessive and frequent menstruation with regular cycle: Secondary | ICD-10-CM | POA: Diagnosis not present

## 2023-09-02 DIAGNOSIS — D509 Iron deficiency anemia, unspecified: Secondary | ICD-10-CM | POA: Diagnosis present

## 2023-09-02 DIAGNOSIS — D500 Iron deficiency anemia secondary to blood loss (chronic): Secondary | ICD-10-CM

## 2023-09-02 LAB — IRON AND IRON BINDING CAPACITY (CC-WL,HP ONLY)
Iron: 66 ug/dL (ref 28–170)
Saturation Ratios: 17 % (ref 10.4–31.8)
TIBC: 391 ug/dL (ref 250–450)
UIBC: 325 ug/dL (ref 148–442)

## 2023-09-02 LAB — CBC WITH DIFFERENTIAL (CANCER CENTER ONLY)
Abs Immature Granulocytes: 0.04 10*3/uL (ref 0.00–0.07)
Basophils Absolute: 0.1 10*3/uL (ref 0.0–0.1)
Basophils Relative: 1 %
Eosinophils Absolute: 0.2 10*3/uL (ref 0.0–0.5)
Eosinophils Relative: 2 %
HCT: 41.5 % (ref 36.0–46.0)
Hemoglobin: 13.7 g/dL (ref 12.0–15.0)
Immature Granulocytes: 0 %
Lymphocytes Relative: 26 %
Lymphs Abs: 2.5 10*3/uL (ref 0.7–4.0)
MCH: 27.6 pg (ref 26.0–34.0)
MCHC: 33 g/dL (ref 30.0–36.0)
MCV: 83.5 fL (ref 80.0–100.0)
Monocytes Absolute: 0.7 10*3/uL (ref 0.1–1.0)
Monocytes Relative: 7 %
Neutro Abs: 6.3 10*3/uL (ref 1.7–7.7)
Neutrophils Relative %: 64 %
Platelet Count: 319 10*3/uL (ref 150–400)
RBC: 4.97 MIL/uL (ref 3.87–5.11)
RDW: 12.8 % (ref 11.5–15.5)
WBC Count: 9.7 10*3/uL (ref 4.0–10.5)
nRBC: 0 % (ref 0.0–0.2)

## 2023-09-02 LAB — FERRITIN: Ferritin: 55 ng/mL (ref 11–307)

## 2023-09-02 NOTE — Progress Notes (Unsigned)
 Error

## 2023-09-03 ENCOUNTER — Encounter: Payer: Self-pay | Admitting: Hematology and Oncology

## 2023-09-03 ENCOUNTER — Inpatient Hospital Stay (HOSPITAL_BASED_OUTPATIENT_CLINIC_OR_DEPARTMENT_OTHER): Admitting: Hematology and Oncology

## 2023-09-03 DIAGNOSIS — N920 Excessive and frequent menstruation with regular cycle: Secondary | ICD-10-CM

## 2023-09-03 DIAGNOSIS — D500 Iron deficiency anemia secondary to blood loss (chronic): Secondary | ICD-10-CM

## 2023-09-03 NOTE — Assessment & Plan Note (Signed)
 11/04/2022: Hemoglobin 11.6, MCV 79.4 02/18/2023: Hemoglobin 10.7, MCV 75.6, iron  saturation 6%, TIBC 490, ferritin 6 06/01/2023: Hemoglobin 13, MCV 81, iron  saturation 12%, ferritin 48 09/02/2023: Hemoglobin 13.7, MCV 83.5, TIBC 17%, ferritin 55,   IV iron  therapy.:  January 2025 We are hoping that since we are stopping the blood thinners are monthly menstrual IV bleeding will decrease.   We will recheck labs in 3 months and telephone visit after that.

## 2023-09-03 NOTE — Progress Notes (Signed)
 HEMATOLOGY-ONCOLOGY TELEPHONE VISIT PROGRESS NOTE  I connected with our patient on 09/03/23 at  3:00 PM EDT by telephone and verified that I am speaking with the correct person using two identifiers.  I discussed the limitations, risks, security and privacy concerns of performing an evaluation and management service by telephone and the availability of in person appointments.  I also discussed with the patient that there may be a patient responsible charge related to this service. The patient expressed understanding and agreed to proceed.   History of Present Illness: Follow-up of iron  deficiency anemia  History of Present Illness Ariana Mcdaniel is a 48 year old female with iron  deficiency anemia who presents for follow-up of her iron  levels.  She experiences iron  deficiency anemia due to heavy menstrual cycles and blood thinner use. Her ferritin level remains stable at 55, and hemoglobin is at 13.7, indicating well-controlled anemia. No new issues with iron  levels are present.      REVIEW OF SYSTEMS:   Constitutional: Denies fevers, chills or abnormal weight loss All other systems were reviewed with the patient and are negative. Observations/Objective:     Assessment Plan:  Iron  deficiency anemia due to chronic blood loss 11/04/2022: Hemoglobin 11.6, MCV 79.4 02/18/2023: Hemoglobin 10.7, MCV 75.6, iron  saturation 6%, TIBC 490, ferritin 6 06/01/2023: Hemoglobin 13, MCV 81, iron  saturation 12%, ferritin 48 09/02/2023: Hemoglobin 13.7, MCV 83.5, TIBC 17%, ferritin 55,   IV iron  therapy.:  January 2025 Since she stopped the blood thinner she is no longer having heavy cycle bleeding and therefore she is no longer anemic at this time. Therefore we do not need to see her on a regular basis. She can be seen on an as-needed basis.    I discussed the assessment and treatment plan with the patient. The patient was provided an opportunity to ask questions and all were answered. The patient agreed  with the plan and demonstrated an understanding of the instructions. The patient was advised to call back or seek an in-person evaluation if the symptoms worsen or if the condition fails to improve as anticipated.   I provided 11 minutes of non-face-to-face time during this encounter.  This includes time for charting and coordination of care   Naomi MARLA Chad, MD

## 2023-10-27 ENCOUNTER — Encounter (INDEPENDENT_AMBULATORY_CARE_PROVIDER_SITE_OTHER): Payer: Self-pay

## 2023-11-02 ENCOUNTER — Telehealth: Payer: Self-pay

## 2023-11-02 NOTE — Telephone Encounter (Signed)
 Copied from CRM #[id number]. Topic: Appointments - Scheduling Inquiry for Clinic >> Nov 02, 2023  2:12 PM Mesmerise C wrote: Reason for CRM: Patient trying to set appointment with Comer Gaskins about skin issues advised her not taking new patients, stated that she was told could be able to accept her, offered to send message inquired about other providers gave next available time for [facility] on 9/24 patient wanted later in the day advised none for later in the day after that next appointment is in October patient stated she didn't want to wait and will call around, before trying to offer other options patient disconnected

## 2023-11-03 ENCOUNTER — Ambulatory Visit: Admitting: Internal Medicine

## 2023-11-03 ENCOUNTER — Encounter: Payer: Self-pay | Admitting: Internal Medicine

## 2023-11-03 VITALS — BP 110/70 | HR 84 | Temp 98.4°F | Ht 62.0 in | Wt 269.0 lb

## 2023-11-03 DIAGNOSIS — L821 Other seborrheic keratosis: Secondary | ICD-10-CM | POA: Diagnosis not present

## 2023-11-03 NOTE — Assessment & Plan Note (Signed)
 Reassured No action needed about this Did recommend establishing with dermatologist for routine care

## 2023-11-03 NOTE — Progress Notes (Signed)
 Subjective:    Patient ID: Ariana Mcdaniel, female    DOB: 15-Apr-1975, 48 y.o.   MRN: [id number]  HPI Here to check a skin lesion Tried to get dermatology appointment and needs referral  Noticed the lesion about 6 months Now bigger and raised Left scapula area No burning, pain or sensation Dark lesion  Current Outpatient Medications on File Prior to Visit  Medication Sig Dispense Refill   cetirizine (ZYRTEC) 5 MG tablet Take 5 mg by mouth daily.     famotidine (PEPCID) 10 MG tablet Take 10 mg by mouth daily as needed.     omeprazole (PRILOSEC OTC) 20 MG tablet Take 20 mg by mouth daily.     Probiotic Product (PROBIOTIC DAILY PO) Take 1 tablet by mouth daily.     Simethicone (GAS-X PO) Take 1 tablet by mouth daily at 2 PM.     No current facility-administered medications on file prior to visit.    Allergies  Allergen Reactions   Wellbutrin [Bupropion] Hives   Amoxicillin      itching   Clindamycin /Lincomycin     Caused a superinfection   Oxycodone Hives   Oxycodone-Acetaminophen  Hives    Past Medical History:  Diagnosis Date   Allergic rhinitis due to pollen    DVT (deep venous thrombosis) (HCC) 01/2017   Fatty liver    GERD (gastroesophageal reflux disease)    Mastodynia    Palpitations    PONV (postoperative nausea and vomiting)    Renal stones     Past Surgical History:  Procedure Laterality Date   BREAST BIOPSY Bilateral    BREAST EXCISIONAL BIOPSY Left 04/2020   BREAST LUMPECTOMY WITH RADIOACTIVE SEED LOCALIZATION Left 04/25/2020   Procedure: LEFT BREAST LUMPECTOMY WITH RADIOACTIVE SEED LOCALIZATION;  Surgeon: Vanderbilt Ned, MD;  Location: [facility];  Service: General;  Laterality: Left;   TONSILLECTOMY     WISDOM TOOTH EXTRACTION      Family History  Problem Relation Age of Onset   Thyroid  disease Mother    High blood pressure Father    High Cholesterol Father    Celiac disease Sister    Celiac disease Sister    Hypertension  Maternal Grandmother    Diabetes Maternal Grandmother    Stroke Maternal Grandmother    Depression Paternal Grandmother    Cancer Paternal Grandfather        non-hodgkin's lymphoma   Heart disease Neg Hx    Colon cancer Neg Hx    Esophageal cancer Neg Hx    Pancreatic cancer Neg Hx    Stomach cancer Neg Hx     Social History   Socioeconomic History   Marital status: Married    Spouse name: Prentice   Number of children: 2   Years of education: Not on file   Highest education level: Not on file  Occupational History   Occupation: Courtroom Database administrator: [employer]  Tobacco Use   Smoking status: Former    Current packs/day: 0.00    Types: Cigarettes    Quit date: 01/04/2010    Years since quitting: 13.8   Smokeless tobacco: Never  Vaping Use   Vaping status: Never Used  Substance and Sexual Activity   Alcohol use: Not Currently    Comment: 3-4 beers per week   Drug use: No   Sexual activity: Yes    Partners: Male    Birth control/protection: None    Comment: husband had Vasectomy  Other Topics Concern  Not on file  Social History Narrative   2nd marriage   Social Drivers of Corporate investment banker Strain: Not on file  Food Insecurity: Not on file  Transportation Needs: Not on file  Physical Activity: Not on file  Stress: Not on file  Social Connections: Not on file  Intimate Partner Violence: Not on file   Review of Systems     Objective:   Physical Exam Skin:    Comments: ~66mm seb keratosis over left scapula            Assessment & Plan:

## 2024-01-19 ENCOUNTER — Ambulatory Visit: Payer: Self-pay

## 2024-01-19 ENCOUNTER — Ambulatory Visit
Admission: RE | Admit: 2024-01-19 | Discharge: 2024-01-19 | Disposition: A | Source: Ambulatory Visit | Attending: General Practice | Admitting: General Practice

## 2024-01-19 ENCOUNTER — Ambulatory Visit: Admitting: General Practice

## 2024-01-19 ENCOUNTER — Encounter: Payer: Self-pay | Admitting: General Practice

## 2024-01-19 ENCOUNTER — Ambulatory Visit: Payer: Self-pay | Admitting: General Practice

## 2024-01-19 VITALS — BP 124/82 | HR 101 | Temp 99.1°F | Ht 62.0 in | Wt 267.0 lb

## 2024-01-19 DIAGNOSIS — R051 Acute cough: Secondary | ICD-10-CM

## 2024-01-19 DIAGNOSIS — J40 Bronchitis, not specified as acute or chronic: Secondary | ICD-10-CM

## 2024-01-19 DIAGNOSIS — Z86711 Personal history of pulmonary embolism: Secondary | ICD-10-CM | POA: Diagnosis not present

## 2024-01-19 LAB — POC COVID19 BINAXNOW: SARS Coronavirus 2 Ag: NEGATIVE

## 2024-01-19 LAB — POCT INFLUENZA A/B
Influenza A, POC: NEGATIVE
Influenza B, POC: NEGATIVE

## 2024-01-19 MED ORDER — BENZONATATE 200 MG PO CAPS: 200.0000 mg | ORAL_CAPSULE | Freq: Three times a day (TID) | ORAL | 0 refills | Status: AC | PRN

## 2024-01-19 MED ORDER — DOXYCYCLINE HYCLATE 100 MG PO TABS: 100.0000 mg | ORAL_TABLET | Freq: Two times a day (BID) | ORAL | 0 refills | Status: AC

## 2024-01-19 MED ORDER — PREDNISONE 20 MG PO TABS: 40.0000 mg | ORAL_TABLET | Freq: Every day | ORAL | 0 refills | Status: AC

## 2024-01-19 NOTE — Telephone Encounter (Signed)
FYI for appt this afternoon

## 2024-01-19 NOTE — Patient Instructions (Signed)
 Stop by the lab prior to leaving today. I will notify you of your results once received.  Complete xray(s) prior to leaving today. I will notify you of your results once received.  Start Benzonatate  capsules for cough. Take 1 capsule by mouth three times daily as needed for cough.  Start Doxycycline  antibiotic for the infection. Take 1 tablet by mouth twice daily for 7 days.   Start prednisone  20 mg tablets. Take 2 tablets by mouth once daily in the morning for 5 days.  Follow up if symptoms worsen or do not improve.   It was a pleasure to see you today!

## 2024-01-19 NOTE — Telephone Encounter (Signed)
 FYI Only or Action Required?: This afternoon  Patient was last seen in primary care on 11/03/2023 by Jimmy Charlie FERNS, MD.  Called Nurse Triage reporting Cough fever, comes and goes over the past 2 weeks  Symptoms began 2 weeks ago.  Interventions attempted: OTC medications:  SABRA  Symptoms are: unchanged.  Triage Disposition: See Physician Within 24 Hours  Patient/caregiver understands and will follow disposition?: Yes                Copied from CRM [id number]. Topic: Clinical - Red Word Triage >> Jan 19, 2024  7:51 AM Suzen RAMAN wrote: Red Word that prompted transfer to Nurse Triage: on and off fever,sore throat ,congestion, chest tightness requesting an appt Reason for Disposition  Fever present > 3 days (72 hours)  Answer Assessment - Initial Assessment Questions 1. ONSET: When did the cough begin?      2 weeks 2. SEVERITY: How bad is the cough today?      Every 10 minutes 3. SPUTUM: Describe the color of your sputum (e.g., none, dry cough; clear, white, yellow, green)     Dry cough 4. HEMOPTYSIS: Are you coughing up any blood? If Yes, ask: How much? (e.g., flecks, streaks, tablespoons, etc.)     no 5. DIFFICULTY BREATHING: Are you having difficulty breathing? If Yes, ask: How bad is it? (e.g., mild, moderate, severe)      Chest tightness 6. FEVER: Do you have a fever? If Yes, ask: What is your temperature, how was it measured, and when did it start?     Fever comes and goes 7. CARDIAC HISTORY: Do you have any history of heart disease? (e.g., heart attack, congestive heart failure)      no 8. LUNG HISTORY: Do you have any history of lung disease?  (e.g., pulmonary embolus, asthma, emphysema)     PE in lungs several years 10. OTHER SYMPTOMS: Do you have any other symptoms? (e.g., runny nose, wheezing, chest pain)       Swollen glands, had a sore throat.  Protocols used: Cough - Acute Non-Productive-A-AH

## 2024-01-19 NOTE — Progress Notes (Signed)
 Established Patient Office Visit  Subjective   Patient ID: Ariana Mcdaniel, female    DOB: September 18, 1975  Age: 48 y.o. MRN: [id number]  Chief Complaint  Patient presents with   Cough    With fever, congestion, sore throat, fatigue, body aches, and chills since 01/06/24. Patient has been taking several OTC meds but currently on sudafed and advil . Patient has not done any at home testing.     Cough Associated symptoms include chills, ear pain, a fever, headaches, a sore throat, shortness of breath and wheezing. Pertinent negatives include no chest pain or heartburn.    Ariana Mcdaniel is a 48 year old female, patient of Dr. Jimmy, presents today for an acute visit.   Discussed the use of AI scribe software for clinical note transcription with the patient, who gave verbal consent to proceed.  History of Present Illness Ariana Mcdaniel is a 48 year old female with a history of pulmonary embolism who presents with persistent respiratory symptoms.  Her symptoms began around October 29th with fever, body aches, chills, congestion, sore throat, and fatigue. Initially, there was some improvement, but the symptoms returned, starting with severe chest pain, described as tightness, and shortness of breath. She has not experienced substantial coughing.  Her symptoms worsened again on a Saturday with a severe sore throat, which improved when her postnasal drip dried up. She has been taking Zyrtec daily for the postnasal drip. She has a history of pulmonary embolism and expresses concern about her respiratory symptoms. She has not been following up with pulmonology as she was previously cleared of the condition.  She reports ear pain in both ears, congestion, sinus pain primarily in the maxillary region, and a sore throat. She has a cough with minimal clear sputum production, shortness of breath, wheezing, fevers, chills, fatigue, dizziness, and headaches. No nausea, diarrhea, or urinary symptoms.  She has  been experiencing her menstrual period for eleven days, which was preceded by a seventeen-day delay. She attributes this to perimenopause and is reluctant to take progesterone due to her history of blood clots.  She has not taken over-the-counter medications such as Sudafed and Advil  for this illness. She is also taking Zyrtec daily.  She is not a smoker.    Patient Active Problem List   Diagnosis Date Noted   History of pulmonary embolus (PE) 01/19/2024   Seborrheic keratosis 11/03/2023   RUQ abdominal pain 04/29/2023   Iron  deficiency anemia due to chronic blood loss 02/23/2023   Multiple pulmonary emboli (HCC) 08/22/2022   History of DVT (deep vein thrombosis) 08/16/2022   Dog bite 06/05/2022   Acute non-recurrent frontal sinusitis 06/21/2021   Gastroesophageal reflux disease 10/08/2020   Diarrhea 08/20/2020   Insulin  resistance 01/12/2020   Depression 05/23/2019   Class 3 severe obesity with serious comorbidity and body mass index (BMI) of 40.0 to 44.9 in adult (HCC) 05/05/2019   Vitamin D  deficiency 04/21/2019   Prediabetes 04/21/2019   Palpitations 08/28/2017   Menorrhagia with regular cycle 11/17/2016   Bronchitis 09/05/2013   Allergic rhinitis due to pollen    Past Medical History:  Diagnosis Date   Allergic rhinitis due to pollen    DVT (deep venous thrombosis) (HCC) 01/2017   Fatty liver    GERD (gastroesophageal reflux disease)    Mastodynia    Palpitations    PONV (postoperative nausea and vomiting)    Renal stones    Past Surgical History:  Procedure Laterality Date   BREAST BIOPSY  Bilateral    BREAST EXCISIONAL BIOPSY Left 04/2020   BREAST LUMPECTOMY WITH RADIOACTIVE SEED LOCALIZATION Left 04/25/2020   Procedure: LEFT BREAST LUMPECTOMY WITH RADIOACTIVE SEED LOCALIZATION;  Surgeon: Vanderbilt Ned, MD;  Location: [facility];  Service: General;  Laterality: Left;   TONSILLECTOMY     WISDOM TOOTH EXTRACTION     Allergies  Allergen Reactions    Wellbutrin [Bupropion] Hives   Amoxicillin      itching   Clindamycin /Lincomycin     Caused a superinfection   Oxycodone Hives   Oxycodone-Acetaminophen  Hives         01/19/2024    3:26 PM 11/03/2023   11:06 AM 04/29/2023   12:54 PM  Depression screen PHQ 2/9  Decreased Interest 0 0 0  Down, Depressed, Hopeless 0 0 0  PHQ - 2 Score 0 0 0  Altered sleeping 0    Tired, decreased energy 0    Change in appetite 0    Feeling bad or failure about yourself  0    Trouble concentrating 0    Moving slowly or fidgety/restless 0    Suicidal thoughts 0    PHQ-9 Score 0    Difficult doing work/chores Not difficult at all         01/19/2024    3:26 PM  GAD 7 : Generalized Anxiety Score  Nervous, Anxious, on Edge 0  Control/stop worrying 0  Worry too much - different things 0  Trouble relaxing 0  Restless 0  Easily annoyed or irritable 0  Afraid - awful might happen 0  Total GAD 7 Score 0  Anxiety Difficulty Not difficult at all      Review of Systems  Constitutional:  Positive for chills, fever and malaise/fatigue.  HENT:  Positive for congestion, ear pain, sinus pain and sore throat.   Respiratory:  Positive for cough, sputum production, shortness of breath and wheezing.   Cardiovascular:  Negative for chest pain.       Due to coughing  Gastrointestinal:  Negative for abdominal pain, constipation, diarrhea, heartburn, nausea and vomiting.  Genitourinary:  Negative for dysuria, frequency and urgency.  Neurological:  Positive for dizziness and headaches.  Endo/Heme/Allergies:  Negative for polydipsia.  Psychiatric/Behavioral:  Negative for depression and suicidal ideas. The patient is not nervous/anxious.       Objective:     BP 124/82   Pulse (!) 101   Temp 99.1 F (37.3 C) (Temporal)   Ht 5' 2 (1.575 m)   Wt 267 lb (121.1 kg)   LMP 01/19/2024 (Exact Date) Comment: Pt denies pregnancy  SpO2 98%   BMI 48.83 kg/m  BP Readings from Last 3 Encounters:  01/19/24  124/82  11/03/23 110/70  04/01/23 129/81   Wt Readings from Last 3 Encounters:  01/19/24 267 lb (121.1 kg)  11/03/23 269 lb (122 kg)  09/29/22 248 lb (112.5 kg)      Physical Exam Vitals and nursing note reviewed.  Constitutional:      Appearance: Normal appearance.  HENT:     Right Ear: Tympanic membrane, ear canal and external ear normal.     Left Ear: Tympanic membrane, ear canal and external ear normal.     Nose: Congestion and rhinorrhea present.     Mouth/Throat:     Mouth: Mucous membranes are moist.  Eyes:     Conjunctiva/sclera: Conjunctivae normal.  Cardiovascular:     Rate and Rhythm: Normal rate and regular rhythm.     Pulses: Normal pulses.  Heart sounds: Normal heart sounds.  Pulmonary:     Effort: Pulmonary effort is normal.     Breath sounds: Normal breath sounds.  Neurological:     Mental Status: She is alert and oriented to person, place, and time.  Psychiatric:        Mood and Affect: Mood normal.        Behavior: Behavior normal.        Thought Content: Thought content normal.        Judgment: Judgment normal.      Results for orders placed or performed in visit on 01/19/24  POC COVID-19  Result Value Ref Range   SARS Coronavirus 2 Ag Negative Negative  POCT Influenza A/B  Result Value Ref Range   Influenza A, POC Negative Negative   Influenza B, POC Negative Negative       The ASCVD Risk score (Arnett DK, et al., 2019) failed to calculate for the following reasons:   Cannot find a previous HDL lab   Cannot find a previous total cholesterol lab    Assessment & Plan:  Bronchitis -     predniSONE ; Take 2 tablets (40 mg total) by mouth daily for 5 days.  Dispense: 10 tablet; Refill: 0 -     Benzonatate ; Take 1 capsule (200 mg total) by mouth 3 (three) times daily as needed.  Dispense: 20 capsule; Refill: 0 -     Doxycycline  Hyclate; Take 1 tablet (100 mg total) by mouth 2 (two) times daily for 7 days.  Dispense: 14 tablet; Refill:  0  History of pulmonary embolus (PE) -     CBC -     D-dimer, quantitative -     Basic metabolic panel with GFR  Acute cough -     POC COVID-19 BinaxNow -     POCT Influenza A/B -     DG Chest 2 View     Assessment and Plan Assessment & Plan Acute bronchitis with cough and wheezing Acute bronchitis with bacterial involvement, fever, and clear sputum. Differential includes pneumonia. Allergic to most antibiotics, tolerates doxycycline . - Negative flu and covid test today. - Ordered chest x-ray to evaluate for pneumonia. - Prescribed prednisone , 2 tablets daily for 5 days, for wheezing. - Prescribed Tessalon  Perles for cough, up to 3 times a day. - Prescribed doxycycline  for bacterial infection. - Ordered CBC and D-dimer tests. - Advised ER visit for chest pain, shortness of breath, difficulty breathing, or hoarseness.  History of pulmonary embolism Concern for PE recurrence due to respiratory symptoms. Previous PE possibly linked to birth control. No pulmonology follow-up. - Ordered D-dimer test to assess for PE recurrence. - Advised ER visit for chest pain, shortness of breath, difficulty breathing, or hoarseness.    Return if symptoms worsen or fail to improve.    Carrol Aurora, NP

## 2024-01-19 NOTE — Telephone Encounter (Signed)
 Noted

## 2024-01-20 ENCOUNTER — Encounter: Payer: Self-pay | Admitting: Internal Medicine

## 2024-01-20 ENCOUNTER — Ambulatory Visit
Admission: RE | Admit: 2024-01-20 | Discharge: 2024-01-20 | Disposition: A | Discharge status: Home / Self Care | Source: Ambulatory Visit | Attending: Internal Medicine | Admitting: Internal Medicine

## 2024-01-20 ENCOUNTER — Telehealth: Payer: Self-pay | Admitting: Internal Medicine

## 2024-01-20 DIAGNOSIS — R7989 Other specified abnormal findings of blood chemistry: Secondary | ICD-10-CM

## 2024-01-20 DIAGNOSIS — Z86711 Personal history of pulmonary embolism: Secondary | ICD-10-CM

## 2024-01-20 LAB — BASIC METABOLIC PANEL WITH GFR
BUN: 8 mg/dL (ref 6–23)
CO2: 28 meq/L (ref 19–32)
Calcium: 8.6 mg/dL (ref 8.4–10.5)
Chloride: 103 meq/L (ref 96–112)
Creatinine, Ser: 0.72 mg/dL (ref 0.40–1.20)
GFR: 100.59 mL/min (ref >60.00)
Glucose, Bld: 103 mg/dL — ABNORMAL HIGH (ref 70–99)
Potassium: 3.8 meq/L (ref 3.5–5.1)
Sodium: 138 meq/L (ref 135–145)

## 2024-01-20 LAB — CBC
HCT: 38.2 % (ref 36.0–46.0)
Hemoglobin: 12.7 g/dL (ref 12.0–15.0)
MCHC: 33.2 g/dL (ref 30.0–36.0)
MCV: 77.4 fl — ABNORMAL LOW (ref 78.0–100.0)
Platelets: 309 K/uL (ref 150.0–400.0)
RBC: 4.94 Mil/uL (ref 3.87–5.11)
RDW: 15.2 % (ref 11.5–15.5)
WBC: 9 K/uL (ref 4.0–10.5)

## 2024-01-20 LAB — D-DIMER, QUANTITATIVE: D-Dimer, Quant: 0.83 ug{FEU}/mL — ABNORMAL HIGH (ref <0.50)

## 2024-01-20 MED ORDER — IOPAMIDOL (ISOVUE-370) INJECTION 76%
75.0000 mL | Freq: Once | INTRAVENOUS | Status: AC | PRN
  Administered 2024-01-20: 75 mL via INTRAVENOUS

## 2024-01-20 NOTE — Telephone Encounter (Signed)
 Note sent to B Vincente NP, Vincente pool and will speak with B Vincente NP or Harlene CMA.

## 2024-01-20 NOTE — Telephone Encounter (Signed)
 Call from access nurse with critical result d-dimer positive. Review of chart indicates that this patient should have CT angio ordered due to hx PE and new symptoms which could be associated with PE. Scan ordered. Mychart message sent to patient will route to treating provider.

## 2024-01-20 NOTE — Telephone Encounter (Signed)
 Per imaging tab pt is scheduled 01/20/24 at 2 PM for CT angio chest with and without Cm.

## 2024-01-20 NOTE — Telephone Encounter (Signed)
 Noted and reviewed notes from Dr. Rollene, who has already reached out to the patient and patient is scheduled for CT scan.

## 2024-01-20 NOTE — Telephone Encounter (Signed)
 SABRA

## 2024-01-21 ENCOUNTER — Ambulatory Visit: Payer: Self-pay | Admitting: Internal Medicine

## 2024-03-16 ENCOUNTER — Telehealth: Payer: Self-pay | Admitting: Internal Medicine

## 2024-03-16 NOTE — Telephone Encounter (Signed)
-----   Message from Carrol Aurora, NP sent at 01/19/2024  4:07 PM EST ----- Regarding: toc Patient has been approved by Mallie for [facility] from Dr. Jimmy.   She has been having trouble scheduling an appointment with her due to miscommunication from [place].   Can we get her scheduled for her TOC with Mallie please?   Thank you.  Jenette Aurora, DNP, AGNP-C 01/19/2024 4:08 PM

## 2024-03-16 NOTE — Telephone Encounter (Signed)
 Lvm for patient to call office to schedule an appointment with Mallie Gaskins
